# Patient Record
Sex: Female | Born: 1940 | Race: White | Hispanic: No | Marital: Married | Smoking: Former smoker
Health system: Southern US, Community
[De-identification: ages and names within clinical notes are randomized; demographics above are authoritative.]

## PROBLEM LIST (undated history)

## (undated) DIAGNOSIS — J449 Chronic obstructive pulmonary disease, unspecified: Secondary | ICD-10-CM

## (undated) DIAGNOSIS — I471 Supraventricular tachycardia, unspecified: Secondary | ICD-10-CM

## (undated) DIAGNOSIS — I341 Nonrheumatic mitral (valve) prolapse: Secondary | ICD-10-CM

## (undated) DIAGNOSIS — K559 Vascular disorder of intestine, unspecified: Secondary | ICD-10-CM

## (undated) DIAGNOSIS — K589 Irritable bowel syndrome without diarrhea: Secondary | ICD-10-CM

## (undated) DIAGNOSIS — K219 Gastro-esophageal reflux disease without esophagitis: Secondary | ICD-10-CM

## (undated) DIAGNOSIS — C569 Malignant neoplasm of unspecified ovary: Secondary | ICD-10-CM

## (undated) DIAGNOSIS — D696 Thrombocytopenia, unspecified: Secondary | ICD-10-CM

## (undated) DIAGNOSIS — D693 Immune thrombocytopenic purpura: Secondary | ICD-10-CM

## (undated) DIAGNOSIS — A0472 Enterocolitis due to Clostridium difficile, not specified as recurrent: Secondary | ICD-10-CM

## (undated) HISTORY — DX: Supraventricular tachycardia, unspecified: I47.10

## (undated) HISTORY — DX: Nonrheumatic mitral (valve) prolapse: I34.1

## (undated) HISTORY — PX: OTHER SURGICAL HISTORY: SHX169

## (undated) HISTORY — DX: Immune thrombocytopenic purpura: D69.3

## (undated) HISTORY — DX: Vascular disorder of intestine, unspecified: K55.9

## (undated) HISTORY — DX: Supraventricular tachycardia: I47.1

## (undated) HISTORY — DX: Malignant neoplasm of unspecified ovary: C56.9

## (undated) HISTORY — PX: ABDOMINAL HYSTERECTOMY: SHX81

## (undated) HISTORY — DX: Thrombocytopenia, unspecified: D69.6

## (undated) HISTORY — DX: Enterocolitis due to Clostridium difficile, not specified as recurrent: A04.72

## (undated) HISTORY — DX: Gastro-esophageal reflux disease without esophagitis: K21.9

## (undated) HISTORY — PX: CHOLECYSTECTOMY: SHX55

## (undated) HISTORY — DX: Irritable bowel syndrome, unspecified: K58.9

## (undated) HISTORY — DX: Chronic obstructive pulmonary disease, unspecified: J44.9

---

## 2004-04-05 ENCOUNTER — Ambulatory Visit: Payer: Self-pay | Admitting: General Practice

## 2004-08-16 ENCOUNTER — Ambulatory Visit: Payer: Self-pay | Admitting: Unknown Physician Specialty

## 2005-03-06 ENCOUNTER — Encounter: Payer: Self-pay | Admitting: Otolaryngology

## 2005-03-12 ENCOUNTER — Encounter: Payer: Self-pay | Admitting: Otolaryngology

## 2005-08-17 ENCOUNTER — Ambulatory Visit: Payer: Self-pay | Admitting: Unknown Physician Specialty

## 2006-10-29 ENCOUNTER — Ambulatory Visit: Payer: Self-pay | Admitting: Internal Medicine

## 2006-10-31 ENCOUNTER — Ambulatory Visit: Payer: Self-pay | Admitting: Internal Medicine

## 2006-12-04 ENCOUNTER — Ambulatory Visit: Payer: Self-pay | Admitting: *Deleted

## 2007-03-25 ENCOUNTER — Ambulatory Visit: Payer: Self-pay | Admitting: Unknown Physician Specialty

## 2007-05-07 ENCOUNTER — Ambulatory Visit: Payer: Self-pay | Admitting: Internal Medicine

## 2007-10-31 ENCOUNTER — Ambulatory Visit: Payer: Self-pay | Admitting: Internal Medicine

## 2008-06-09 ENCOUNTER — Ambulatory Visit: Payer: Self-pay | Admitting: Medical

## 2008-12-10 ENCOUNTER — Ambulatory Visit: Payer: Self-pay | Admitting: Oncology

## 2009-01-06 ENCOUNTER — Ambulatory Visit: Payer: Self-pay | Admitting: Internal Medicine

## 2009-01-10 ENCOUNTER — Ambulatory Visit: Payer: Self-pay | Admitting: Oncology

## 2009-01-14 ENCOUNTER — Ambulatory Visit: Payer: Self-pay | Admitting: Oncology

## 2009-02-10 ENCOUNTER — Ambulatory Visit: Payer: Self-pay | Admitting: Oncology

## 2009-03-03 ENCOUNTER — Ambulatory Visit: Payer: Self-pay | Admitting: Unknown Physician Specialty

## 2009-03-26 ENCOUNTER — Ambulatory Visit: Payer: Self-pay | Admitting: Surgery

## 2009-04-06 ENCOUNTER — Ambulatory Visit: Payer: Self-pay | Admitting: Unknown Physician Specialty

## 2009-06-12 DIAGNOSIS — A0472 Enterocolitis due to Clostridium difficile, not specified as recurrent: Secondary | ICD-10-CM

## 2009-06-12 HISTORY — DX: Enterocolitis due to Clostridium difficile, not specified as recurrent: A04.72

## 2009-07-13 ENCOUNTER — Ambulatory Visit: Payer: Self-pay | Admitting: Oncology

## 2009-07-15 ENCOUNTER — Ambulatory Visit: Payer: Self-pay | Admitting: Oncology

## 2009-08-10 ENCOUNTER — Ambulatory Visit: Payer: Self-pay | Admitting: Oncology

## 2009-09-06 ENCOUNTER — Ambulatory Visit: Payer: Self-pay | Admitting: Surgery

## 2009-09-13 ENCOUNTER — Ambulatory Visit: Payer: Self-pay | Admitting: Surgery

## 2009-11-17 LAB — HM PAP SMEAR: HM Pap smear: NORMAL

## 2010-02-24 ENCOUNTER — Ambulatory Visit: Payer: Self-pay | Admitting: Internal Medicine

## 2010-05-23 ENCOUNTER — Emergency Department: Payer: Self-pay | Admitting: Emergency Medicine

## 2010-05-27 ENCOUNTER — Inpatient Hospital Stay: Payer: Self-pay | Admitting: Specialist

## 2010-06-12 DIAGNOSIS — K559 Vascular disorder of intestine, unspecified: Secondary | ICD-10-CM

## 2010-06-12 HISTORY — DX: Vascular disorder of intestine, unspecified: K55.9

## 2010-06-15 ENCOUNTER — Inpatient Hospital Stay: Payer: Self-pay | Admitting: Internal Medicine

## 2010-06-17 LAB — PATHOLOGY REPORT

## 2010-06-28 ENCOUNTER — Other Ambulatory Visit: Payer: Self-pay | Admitting: Unknown Physician Specialty

## 2010-07-18 ENCOUNTER — Ambulatory Visit: Payer: Self-pay | Admitting: Unknown Physician Specialty

## 2010-11-22 LAB — HM COLONOSCOPY

## 2010-12-28 ENCOUNTER — Emergency Department: Payer: Self-pay | Admitting: Emergency Medicine

## 2011-01-24 ENCOUNTER — Encounter: Payer: Self-pay | Admitting: Internal Medicine

## 2011-02-22 ENCOUNTER — Encounter: Payer: Self-pay | Admitting: Internal Medicine

## 2011-05-17 ENCOUNTER — Ambulatory Visit: Payer: Self-pay | Admitting: Internal Medicine

## 2011-05-24 ENCOUNTER — Ambulatory Visit (INDEPENDENT_AMBULATORY_CARE_PROVIDER_SITE_OTHER): Payer: Medicare Other | Admitting: Internal Medicine

## 2011-05-24 ENCOUNTER — Encounter: Payer: Self-pay | Admitting: Internal Medicine

## 2011-05-24 VITALS — BP 130/66 | HR 72 | Temp 97.7°F | Wt 137.0 lb

## 2011-05-24 DIAGNOSIS — E785 Hyperlipidemia, unspecified: Secondary | ICD-10-CM

## 2011-05-24 DIAGNOSIS — K589 Irritable bowel syndrome without diarrhea: Secondary | ICD-10-CM

## 2011-05-24 DIAGNOSIS — M810 Age-related osteoporosis without current pathological fracture: Secondary | ICD-10-CM

## 2011-05-24 DIAGNOSIS — G629 Polyneuropathy, unspecified: Secondary | ICD-10-CM

## 2011-05-24 DIAGNOSIS — G43909 Migraine, unspecified, not intractable, without status migrainosus: Secondary | ICD-10-CM | POA: Insufficient documentation

## 2011-05-24 DIAGNOSIS — G589 Mononeuropathy, unspecified: Secondary | ICD-10-CM

## 2011-05-24 DIAGNOSIS — D696 Thrombocytopenia, unspecified: Secondary | ICD-10-CM | POA: Insufficient documentation

## 2011-05-24 DIAGNOSIS — G4700 Insomnia, unspecified: Secondary | ICD-10-CM

## 2011-05-24 LAB — CBC WITH DIFFERENTIAL/PLATELET
Basophils Absolute: 0 10*3/uL (ref 0.0–0.1)
Eosinophils Absolute: 0.2 10*3/uL (ref 0.0–0.7)
HCT: 41.1 % (ref 36.0–46.0)
Hemoglobin: 14 g/dL (ref 12.0–15.0)
Lymphocytes Relative: 38.1 % (ref 12.0–46.0)
Lymphs Abs: 1.7 10*3/uL (ref 0.7–4.0)
MCHC: 34.1 g/dL (ref 30.0–36.0)
MCV: 94.8 fl (ref 78.0–100.0)
Monocytes Absolute: 0.3 10*3/uL (ref 0.1–1.0)
Neutro Abs: 2.2 10*3/uL (ref 1.4–7.7)
RDW: 12.6 % (ref 11.5–14.6)

## 2011-05-24 LAB — COMPREHENSIVE METABOLIC PANEL
ALT: 20 U/L (ref 0–35)
AST: 30 U/L (ref 0–37)
Calcium: 9.9 mg/dL (ref 8.4–10.5)
Chloride: 109 mEq/L (ref 96–112)
Creatinine, Ser: 0.9 mg/dL (ref 0.4–1.2)

## 2011-05-24 MED ORDER — ZOLPIDEM TARTRATE 5 MG PO TABS: 2.5000 mg | ORAL_TABLET | Freq: Every evening | ORAL | Status: DC | PRN

## 2011-05-24 MED ORDER — TOPIRAMATE 25 MG PO TABS: 25.0000 mg | ORAL_TABLET | Freq: Every day | ORAL | Status: DC

## 2011-05-24 MED ORDER — TRAMADOL HCL 50 MG PO TABS: 50.0000 mg | ORAL_TABLET | Freq: Four times a day (QID) | ORAL | Status: DC | PRN

## 2011-05-24 NOTE — Progress Notes (Signed)
Subjective:    Patient ID: Amanda Robbins, female    DOB: January 07, 1941, 70 y.o.   MRN: [id number]  HPI 70 year old female with a history of irritable bowel syndrome, migraines, insomnia presents for followup. Her primary concern today is pain in her bilateral lower extremities. She notes that this has been present for several months. She describes the pain as at the end of pressure or burning around her feet which is made worse with increased activity. She denies any pain in her calves. She notes some numbness in both of her lower legs. She also notes occasional weakness in her left lower extremity and notes that she has strep on several occasions as a result of this. She has not been evaluated by neurology. She was evaluated by a podiatrist who diagnosed neuropathy. She reports that another practitioner had performed some lab work including light level which was normal. She has never had EMG studies. She has not had wounds on her legs. She denies swelling of her lower extremities. She tried a course of Neurontin with no improvement in her symptoms. She also receives samples of Lyrica with no improvement.  She also notes a history of IBS. She was hospitalized for several days last year with a severe episode of IBS. She notes that she has to modify her diet and limited intake of certain foods to help control episodes of diarrhea. She notes that GI has scheduled her for repeat endoscopy next year. She is not currently having any diarrhea or nausea.  In regards to her insomnia, she notes that her symptoms are very well-controlled with Ambien. She requests refill today.  Today, we reviewed her recent bone density testing. This shows an improvement over a two-year period with the use of Reclast. She questions whether there may be alternative medications that she could use instead of the Reclast.  Outpatient Encounter Prescriptions as of 05/24/2011  Medication Sig Dispense Refill  . Calcium Carb-Cholecalciferol  (CALCIUM 1000 + D PO) Take by mouth daily.        Marland Kitchen dexlansoprazole (DEXILANT) 60 MG capsule Take 60 mg by mouth daily.        . Multiple Vitamin (MULTIVITAMIN) tablet Take 1 tablet by mouth daily.        . rizatriptan (MAXALT) 10 MG tablet Take 10 mg by mouth as needed. May repeat in 2 hours if needed       . topiramate (TOPAMAX) 25 MG tablet Take 1-2 tablets (25-50 mg total) by mouth daily.  60 tablet  6  . zolpidem (AMBIEN) 5 MG tablet Take 0.5-1 tablets (2.5-5 mg total) by mouth at bedtime as needed.  30 tablet  3    Review of Systems  Constitutional: Negative for fever, chills, appetite change, fatigue and unexpected weight change.  HENT: Negative for ear pain, congestion, sore throat, trouble swallowing, neck pain, voice change and sinus pressure.   Eyes: Negative for visual disturbance.  Respiratory: Negative for cough, shortness of breath, wheezing and stridor.   Cardiovascular: Negative for chest pain, palpitations and leg swelling.  Gastrointestinal: Positive for abdominal pain (intermittent), diarrhea and constipation. Negative for nausea, vomiting, blood in stool, abdominal distention and anal bleeding.  Genitourinary: Negative for dysuria and flank pain.  Musculoskeletal: Positive for myalgias and arthralgias. Negative for gait problem.  Skin: Negative for color change and rash.  Neurological: Positive for weakness and numbness. Negative for dizziness and headaches.  Hematological: Negative for adenopathy. Does not bruise/bleed easily.  Psychiatric/Behavioral: Negative for suicidal ideas, sleep  disturbance and dysphoric mood. The patient is not nervous/anxious.        Objective:   Physical Exam  Constitutional: She is oriented to person, place, and time. She appears well-developed and well-nourished. No distress.  HENT:  Head: Normocephalic and atraumatic.  Right Ear: External ear normal.  Left Ear: External ear normal.  Nose: Nose normal.  Mouth/Throat: Oropharynx is  clear and moist. No oropharyngeal exudate.  Eyes: Conjunctivae are normal. Pupils are equal, round, and reactive to light. Right eye exhibits no discharge. Left eye exhibits no discharge. No scleral icterus.  Neck: Normal range of motion. Neck supple. No tracheal deviation present. No thyromegaly present.  Cardiovascular: Normal rate, regular rhythm, normal heart sounds and intact distal pulses.  Exam reveals no gallop and no friction rub.   No murmur heard. Pulmonary/Chest: Effort normal and breath sounds normal. No respiratory distress. She has no wheezes. She has no rales. She exhibits no tenderness.  Abdominal: Soft. Bowel sounds are normal. She exhibits no distension and no mass. There is no tenderness. There is no rebound and no guarding.  Musculoskeletal: Normal range of motion. She exhibits no edema and no tenderness.  Lymphadenopathy:    She has no cervical adenopathy.  Neurological: She is alert and oriented to person, place, and time. She displays no atrophy. A sensory deficit (light touch limited distal left lower leg) is present. No cranial nerve deficit. She exhibits normal muscle tone. Coordination and gait normal.  Skin: Skin is warm and dry. No rash noted. She is not diaphoretic. No erythema. No pallor.  Psychiatric: She has a normal mood and affect. Her behavior is normal. Judgment and thought content normal.          Assessment & Plan:  1. Neuropathy - symptoms and exam are concerning for progressive neuropathy. Will set up referral to neurology for EMG testing. She has tried both Neurontin and Lyrica in the past with no improvement. She has had some improvement with tramadol so will give refill on this medication today. She will followup here in 3 months.   2. IBS - Symptoms have improved compared with last year. Will continue to monitor. Will obtain records from GI.  3. Insomnia - Symptoms well controlled on ambien.  Refill on ambien today.  4. Osteoporosis - reviewed  recent bone density testing. Will look into her insurance coverage for Prolia. If she is approved, we'll start Prolia injection as soon as possible. She will need repeat bone density studies in 2014.

## 2011-05-29 ENCOUNTER — Ambulatory Visit (INDEPENDENT_AMBULATORY_CARE_PROVIDER_SITE_OTHER): Payer: Medicare Other | Admitting: Neurology

## 2011-05-29 ENCOUNTER — Other Ambulatory Visit (INDEPENDENT_AMBULATORY_CARE_PROVIDER_SITE_OTHER): Payer: Medicare Other

## 2011-05-29 ENCOUNTER — Encounter: Payer: Self-pay | Admitting: Neurology

## 2011-05-29 ENCOUNTER — Other Ambulatory Visit: Payer: Self-pay | Admitting: Neurology

## 2011-05-29 DIAGNOSIS — R7309 Other abnormal glucose: Secondary | ICD-10-CM

## 2011-05-29 DIAGNOSIS — G609 Hereditary and idiopathic neuropathy, unspecified: Secondary | ICD-10-CM

## 2011-05-29 LAB — COMPREHENSIVE METABOLIC PANEL
Alkaline Phosphatase: 52 U/L (ref 39–117)
BUN: 22 mg/dL (ref 6–23)
Glucose, Bld: 102 mg/dL — ABNORMAL HIGH (ref 70–99)
Total Bilirubin: 0.9 mg/dL (ref 0.3–1.2)

## 2011-05-29 LAB — CBC WITH DIFFERENTIAL/PLATELET
Basophils Relative: 0.4 % (ref 0.0–3.0)
Eosinophils Relative: 3.6 % (ref 0.0–5.0)
HCT: 40 % (ref 36.0–46.0)
Lymphs Abs: 1.5 10*3/uL (ref 0.7–4.0)
MCHC: 35.2 g/dL (ref 30.0–36.0)
MCV: 94.2 fl (ref 78.0–100.0)
Monocytes Absolute: 0.2 10*3/uL (ref 0.1–1.0)
Platelets: 119 10*3/uL — ABNORMAL LOW (ref 150.0–400.0)
RBC: 4.25 Mil/uL (ref 3.87–5.11)
WBC: 4 10*3/uL — ABNORMAL LOW (ref 4.5–10.5)

## 2011-05-29 LAB — VITAMIN B12: Vitamin B-12: 1016 pg/mL — ABNORMAL HIGH (ref 211–911)

## 2011-05-29 LAB — SEDIMENTATION RATE: Sed Rate: 10 mm/hr (ref 0–22)

## 2011-05-29 LAB — HEMOGLOBIN A1C: Hgb A1c MFr Bld: 5.3 % (ref 4.6–6.5)

## 2011-05-29 LAB — TSH: TSH: 2.28 u[IU]/mL (ref 0.35–5.50)

## 2011-05-29 MED ORDER — GABAPENTIN 300 MG PO CAPS: ORAL_CAPSULE | ORAL | Status: DC

## 2011-05-29 NOTE — Progress Notes (Signed)
Dear Dr. Dan Humphreys,  Thank you for having me see Amanda Robbins in consultation today at [facility] for her problem with possible peripheral neuropathy.  As you may recall, she is a 70 y.o. 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  She feels it started in the right foot and progressed to the left.  She says that she gets some numbness in her right hand, but has attributed this to a recent wrist fracture.  She tried Neurontin 100 tid but did not get any relief.  She did not try a prescription for Lyrica due to its listed side effects.  She denies dysesthesias or paraesthesias.  She feels disabled by the neuropathy as it makes it hard for her to walk for long periods of time.    Past Medical History  Diagnosis Date  . 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 Irritable bowel   . C. difficile colitis 2011  . Allergic rhinitis     Followed by Dr. Ray Callas  . Esophageal reflux     Followed by GI physician, on Dexilant and Ranitidine  . Migraine   . 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 Osteoporosis 2009    on Reclast once yearly, Followed by Dr. Gavin Potters  - no history of diabetes  Past Surgical History  Procedure Date  . Bil roken wrists   . Gallbladder removed   . Bilateral bunionectomies   . Cesarean section     History   Social History  . Marital Status: Married    Spouse Name: N/A    Number of Children: N/A  . Years of Education: N/A   Social History Main Topics  . Smoking status: Former Smoker    Quit date: 06/12/1989  . Smokeless tobacco: Never Used  . Alcohol Use: No  . Drug Use: No  . Sexually Active: None   Other Topics Concern  . None   Social History Narrative  . None   - no history of neuropathies in the family.  Current Outpatient Prescriptions on File Prior to Visit  Medication Sig  Dispense Refill  . Calcium Carb-Cholecalciferol (CALCIUM 1000 + D PO) Take by mouth daily.        Marland Kitchen dexlansoprazole (DEXILANT) 60 MG capsule Take 60 mg by mouth daily.        Marland Kitchen gabapentin (NEURONTIN) 100 MG capsule Take 100 mg by mouth 3 (three) times daily as needed.        . Multiple Vitamin (MULTIVITAMIN) tablet Take 1 tablet by mouth daily.        . rizatriptan (MAXALT) 10 MG tablet Take 10 mg by mouth as needed. May repeat in 2 hours if needed       . topiramate (TOPAMAX) 25 MG tablet Take 1-2 tablets (25-50 mg total) by mouth daily.  60 tablet  6  . traMADol (ULTRAM) 50 MG tablet Take 1 tablet (50 mg total) by mouth every 6 (six) hours as needed.  30 tablet  6  . zoledronic acid (RECLAST) 5 MG/100ML SOLN Inject 5 mg into the vein once. yearly       . zolpidem (AMBIEN) 5 MG tablet Take 0.5-1 tablets (2.5-5 mg total) by mouth at bedtime as needed.  30 tablet  3    Allergies  Allergen Reactions  .  Advil   . Aleve   . Aspirin   . Codeine Itching  . Nsaids     GI UPSET / GERD      ROS:  13 systems were reviewed and are notable for intermittent abdominal pain.  All other review of systems are unremarkable.   Examination:  Filed Vitals:   05/29/11 1217  BP: 126/70  Pulse: 66  Weight: 138 lb 4.8 oz (62.732 kg)     In general, well appearing older women.  Cardiovascular: The patient has a regular rate and rhythm and no carotid bruits.  Fundoscopy:  Disks are flat. Vessel caliber within normal limits.  Mental status:   The patient is oriented to person, place and time. Recent and remote memory are intact. Attention span and concentration are normal. Language including repetition, naming, following commands are intact. Fund of knowledge of current and historical events, as well as vocabulary are normal.  Cranial Nerves: Pupils are equally round and reactive to light. Visual fields full to confrontation. Extraocular movements are intact without nystagmus. Facial sensation  and muscles of mastication are intact. Muscles of facial expression are symmetric. Hearing intact to bilateral finger rub. Tongue protrusion, uvula, palate midline.  Shoulder shrug intact  Motor:  The patient has normal bulk and tone, no pronator drift.  There are no adventitious movements.  5/5 bilaterally.  Reflexes:   Biceps  Triceps Brachioradialis Knee Ankle  Right 2+  2+  2+   3+ 1+  Left  2+  2+  2+   3+ 1+  Toes down  Coordination:  Normal finger to nose.  No dysdiadokinesia.  Sensation is decreased in a length dependent pattern to mid calf/mid forearm to temperature.  Vibration reduced in the feet, seems normal in the hands.  Position sense decreased in the feet.  Gait and Station are normal.  Romberg is negative   Impression/Recs: ?distal symmetric peripheral neuropathy - I am going to get an EMG/NCS of her RUE and LLE.  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  Also will get basic screening labs for PN.  I have suggested increasing her gabapentin to 300 tid to see if this helps, although I am less convinced it will give then absence of dysethesias or paraesthesias.   We will see the patient back in 6 weeks.  Thank you for having [place] see Amanda Robbins in consultation.  Feel free to contact me with any questions.  Lupita Raider Modesto Charon, MD [facility], [place] [address] [place], [place] [address] Phone: [phone] Fax: [phone].

## 2011-05-29 NOTE — Patient Instructions (Addendum)
Please start Neurontin 300mg  at night. In 7 days increase to 1 tab twice a day, then in 7 days increase to 1 tab three times a day.  Go to the basement to have your labs drawn today.  Your NCV/EMG is scheduled at [facility] located at [address] in [place]. [phone] on Friday January 4th at 1:45 pm. [phone].  Follow up with Dr. Modesto Charon on Jan., 28th at 2:30 pm.

## 2011-05-31 LAB — SPEP & IFE WITH QIG
Beta 2: 4.5 % (ref 3.2–6.5)
Beta Globulin: 6.8 % (ref 4.7–7.2)
Gamma Globulin: 16 % (ref 11.1–18.8)
IgA: 196 mg/dL (ref 69–380)
IgG (Immunoglobin G), Serum: 1130 mg/dL (ref 690–1700)

## 2011-06-08 ENCOUNTER — Encounter: Payer: Self-pay | Admitting: Internal Medicine

## 2011-06-09 NOTE — Progress Notes (Signed)
Quick Note:  Called and spoke with the patient. Information given as per Dr. Modesto Charon re: lab results. No concerns/questions at this time. ______

## 2011-06-22 ENCOUNTER — Telehealth: Payer: Self-pay | Admitting: Neurology

## 2011-06-22 ENCOUNTER — Telehealth: Payer: Self-pay | Admitting: *Deleted

## 2011-06-22 DIAGNOSIS — G629 Polyneuropathy, unspecified: Secondary | ICD-10-CM

## 2011-06-22 NOTE — Telephone Encounter (Signed)
Message copied by Benay Spice on Thu Jun 22, 2011  1:45 PM ------      Message from: Milas Gain      Created: Thu Jun 22, 2011  1:03 PM       Could you let Ms. Jonsson know her NCS/EMG was normal?

## 2011-06-22 NOTE — Telephone Encounter (Signed)
Called and spoke with the patient. Information given as directed by Dr. Modesto Charon re: normal NCV/EMG. The patient had no additional concerns.

## 2011-06-22 NOTE — Telephone Encounter (Signed)
Pt left VM - EMG showed no neuropathy. She would like referral to "foot MD"

## 2011-06-22 NOTE — Telephone Encounter (Signed)
Would recommend [facility], Dr. Irving Shows

## 2011-06-22 NOTE — Telephone Encounter (Signed)
I spoke w/pt. She has been seeing Dr Al Corpus at [facility]. She would like referral to another practice. Who do you suggest?

## 2011-06-23 NOTE — Telephone Encounter (Signed)
Referral entered  

## 2011-06-23 NOTE — Telephone Encounter (Signed)
There is Dr. Ether Griffins at [facility]

## 2011-06-28 ENCOUNTER — Encounter: Payer: Self-pay | Admitting: Neurology

## 2011-07-10 ENCOUNTER — Encounter: Payer: Self-pay | Admitting: Neurology

## 2011-07-10 ENCOUNTER — Ambulatory Visit (INDEPENDENT_AMBULATORY_CARE_PROVIDER_SITE_OTHER): Payer: Medicare Other | Admitting: Neurology

## 2011-07-10 VITALS — BP 118/64 | HR 80 | Wt 136.0 lb

## 2011-07-10 DIAGNOSIS — G589 Mononeuropathy, unspecified: Secondary | ICD-10-CM

## 2011-07-10 DIAGNOSIS — G629 Polyneuropathy, unspecified: Secondary | ICD-10-CM

## 2011-07-10 MED ORDER — AMITRIPTYLINE HCL 25 MG PO TABS: ORAL_TABLET | ORAL | Status: DC

## 2011-07-10 NOTE — Progress Notes (Signed)
Dear Dr. Dan Humphreys,  I saw  Amanda Robbins back in [place] [facility] for her problem with painful feet.  As you may recall, she is a 71 y.o. year old female with a history of migraine who has had pain in her right 2nd and 3rd toes as well as a band like sensation around her left foot.  I felt based on exam she likely had a mild neuropathy.  PN labs were normal.  EMG/NCS showed a borderline sural amplitude, which still could be signs of a neuropathy, particularly if her amplitudes were much higher previously.  She stopped her gabapentin because she was told she didn't have a neuropathy based on NCS.  She could not tolerated the higher dose of 300 bid I prescribed.  Medical history, social history, and family history were reviewed and have not changed since the last clinic visit.  Current Outpatient Prescriptions on File Prior to Visit  Medication Sig Dispense Refill  . Calcium Carb-Cholecalciferol (CALCIUM 1000 + D PO) Take by mouth daily.        Marland Kitchen dexlansoprazole (DEXILANT) 60 MG capsule Take 60 mg by mouth daily.        .      .      . Multiple Vitamin (MULTIVITAMIN) tablet Take 1 tablet by mouth daily.        . rizatriptan (MAXALT) 10 MG tablet Take 10 mg by mouth as needed. May repeat in 2 hours if needed       . topiramate (TOPAMAX) 25 MG tablet Take 1-2 tablets (25-50 mg total) by mouth daily.  60 tablet  6  . traMADol (ULTRAM) 50 MG tablet Take 1 tablet (50 mg total) by mouth every 6 (six) hours as needed.  30 tablet  6  . zoledronic acid (RECLAST) 5 MG/100ML SOLN Inject 5 mg into the vein once. yearly       . zolpidem (AMBIEN) 5 MG tablet Take 0.5-1 tablets (2.5-5 mg total) by mouth at bedtime as needed.  30 tablet  3    Allergies  Allergen Reactions  . Advil   . Aleve   . Aspirin   . Codeine Itching  . Nsaids     GI UPSET / GERD    ROS:  13 systems were reviewed and are notable for complaints of aching in her toes.  All other review of systems are  unremarkable.  Exam: . Filed Vitals:   07/10/11 1431  BP: 118/64  Pulse: 80  Weight: 136 lb (61.689 kg)     Impression/Recommendations: Possible neuropathy, too mild to meet electrical criteria.  I will start her on Elavil 12.5->25mg .  This can be increased as tolerated.  I am not optimistic her pain will respond to this given the lack of positive phenomenon(tingling and burning).  We will see the patient back in 3 months.  Amanda Robbins Modesto Charon, MD [facility], [place]

## 2011-08-22 ENCOUNTER — Encounter: Payer: Self-pay | Admitting: Internal Medicine

## 2011-08-22 ENCOUNTER — Ambulatory Visit (INDEPENDENT_AMBULATORY_CARE_PROVIDER_SITE_OTHER): Payer: Medicare Other | Admitting: Internal Medicine

## 2011-08-22 VITALS — BP 100/60 | HR 98 | Temp 97.3°F | Wt 138.0 lb

## 2011-08-22 DIAGNOSIS — G4700 Insomnia, unspecified: Secondary | ICD-10-CM

## 2011-08-22 DIAGNOSIS — M810 Age-related osteoporosis without current pathological fracture: Secondary | ICD-10-CM

## 2011-08-22 DIAGNOSIS — K219 Gastro-esophageal reflux disease without esophagitis: Secondary | ICD-10-CM

## 2011-08-22 DIAGNOSIS — E785 Hyperlipidemia, unspecified: Secondary | ICD-10-CM

## 2011-08-22 DIAGNOSIS — G629 Polyneuropathy, unspecified: Secondary | ICD-10-CM

## 2011-08-22 DIAGNOSIS — G589 Mononeuropathy, unspecified: Secondary | ICD-10-CM

## 2011-08-22 DIAGNOSIS — D696 Thrombocytopenia, unspecified: Secondary | ICD-10-CM

## 2011-08-22 MED ORDER — AMITRIPTYLINE HCL 25 MG PO TABS: 25.0000 mg | ORAL_TABLET | Freq: Every day | ORAL | Status: DC

## 2011-08-22 MED ORDER — PANTOPRAZOLE SODIUM 40 MG PO TBEC: 40.0000 mg | DELAYED_RELEASE_TABLET | Freq: Every day | ORAL | Status: AC

## 2011-08-22 NOTE — Assessment & Plan Note (Addendum)
Improved with the use of Ambien. Will continue.

## 2011-08-22 NOTE — Assessment & Plan Note (Signed)
Symptoms have improved with use of amitriptyline. Patient has followup with neurology next month. We'll continue to monitor.

## 2011-08-22 NOTE — Progress Notes (Signed)
Subjective:    Patient ID: Amanda Robbins, female    DOB: Nov 09, 1940, 71 y.o.   MRN: [id number]  HPI 71 year old female with history of irritable bowel syndrome, thrombocytopenia, neuropathy, GERD presents for followup. She reports she is doing well. She notes that she recently had EMG testing which was normal. She was also seen by neurology. She was started on amitriptyline to help with pain in her feet. She reports significant improvement with this medication.  In regards to her irritable bowel syndrome and GERD, she notes that she has been using Protonix with improvement in her symptoms. She only uses this on occasion. She's been doing quite well and has not had episodes of diarrhea as she was having in the past.  Outpatient Encounter Prescriptions as of 08/22/2011  Medication Sig Dispense Refill  . alum & mag hydroxide-simeth (MAALOX/MYLANTA) 200-200-20 MG/5ML suspension Take by mouth every 6 (six) hours as needed.      Marland Kitchen amitriptyline (ELAVIL) 25 MG tablet Take 1 tablet (25 mg total) by mouth at bedtime.  30 tablet  6  . Calcium Carb-Cholecalciferol (CALCIUM 1000 + D PO) Take by mouth daily.        . Multiple Vitamin (MULTIVITAMIN) tablet Take 1 tablet by mouth daily.        . rizatriptan (MAXALT) 10 MG tablet Take 10 mg by mouth as needed. May repeat in 2 hours if needed       . topiramate (TOPAMAX) 25 MG tablet Take 1-2 tablets (25-50 mg total) by mouth daily.  60 tablet  6  . traMADol (ULTRAM) 50 MG tablet Take 1 tablet (50 mg total) by mouth every 6 (six) hours as needed.  30 tablet  6  . zoledronic acid (RECLAST) 5 MG/100ML SOLN Inject 5 mg into the vein once. yearly       . zolpidem (AMBIEN) 5 MG tablet Take 0.5-1 tablets (2.5-5 mg total) by mouth at bedtime as needed.  30 tablet  3  . pantoprazole (PROTONIX) 40 MG tablet Take 1 tablet (40 mg total) by mouth daily.  30 tablet  3  . DISCONTD: dexlansoprazole (DEXILANT) 60 MG capsule Take 60 mg by mouth daily.          Review of Systems    Constitutional: Negative for fever, chills, appetite change, fatigue and unexpected weight change.  HENT: Negative for ear pain, congestion, sore throat, trouble swallowing, neck pain, voice change and sinus pressure.   Eyes: Negative for visual disturbance.  Respiratory: Negative for cough, shortness of breath, wheezing and stridor.   Cardiovascular: Negative for chest pain, palpitations and leg swelling.  Gastrointestinal: Negative for nausea, vomiting, abdominal pain, diarrhea, constipation, blood in stool, abdominal distention and anal bleeding.  Genitourinary: Negative for dysuria and flank pain.  Musculoskeletal: Positive for myalgias. Negative for arthralgias and gait problem.  Skin: Negative for color change and rash.  Neurological: Negative for dizziness and headaches.  Hematological: Negative for adenopathy. Does not bruise/bleed easily.  Psychiatric/Behavioral: Negative for suicidal ideas, sleep disturbance and dysphoric mood. The patient is not nervous/anxious.    BP 100/60  Pulse 98  Temp(Src) 97.3 F (36.3 C) (Oral)  Wt 138 lb (62.596 kg)  SpO2 98%     Objective:   Physical Exam  Constitutional: She is oriented to person, place, and time. She appears well-developed and well-nourished. No distress.  HENT:  Head: Normocephalic and atraumatic.  Right Ear: External ear normal.  Left Ear: External ear normal.  Nose: Nose normal.  Mouth/Throat:  Oropharynx is clear and moist. No oropharyngeal exudate.  Eyes: Conjunctivae are normal. Pupils are equal, round, and reactive to light. Right eye exhibits no discharge. Left eye exhibits no discharge. No scleral icterus.  Neck: Normal range of motion. Neck supple. No tracheal deviation present. No thyromegaly present.  Cardiovascular: Normal rate, regular rhythm, normal heart sounds and intact distal pulses.  Exam reveals no gallop and no friction rub.   No murmur heard. Pulmonary/Chest: Effort normal and breath sounds normal. No  respiratory distress. She has no wheezes. She has no rales. She exhibits no tenderness.  Abdominal: Soft. Bowel sounds are normal. She exhibits no distension and no mass. There is no tenderness. There is no rebound and no guarding.  Musculoskeletal: Normal range of motion. She exhibits no edema and no tenderness.  Lymphadenopathy:    She has no cervical adenopathy.  Neurological: She is alert and oriented to person, place, and time. No cranial nerve deficit. She exhibits normal muscle tone. Coordination normal.  Skin: Skin is warm and dry. No rash noted. She is not diaphoretic. No erythema. No pallor.  Psychiatric: She has a normal mood and affect. Her behavior is normal. Judgment and thought content normal.          Assessment & Plan:

## 2011-08-22 NOTE — Assessment & Plan Note (Signed)
We'll continue to use Protonix as needed.

## 2011-08-22 NOTE — Assessment & Plan Note (Signed)
Will look into coverage for Prolia.

## 2011-08-22 NOTE — Assessment & Plan Note (Signed)
Platelets have been stable. Will recheck CBC today.

## 2011-08-28 ENCOUNTER — Encounter: Payer: Self-pay | Admitting: Internal Medicine

## 2011-09-22 ENCOUNTER — Telehealth: Payer: Self-pay | Admitting: *Deleted

## 2011-09-22 ENCOUNTER — Encounter: Payer: Self-pay | Admitting: Internal Medicine

## 2011-09-22 NOTE — Telephone Encounter (Signed)
Patient was advised to go to UC. See note from CALL A NURSE

## 2011-09-22 NOTE — Telephone Encounter (Signed)
Triage Record Num: [id number] Operator: Lyn Hollingshead Patient Name: Amanda Robbins Call Date & Time: 09/22/2011 3:17:04PM Patient Phone: [phone] PCP: Ronna Polio Patient Gender: Female PCP Fax : [phone] Patient DOB: [birth year] Practice Name: [facility] Reason for Call: Caller: Winter/Patient; PCP: Ronna Polio; CB#: [phone]; ; ; Call regarding Urinary Pain; Onset- 09/22/11 Afebrile. Pt c/o of dysuria and low back pain. Emergent s/s of Urinary Symptoms r/o. Pt to see provider within 4hours. Per Erie Noe, nurse wants pt to go to UC. Protocol(s) Used: Urinary Symptoms - Female Recommended Outcome per Protocol: See Provider within 4 hours Reason for Outcome: Urinary tract symptoms AND any flank or low back pain Care Advice: ~ Call provider if symptoms worsen, such as increasing pain in low back, pelvis, or side(s); blood in urine; or fever. Increase intake of fluids. Try to drink 8 oz. (.2 liter) every hour when awake, including unsweetened cranberry juice, unless on restricted fluids for other medical reasons. Take sips of fluid or eat ice chips if nauseated or vomiting. ~ ~ SYMPTOM / CONDITION MANAGEMENT Limit carbonated, alcoholic, and caffeinated beverages such as coffee, tea and soda. Avoid nonprescription cold and allergy medications that contain caffeine. Limit intake of tomatoes, fruit juices (except for unsweetened cranberry juice), dairy products, spicy foods, sugar, and artificial sweeteners (aspartame or saccharine). Stop or decrease smoking. Reducing exposure to bladder irritants may help lessen urgency. ~ Analgesic/Antipyretic Advice - Acetaminophen: Consider acetaminophen as directed on label or by pharmacist/provider for pain or fever PRECAUTIONS: - Use if there is no history of liver disease, alcoholism, or intake of three or more alcohol drinks per day - Only if approved by provider during pregnancy or when breastfeeding - During  pregnancy, acetaminophen should not be taken more than 3 consecutive days without telling provider - Do not exceed recommended dose or frequency ~ 04/

## 2011-09-27 ENCOUNTER — Telehealth: Payer: Self-pay | Admitting: Internal Medicine

## 2011-09-27 NOTE — Telephone Encounter (Signed)
I just got a fax back from [facility] stating she has faxed the information to the insurance company.

## 2011-09-27 NOTE — Telephone Encounter (Signed)
I have sent an email to Ruby to find out the status. I also had an email from Maralyn Sago that she sent this to [employer] in December.

## 2011-09-27 NOTE — Telephone Encounter (Signed)
Message copied by Lysbeth Galas on Wed Sep 27, 2011 11:33 AM ------      Message from: Ronna Polio A      Created: Tue Aug 22, 2011  1:00 PM      Regarding: Prolia       Need to look into Prolia coverage for this patient.

## 2011-10-09 ENCOUNTER — Ambulatory Visit (INDEPENDENT_AMBULATORY_CARE_PROVIDER_SITE_OTHER): Payer: Medicare Other | Admitting: Neurology

## 2011-10-09 ENCOUNTER — Encounter: Payer: Self-pay | Admitting: Neurology

## 2011-10-09 VITALS — BP 100/60 | HR 92 | Wt 140.0 lb

## 2011-10-09 DIAGNOSIS — G629 Polyneuropathy, unspecified: Secondary | ICD-10-CM

## 2011-10-09 DIAGNOSIS — G589 Mononeuropathy, unspecified: Secondary | ICD-10-CM

## 2011-10-09 NOTE — Progress Notes (Signed)
Dear Dr. Dan Humphreys,  Thank you for having me see Amanda Robbins in consultation today at [facility] for her problem a possible idiopathic peripheral neuropathy.  As you may recall, she is a 71 y.o. year old female with a history of numbness in her toes as well as uncomfortable squeezing sensation in her foot, who has borderline low sural amplitudes.  At her last visit I started her on Elavil 25mg  qhs.  This has helped her significantly.  She is able to walk without pain.  She still has the sensation of numbness in her feet but this is not bothersome.  Past Medical History  Diagnosis Date  . 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 Irritable bowel   . C. difficile colitis 2011  . Allergic rhinitis     Followed by Dr. [place] Callas  . Esophageal reflux     Followed by GI physician, on Dexilant and Ranitidine  . Migraine   . 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 Osteoporosis 2009    on Reclast once yearly, Followed by Dr. Gavin Potters    Past Surgical History  Procedure Date  . Bil roken wrists   . Gallbladder removed   . Bilateral bunionectomies   . Cesarean section     History   Social History  . Marital Status: Married    Spouse Name: N/A    Number of Children: N/A  . Years of Education: N/A   Social History Main Topics  . Smoking status: Former Smoker    Quit date: 06/12/1989  . Smokeless tobacco: Never Used  . Alcohol Use: No  . Drug Use: No  . Sexually Active: None   Other Topics Concern  . None   Social History Narrative  . None    No family history on file.  Current Outpatient Prescriptions on File Prior to Visit  Medication Sig Dispense Refill  . alum & mag hydroxide-simeth (MAALOX/MYLANTA) 200-200-20 MG/5ML suspension Take by mouth every 6 (six) hours as needed.      Marland Kitchen amitriptyline (ELAVIL) 25 MG tablet Take 1 tablet (25 mg total) by mouth at bedtime.  30 tablet  6  . Calcium Carb-Cholecalciferol (CALCIUM 1000 + D PO) Take by  mouth daily.        . Multiple Vitamin (MULTIVITAMIN) tablet Take 1 tablet by mouth daily.        . pantoprazole (PROTONIX) 40 MG tablet Take 1 tablet (40 mg total) by mouth daily.  30 tablet  3  . rizatriptan (MAXALT) 10 MG tablet Take 10 mg by mouth as needed. May repeat in 2 hours if needed       . topiramate (TOPAMAX) 25 MG tablet Take 1-2 tablets (25-50 mg total) by mouth daily.  60 tablet  6  . traMADol (ULTRAM) 50 MG tablet Take 1 tablet (50 mg total) by mouth every 6 (six) hours as needed.  30 tablet  6  . zolpidem (AMBIEN) 5 MG tablet Take 0.5-1 tablets (2.5-5 mg total) by mouth at bedtime as needed.  30 tablet  3  . zoledronic acid (RECLAST) 5 MG/100ML SOLN Inject 5 mg into the vein once. yearly       . DISCONTD: dexlansoprazole (DEXILANT) 60 MG capsule Take 60 mg by mouth daily.        Marland Kitchen DISCONTD: gabapentin (NEURONTIN) 300 MG capsule start with 1 tab at night for 1 week, then increase as directed.  90 capsule  11  Allergies  Allergen Reactions  . Aspirin   . Codeine Itching  . Ibuprofen   . Naproxen Sodium   . Nsaids     GI UPSET / GERD      ROS:  13 systems were reviewed and are unremarkable.   Examination:  Filed Vitals:   10/09/11 1427  BP: 100/60  Pulse: 92  Weight: 140 lb (63.504 kg)     In general, well appearing women.    Motor:  The patient has normal bulk and tone, no pronator drift.  There are no adventitious movements.  5/5 muscle strength bilaterally.  Reflexes:   Biceps  Triceps Brachioradialis Knee Ankle  Right 2+  2+  2+   2+ 0  Left  2+  2+  2+   2+ 0  Toes down  Coordination:  Normal finger to nose.  No dysdiadokinesia.  Sensation is decreased to temperature in U and L extremities, to mid calf and mid forearm.  Vibration decreased at foot.  Position sense intact.  Gait and Station are normal.  Tandem gait is intact.  Romberg is negative   Impression/Recs: 1.  Idiopathic mild peripheral neuropathy - Continue Elavil 25mg   qhs.   We will see the patient back in 6 months.  Thank you for having [place] see Amanda Robbins in consultation.  Feel free to contact me with any questions.  Lupita Raider Modesto Charon, MD [facility], [place] [address] [place], [place] [address] Phone: [phone] Fax: [phone].

## 2011-11-22 ENCOUNTER — Encounter: Payer: Self-pay | Admitting: Internal Medicine

## 2011-11-22 ENCOUNTER — Ambulatory Visit (INDEPENDENT_AMBULATORY_CARE_PROVIDER_SITE_OTHER): Payer: Medicare Other | Admitting: Internal Medicine

## 2011-11-22 VITALS — BP 120/80 | HR 64 | Temp 98.2°F | Ht 65.75 in | Wt 140.2 lb

## 2011-11-22 DIAGNOSIS — G4700 Insomnia, unspecified: Secondary | ICD-10-CM

## 2011-11-22 DIAGNOSIS — D696 Thrombocytopenia, unspecified: Secondary | ICD-10-CM

## 2011-11-22 DIAGNOSIS — G589 Mononeuropathy, unspecified: Secondary | ICD-10-CM

## 2011-11-22 DIAGNOSIS — M810 Age-related osteoporosis without current pathological fracture: Secondary | ICD-10-CM

## 2011-11-22 DIAGNOSIS — Z23 Encounter for immunization: Secondary | ICD-10-CM

## 2011-11-22 DIAGNOSIS — Z0000 Encounter for general adult medical examination without abnormal findings: Secondary | ICD-10-CM

## 2011-11-22 DIAGNOSIS — E785 Hyperlipidemia, unspecified: Secondary | ICD-10-CM

## 2011-11-22 DIAGNOSIS — R05 Cough: Secondary | ICD-10-CM | POA: Insufficient documentation

## 2011-11-22 DIAGNOSIS — G629 Polyneuropathy, unspecified: Secondary | ICD-10-CM

## 2011-11-22 DIAGNOSIS — R059 Cough, unspecified: Secondary | ICD-10-CM

## 2011-11-22 LAB — COMPREHENSIVE METABOLIC PANEL
AST: 32 U/L (ref 0–37)
Alkaline Phosphatase: 55 U/L (ref 39–117)
BUN: 16 mg/dL (ref 6–23)
Glucose, Bld: 82 mg/dL (ref 70–99)
Sodium: 140 mEq/L (ref 135–145)
Total Bilirubin: 0.6 mg/dL (ref 0.3–1.2)
Total Protein: 7.2 g/dL (ref 6.0–8.3)

## 2011-11-22 LAB — LIPID PANEL
Cholesterol: 176 mg/dL (ref 0–200)
LDL Cholesterol: 89 mg/dL (ref 0–99)
Triglycerides: 66 mg/dL (ref 0.0–149.0)
VLDL: 13.2 mg/dL (ref 0.0–40.0)

## 2011-11-22 LAB — CBC WITH DIFFERENTIAL/PLATELET
Basophils Absolute: 0 10*3/uL (ref 0.0–0.1)
Eosinophils Absolute: 0.1 10*3/uL (ref 0.0–0.7)
Lymphocytes Relative: 48 % — ABNORMAL HIGH (ref 12.0–46.0)
MCHC: 34.1 g/dL (ref 30.0–36.0)
Neutrophils Relative %: 39.4 % — ABNORMAL LOW (ref 43.0–77.0)
RDW: 13 % (ref 11.5–14.6)

## 2011-11-22 MED ORDER — HYDROCOD POLST-CHLORPHEN POLST 10-8 MG/5ML PO LQCR: 5.0000 mL | Freq: Every evening | ORAL | Status: DC | PRN

## 2011-11-22 MED ORDER — ZOLPIDEM TARTRATE 5 MG PO TABS: 2.5000 mg | ORAL_TABLET | Freq: Every evening | ORAL | Status: DC | PRN

## 2011-11-22 NOTE — Assessment & Plan Note (Signed)
Awaiting information on insurance coverage for [employer].

## 2011-11-22 NOTE — Progress Notes (Signed)
Subjective:    Patient ID: Amanda Robbins, female    DOB: 1940-07-23, 71 y.o.   MRN: [id number]  HPI The patient is here for annual Medicare wellness examination and management of other chronic and acute problems.   The risk factors are reflected in the social history.  The roster of all physicians providing medical care to patient - is listed in the Snapshot section of the chart.  Activities of daily living:  The patient is 100% independent in all ADLs: dressing, toileting, feeding as well as independent mobility  Home safety : The patient has smoke detectors in the home. They wear seatbelts.  There are firearms at home. There is no violence in the home.   There is no risks for hepatitis, STDs or HIV. There is no history of blood transfusion. They have no travel history to infectious disease endemic areas of the world.  The patient has seen their dentist in the last six month (Dr. Joesphine Bare). They have seen their eye doctor in the last year (Dr. Fransico Michael). No hearing issues.  They do not have excessive sun exposure. Discussed the need for sun protection: hats, long sleeves and use of sunscreen if there is significant sun exposure.   Diet: the importance of a healthy diet is discussed. They do have a healthy diet.  The benefits of regular aerobic exercise were discussed. She uses elliptical and bands 3-5 days per week.  Depression screen: there are no signs or vegative symptoms of depression- irritability, change in appetite, anhedonia, sadness/tearfullness.  Cognitive assessment: the patient manages all their financial and personal affairs and is actively engaged. They could relate day,date,year and events.  The following portions of the patient's history were reviewed and updated as appropriate: allergies, current medications, past family history, past medical history,  past surgical history, past social history  and problem list.  Visual acuity was not assessed per patient preference  since she has regular follow up with her ophthalmologist. Hearing and body mass index were assessed and reviewed.   During the course of the visit the patient was educated and counseled about appropriate screening and preventive services including : fall prevention , diabetes screening, nutrition counseling, colorectal cancer screening, and recommended immunizations.    Patient also reports that she was recently seen by her neurologist for followup of neuropathy. She was started on amitriptyline and has noticed some improvement in both pain and mobility in her lower legs with this medication.  Outpatient Encounter Prescriptions as of 11/22/2011  Medication Sig Dispense Refill  . alum & mag hydroxide-simeth (MAALOX/MYLANTA) 200-200-20 MG/5ML suspension Take by mouth every 6 (six) hours as needed.      Marland Kitchen amitriptyline (ELAVIL) 25 MG tablet Take 1 tablet (25 mg total) by mouth at bedtime.  30 tablet  6  . Calcium Carb-Cholecalciferol (CALCIUM 1000 + D PO) Take by mouth daily.        . Multiple Vitamin (MULTIVITAMIN) tablet Take 1 tablet by mouth daily.        . pantoprazole (PROTONIX) 40 MG tablet Take 1 tablet (40 mg total) by mouth daily.  30 tablet  3  . rizatriptan (MAXALT) 10 MG tablet Take 10 mg by mouth as needed. May repeat in 2 hours if needed       . topiramate (TOPAMAX) 25 MG tablet Take 1-2 tablets (25-50 mg total) by mouth daily.  60 tablet  6  . traMADol (ULTRAM) 50 MG tablet Take 1 tablet (50 mg total) by mouth every 6 (  six) hours as needed.  30 tablet  6  . zolpidem (AMBIEN) 5 MG tablet Take 0.5-1 tablets (2.5-5 mg total) by mouth at bedtime as needed.  30 tablet  3    BP 120/80  Pulse 64  Temp 98.2 F (36.8 C) (Oral)  Ht 5' 5.75" (1.67 m)  Wt 140 lb 4 oz (63.617 kg)  BMI 22.81 kg/m2  SpO2 97%  Review of Systems  Constitutional: Negative for fever, chills, appetite change, fatigue and unexpected weight change.  HENT: Negative for neck pain.   Eyes: Negative for visual  disturbance.  Respiratory: Negative for cough, shortness of breath, wheezing and stridor.   Cardiovascular: Negative for chest pain, palpitations and leg swelling.  Gastrointestinal: Negative for nausea, vomiting, abdominal pain, diarrhea and anal bleeding.  Genitourinary: Negative for dysuria and flank pain.  Musculoskeletal: Positive for myalgias. Negative for arthralgias and gait problem.  Skin: Negative for color change and rash.  Neurological: Negative for dizziness and headaches.  Hematological: Negative for adenopathy. Does not bruise/bleed easily.  Psychiatric/Behavioral: Negative for suicidal ideas, disturbed wake/sleep cycle and dysphoric mood. The patient is not nervous/anxious.        Objective:   Physical Exam  Constitutional: She is oriented to person, place, and time. She appears well-developed and well-nourished. No distress.  HENT:  Head: Normocephalic and atraumatic.  Right Ear: External ear normal.  Left Ear: External ear normal.  Nose: Nose normal.  Mouth/Throat: Oropharynx is clear and moist. No oropharyngeal exudate.  Eyes: Conjunctivae are normal. Pupils are equal, round, and reactive to light. Right eye exhibits no discharge. Left eye exhibits no discharge. No scleral icterus.  Neck: Normal range of motion. Neck supple. No tracheal deviation present. No thyromegaly present.  Cardiovascular: Normal rate, regular rhythm, normal heart sounds and intact distal pulses.  Exam reveals no gallop and no friction rub.   No murmur heard. Pulmonary/Chest: Effort normal and breath sounds normal. No accessory muscle usage. Not tachypneic. No respiratory distress. She has no decreased breath sounds. She has no wheezes. She has no rales. She exhibits no tenderness. Right breast exhibits no inverted nipple, no mass, no nipple discharge, no skin change and no tenderness. Left breast exhibits no inverted nipple, no mass, no nipple discharge, no skin change and no tenderness. Breasts  are symmetrical.  Abdominal: Soft. Bowel sounds are normal. She exhibits no distension and no mass. There is no tenderness. There is no rebound and no guarding.  Musculoskeletal: Normal range of motion. She exhibits no edema and no tenderness.  Lymphadenopathy:    She has no cervical adenopathy.  Neurological: She is alert and oriented to person, place, and time. No cranial nerve deficit. She exhibits normal muscle tone. Coordination normal.  Skin: Skin is warm and dry. No rash noted. She is not diaphoretic. No erythema. No pallor.  Psychiatric: She has a normal mood and affect. Her behavior is normal. Judgment and thought content normal.          Assessment & Plan:

## 2011-11-22 NOTE — Assessment & Plan Note (Signed)
Symptoms improved with amitriptyline. Will continue.

## 2011-11-22 NOTE — Assessment & Plan Note (Signed)
Patient has occasional cough secondary to allergies which is improved with use of Tussionex at night. Will refill this medicine today.

## 2011-11-22 NOTE — Assessment & Plan Note (Addendum)
General exam including breast exam is normal today. Pap is deferred as this is up-to-date. Vaccinations are up-to-date. Will obtain records on exact dates from previous physician. Will check CBC, CMP, and lipid profile today.

## 2012-01-23 NOTE — Telephone Encounter (Signed)
I called and spoke with Amanda Robbins again on this patient. She states that sometimes the insurance company will fax the information to the old office BMP, she will call [employer] as soon as they open at 9 this morning.

## 2012-01-24 ENCOUNTER — Telehealth: Payer: Self-pay | Admitting: *Deleted

## 2012-01-24 MED ORDER — AMITRIPTYLINE HCL 25 MG PO TABS: ORAL_TABLET | ORAL | Status: DC

## 2012-01-24 NOTE — Telephone Encounter (Signed)
Patient called stating that she would like for the directions on the Rx for Amitriptyline 25mg  to be changed.  She takes one and one half to two tablets by mouth daily depending on if she will be on her feet a lot.  She stated that with the way the directions read now she can't get a refill because the pharmacist says it is so soon to get the Rx refilled.  Please advise.

## 2012-01-24 NOTE — Telephone Encounter (Signed)
That is fine 

## 2012-01-30 ENCOUNTER — Ambulatory Visit (INDEPENDENT_AMBULATORY_CARE_PROVIDER_SITE_OTHER): Payer: Medicare Other | Admitting: *Deleted

## 2012-01-30 DIAGNOSIS — M810 Age-related osteoporosis without current pathological fracture: Secondary | ICD-10-CM

## 2012-01-30 MED ORDER — DENOSUMAB 60 MG/ML ~~LOC~~ SOLN
60.0000 mg | Freq: Once | SUBCUTANEOUS | Status: AC
  Administered 2012-01-30: 60 mg via SUBCUTANEOUS

## 2012-04-05 LAB — HM COLONOSCOPY: HM Colonoscopy: NORMAL

## 2012-05-23 ENCOUNTER — Ambulatory Visit (INDEPENDENT_AMBULATORY_CARE_PROVIDER_SITE_OTHER): Payer: Medicare Other | Admitting: Internal Medicine

## 2012-05-23 ENCOUNTER — Encounter: Payer: Self-pay | Admitting: Internal Medicine

## 2012-05-23 VITALS — BP 140/90 | HR 86 | Temp 98.2°F | Resp 17 | Wt 140.2 lb

## 2012-05-23 DIAGNOSIS — R42 Dizziness and giddiness: Secondary | ICD-10-CM

## 2012-05-23 DIAGNOSIS — E039 Hypothyroidism, unspecified: Secondary | ICD-10-CM

## 2012-05-23 DIAGNOSIS — G43909 Migraine, unspecified, not intractable, without status migrainosus: Secondary | ICD-10-CM

## 2012-05-23 DIAGNOSIS — D649 Anemia, unspecified: Secondary | ICD-10-CM

## 2012-05-23 DIAGNOSIS — Z1331 Encounter for screening for depression: Secondary | ICD-10-CM

## 2012-05-23 DIAGNOSIS — E785 Hyperlipidemia, unspecified: Secondary | ICD-10-CM

## 2012-05-23 LAB — LIPID PANEL
LDL Cholesterol: 84 mg/dL (ref 0–99)
Total CHOL/HDL Ratio: 2
Triglycerides: 69 mg/dL (ref 0.0–149.0)

## 2012-05-23 LAB — CBC WITH DIFFERENTIAL/PLATELET
Basophils Relative: 0.7 % (ref 0.0–3.0)
Eosinophils Absolute: 0.2 10*3/uL (ref 0.0–0.7)
Eosinophils Relative: 3.8 % (ref 0.0–5.0)
HCT: 43.1 % (ref 36.0–46.0)
Lymphs Abs: 1.5 10*3/uL (ref 0.7–4.0)
MCHC: 33.9 g/dL (ref 30.0–36.0)
MCV: 92.9 fl (ref 78.0–100.0)
Monocytes Absolute: 0.3 10*3/uL (ref 0.1–1.0)
Neutrophils Relative %: 55.2 % (ref 43.0–77.0)
RBC: 4.64 Mil/uL (ref 3.87–5.11)
WBC: 4.5 10*3/uL (ref 4.5–10.5)

## 2012-05-23 LAB — COMPREHENSIVE METABOLIC PANEL
ALT: 18 U/L (ref 0–35)
AST: 29 U/L (ref 0–37)
CO2: 28 mEq/L (ref 19–32)
Calcium: 10.4 mg/dL (ref 8.4–10.5)
Chloride: 108 mEq/L (ref 96–112)
GFR: 56.33 mL/min — ABNORMAL LOW (ref >60.00)
Sodium: 141 mEq/L (ref 135–145)
Total Bilirubin: 1.1 mg/dL (ref 0.3–1.2)
Total Protein: 7.4 g/dL (ref 6.0–8.3)

## 2012-05-23 NOTE — Assessment & Plan Note (Signed)
Episodes of lightheadedness seem most consistent with either orthostatic hypotension or possibly cardiac arrhythmia given concurrent palpitations. EKG per cardiology was normal. Will check labs including CBC, CMP, TSH. Will check carotid dopplers. Will request records from cardiologist. If labs and dopplers are normal and symptoms are persisting, would favor setting up patient with Holter monitor and repeat cardiac ECHO given h/o MVP.

## 2012-05-23 NOTE — Assessment & Plan Note (Signed)
Symptoms of migraine have slightly worsened since stopping amitriptyline. Will try increasing dose of Topamax to 50 mg daily. Followup one month.

## 2012-05-23 NOTE — Progress Notes (Signed)
Subjective:    Patient ID: Amanda Robbins, female    DOB: 1941-06-12, 71 y.o.   MRN: [id number]  HPI 71 year old female with history of migraine headaches, neuropathy, and irritable bowel syndrome presents for acute visit complaining of several week history of intermittent episodes of lightheadedness. Episodes occur generally when standing. Described as a sense of lightheadedness and impending syncope. No syncopal episodes noted. She denies any focal neurologic changes such as numbness, slurred speech. She denies chest pain. She does occasionally have palpitations. She was seen by her cardiologist yesterday who performed EKG which was normal. She has a history of mitral valve prolapse and last echo was stable in 2012. She reports that her cardiologist has scheduled a stress test for next month. She denies any recent illnesses, change in bowel habits, change in appetite, weight loss. Aside from intermittent episodes of lightheadedness she reports she is feeling well.  In regards to history of migraine headaches, patient reports she recently tapered herself off of amitriptyline. She is currently taking Topamax 25 mg daily. She is not currently having headache but is concerned about recurrent symptoms. She questions whether she may increase her dose of Topamax.  Outpatient Encounter Prescriptions as of 05/23/2012  Medication Sig Dispense Refill  . alum & mag hydroxide-simeth (MAALOX/MYLANTA) 200-200-20 MG/5ML suspension Take by mouth every 6 (six) hours as needed.      . Calcium Carb-Cholecalciferol (CALCIUM 1000 + D PO) Take by mouth daily.        . chlorpheniramine-HYDROcodone (TUSSIONEX PENNKINETIC ER) 10-8 MG/5ML LQCR Take 5 mLs by mouth at bedtime as needed.  140 mL  1  . Multiple Vitamin (MULTIVITAMIN) tablet Take 1 tablet by mouth daily.        . pantoprazole (PROTONIX) 40 MG tablet Take 1 tablet (40 mg total) by mouth daily.  30 tablet  3  . rizatriptan (MAXALT) 10 MG tablet Take 10 mg by mouth as  needed. May repeat in 2 hours if needed       . topiramate (TOPAMAX) 25 MG tablet Take 1-2 tablets (25-50 mg total) by mouth daily.  60 tablet  6  . traMADol (ULTRAM) 50 MG tablet Take 1 tablet (50 mg total) by mouth every 6 (six) hours as needed.  30 tablet  6  . zolpidem (AMBIEN) 5 MG tablet Take 0.5-1 tablets (2.5-5 mg total) by mouth at bedtime as needed.  30 tablet  3  . [DISCONTINUED] amitriptyline (ELAVIL) 25 MG tablet Take one and one half to two tablets by mouth daily  60 tablet  2    Review of Systems  Constitutional: Negative for fever, chills, appetite change, fatigue and unexpected weight change.  HENT: Negative for ear pain, congestion, sore throat, trouble swallowing, neck pain, voice change and sinus pressure.   Eyes: Negative for visual disturbance.  Respiratory: Negative for cough, shortness of breath, wheezing and stridor.   Cardiovascular: Positive for palpitations. Negative for chest pain and leg swelling.  Gastrointestinal: Negative for nausea, vomiting, abdominal pain, diarrhea, constipation, blood in stool, abdominal distention and anal bleeding.  Genitourinary: Negative for dysuria and flank pain.  Musculoskeletal: Negative for myalgias, arthralgias and gait problem.  Skin: Negative for color change and rash.  Neurological: Positive for weakness, light-headedness and numbness (chronic in foot). Negative for dizziness, tremors, seizures, syncope, speech difficulty and headaches.  Hematological: Negative for adenopathy. Does not bruise/bleed easily.  Psychiatric/Behavioral: Negative for suicidal ideas, sleep disturbance and dysphoric mood. The patient is not nervous/anxious.  Objective:   Physical Exam  Constitutional: She is oriented to person, place, and time. She appears well-developed and well-nourished. No distress.  HENT:  Head: Normocephalic and atraumatic.  Right Ear: External ear normal.  Left Ear: External ear normal.  Nose: Nose normal.   Mouth/Throat: Oropharynx is clear and moist. No oropharyngeal exudate.  Eyes: Conjunctivae normal are normal. Pupils are equal, round, and reactive to light. Right eye exhibits no discharge. Left eye exhibits no discharge. No scleral icterus.  Neck: Normal range of motion. Neck supple. Carotid bruit is not present. No tracheal deviation present. No thyromegaly present.  Cardiovascular: Normal rate, regular rhythm and intact distal pulses.  Exam reveals no gallop and no friction rub.   Murmur heard. Pulmonary/Chest: Effort normal and breath sounds normal. No respiratory distress. She has no wheezes. She has no rales. She exhibits no tenderness.  Musculoskeletal: Normal range of motion. She exhibits no edema and no tenderness.  Lymphadenopathy:    She has no cervical adenopathy.  Neurological: She is alert and oriented to person, place, and time. No cranial nerve deficit. She exhibits normal muscle tone. Coordination normal.  Skin: Skin is warm and dry. No rash noted. She is not diaphoretic. No erythema. No pallor.  Psychiatric: She has a normal mood and affect. Her behavior is normal. Judgment and thought content normal.          Assessment & Plan:

## 2012-05-24 ENCOUNTER — Telehealth: Payer: Self-pay | Admitting: Internal Medicine

## 2012-05-24 NOTE — Telephone Encounter (Signed)
Carotid dopplers normal

## 2012-05-27 ENCOUNTER — Telehealth: Payer: Self-pay | Admitting: General Practice

## 2012-05-27 NOTE — Telephone Encounter (Signed)
Pt.notified

## 2012-05-27 NOTE — Telephone Encounter (Signed)
Pt called wanting to know if we had gotten any results from her Vascular appt or the blood that was drawn last week. Please advise.

## 2012-05-27 NOTE — Telephone Encounter (Signed)
Yes, I had sent her all of her labs by email, which were normal. Carotid dopplers were also normal. If symptoms were persistent, I wanted to follow up with cardiologist for stress test and possible Holter monitor.

## 2012-06-07 ENCOUNTER — Encounter: Payer: Self-pay | Admitting: Internal Medicine

## 2012-06-27 ENCOUNTER — Encounter: Payer: Self-pay | Admitting: Internal Medicine

## 2012-06-27 ENCOUNTER — Ambulatory Visit (INDEPENDENT_AMBULATORY_CARE_PROVIDER_SITE_OTHER): Payer: Medicare Other | Admitting: Internal Medicine

## 2012-06-27 VITALS — BP 130/64 | HR 61 | Temp 98.0°F | Ht 65.75 in | Wt 136.5 lb

## 2012-06-27 DIAGNOSIS — G4700 Insomnia, unspecified: Secondary | ICD-10-CM

## 2012-06-27 DIAGNOSIS — R42 Dizziness and giddiness: Secondary | ICD-10-CM

## 2012-06-27 DIAGNOSIS — J069 Acute upper respiratory infection, unspecified: Secondary | ICD-10-CM

## 2012-06-27 MED ORDER — ZOLPIDEM TARTRATE 5 MG PO TABS: 2.5000 mg | ORAL_TABLET | Freq: Every evening | ORAL | Status: DC | PRN

## 2012-06-27 NOTE — Progress Notes (Signed)
Subjective:    Patient ID: Amanda Robbins, female    DOB: 07/07/1940, 72 y.o.   MRN: [id number]  HPI 72YO female presents for followup. At her last visit, she was concerned about episodes of lightheadedness with palpitations described as rapid heartbeat. Lab evaluation including electrolytes, thyroid function, blood counts were normal. Carotid Dopplers were normal. She underwent evaluation with her cardiologist including treadmill stress test and Holter monitor and results are pending. She is also concerned today about one week history of general malaise,nausea, no vomiting. Notes that grandchildren in town with strep and viral URI.Tues,  Developed sore throat. Wed fever 100.59F. Non-productive cough. Minimal nasal drainage.  Headache. Some improvement with Tylenol. No chest pain or dyspnea.   Outpatient Encounter Prescriptions as of 06/27/2012  Medication Sig Dispense Refill  . alum & mag hydroxide-simeth (MAALOX/MYLANTA) 200-200-20 MG/5ML suspension Take by mouth every 6 (six) hours as needed.      . Calcium Carb-Cholecalciferol (CALCIUM 1000 + D PO) Take by mouth daily.        . chlorpheniramine-HYDROcodone (TUSSIONEX PENNKINETIC ER) 10-8 MG/5ML LQCR Take 5 mLs by mouth at bedtime as needed.  140 mL  1  . Multiple Vitamin (MULTIVITAMIN) tablet Take 1 tablet by mouth daily.        . pantoprazole (PROTONIX) 40 MG tablet Take 1 tablet (40 mg total) by mouth daily.  30 tablet  3  . rizatriptan (MAXALT) 10 MG tablet Take 10 mg by mouth as needed. May repeat in 2 hours if needed       . topiramate (TOPAMAX) 25 MG tablet Take 1-2 tablets (25-50 mg total) by mouth daily.  60 tablet  6  . traMADol (ULTRAM) 50 MG tablet Take 1 tablet (50 mg total) by mouth every 6 (six) hours as needed.  30 tablet  6  . zolpidem (AMBIEN) 5 MG tablet Take 0.5-1 tablets (2.5-5 mg total) by mouth at bedtime as needed.  30 tablet  4  . [DISCONTINUED] zolpidem (AMBIEN) 5 MG tablet Take 0.5-1 tablets (2.5-5 mg total) by mouth at  bedtime as needed.  30 tablet  3   BP 130/64  Pulse 61  Temp 98 F (36.7 C) (Oral)  Ht 5' 5.75" (1.67 m)  Wt 136 lb 8 oz (61.916 kg)  BMI 22.20 kg/m2  SpO2 98%  Review of Systems  Constitutional: Positive for fever, chills and fatigue. Negative for unexpected weight change.  HENT: Positive for congestion, sore throat and rhinorrhea. Negative for hearing loss, ear pain, nosebleeds, facial swelling, sneezing, mouth sores, trouble swallowing, neck pain, neck stiffness, voice change, postnasal drip, sinus pressure, tinnitus and ear discharge.   Eyes: Negative for pain, discharge, redness and visual disturbance.  Respiratory: Positive for cough. Negative for chest tightness, shortness of breath, wheezing and stridor.   Cardiovascular: Negative for chest pain, palpitations and leg swelling.  Gastrointestinal: Positive for nausea.  Musculoskeletal: Negative for myalgias and arthralgias.  Skin: Negative for color change and rash.  Neurological: Positive for light-headedness. Negative for dizziness, weakness and headaches.  Hematological: Negative for adenopathy.       Objective:   Physical Exam  Constitutional: She is oriented to person, place, and time. She appears well-developed and well-nourished. No distress.  HENT:  Head: Normocephalic and atraumatic.  Right Ear: Tympanic membrane and external ear normal.  Left Ear: Tympanic membrane and external ear normal.  Nose: Nose normal.  Mouth/Throat: Posterior oropharyngeal erythema present. No oropharyngeal exudate.  Eyes: Conjunctivae normal are normal. Pupils are equal,  round, and reactive to light. Right eye exhibits no discharge. Left eye exhibits no discharge. No scleral icterus.  Neck: Normal range of motion. Neck supple. No tracheal deviation present. No thyromegaly present.  Cardiovascular: Normal rate, regular rhythm, normal heart sounds and intact distal pulses.  Exam reveals no gallop and no friction rub.   No murmur  heard. Pulmonary/Chest: Effort normal and breath sounds normal. No accessory muscle usage. Not tachypneic. No respiratory distress. She has no decreased breath sounds. She has no wheezes. She has no rhonchi. She has no rales. She exhibits no tenderness.  Musculoskeletal: Normal range of motion. She exhibits no edema and no tenderness.  Lymphadenopathy:    She has no cervical adenopathy.  Neurological: She is alert and oriented to person, place, and time. No cranial nerve deficit. She exhibits normal muscle tone. Coordination normal.  Skin: Skin is warm and dry. No rash noted. She is not diaphoretic. No erythema. No pallor.  Psychiatric: She has a normal mood and affect. Her behavior is normal. Judgment and thought content normal.          Assessment & Plan:

## 2012-06-27 NOTE — Assessment & Plan Note (Signed)
Symptoms and exam are most consistent with viral upper respiratory infection. Flu test is negative today. Encouraged patient to continue supportive care with increased fluid intake and Tylenol or ibuprofen as needed for pain and fever. She will use Claritin or Benadryl as needed for congestion. She will call or return to clinic if symptoms are persistent or she has worsening cough, shortness of breath, or fever

## 2012-06-27 NOTE — Assessment & Plan Note (Signed)
Episodes of lightheadedness with palpitations concerning for cardiac arrhythmia. Lab evaluation was normal. Carotid Dopplers were normal. Patient completed treadmill stress test and Holter monitor but is unsure of results. Has followup with cardiology next week. Will request records.

## 2012-07-01 ENCOUNTER — Telehealth: Payer: Self-pay | Admitting: Internal Medicine

## 2012-07-01 ENCOUNTER — Ambulatory Visit (INDEPENDENT_AMBULATORY_CARE_PROVIDER_SITE_OTHER): Payer: Medicare Other | Admitting: Internal Medicine

## 2012-07-01 ENCOUNTER — Ambulatory Visit: Payer: Medicare Other | Admitting: Internal Medicine

## 2012-07-01 ENCOUNTER — Encounter: Payer: Self-pay | Admitting: Internal Medicine

## 2012-07-01 VITALS — BP 120/78 | HR 92 | Temp 98.6°F | Ht 65.75 in | Wt 136.8 lb

## 2012-07-01 DIAGNOSIS — J0100 Acute maxillary sinusitis, unspecified: Secondary | ICD-10-CM | POA: Insufficient documentation

## 2012-07-01 MED ORDER — AMOXICILLIN-POT CLAVULANATE 875-125 MG PO TABS: 1.0000 | ORAL_TABLET | Freq: Two times a day (BID) | ORAL | Status: DC

## 2012-07-01 NOTE — Assessment & Plan Note (Signed)
Symptoms and exam most consistent with acute maxillary sinusitis. Will treat with Augmentin, prn antihistamines. Increased fluid intake, rest. Pt will call if symptoms not improving over next 48-72hr.

## 2012-07-01 NOTE — Telephone Encounter (Signed)
Patient Information:  Caller Name: Shanaia  Phone: [phone]  Patient: Amanda Robbins  Gender: Female  DOB: 1940/09/26  Age: 72 Years  PCP: Ronna Polio (Adults only)  Office Follow Up:  Does the office need to follow up with this patient?: No  Instructions For The Office: N/A  RN Note:  Started last Wed with respiratory sxs  she said 1-18 she began with low grade fevers 100 and now is blowing out a lot of green blood tinged nasal drainage and also coughing up green mucous  she also has a lot of sinus pain especially if she bends over  Symptoms  Reason For Call & Symptoms: green and blood nasal drainage, coughing green mucous  Reviewed Health History In EMR: Yes  Reviewed Medications In EMR: Yes  Reviewed Allergies In EMR: Yes  Reviewed Surgeries / Procedures: Yes  Date of Onset of Symptoms: 06/26/2012  Any Fever: Yes  Fever Taken: Oral  Fever Time Of Reading: 06:30:00  Fever Last Reading: 100  Guideline(s) Used:  Colds  Disposition Per Guideline:   See Today in Office  Reason For Disposition Reached:   Sinus pain (not just congestion) and fever  Advice Given:  Call Back If:  You become worse  Appointment Scheduled:  07/01/2012 16:15:00 Appointment Scheduled Provider:  Duncan Dull (Adults only)

## 2012-07-01 NOTE — Progress Notes (Signed)
Subjective:    Patient ID: Amanda Robbins, female    DOB: [birth year], 72 y.o.   MRN: [id number]  HPI 72 year old female presents for acute visit complaining of nearly 2 week history of nasal congestion, sinus pressure, fever, chills, cough productive of purulent sputum. Symptoms first began about 2 weeks ago. Associated with general malaise, fever, chills. She developed progressively worsening cough, initially with clear sputum and then developed greenish sputum. She denies chest pain or shortness of breath. Symptoms of sinus pressure gradually worsened. She complains of pain in her left ear. No improvement with use of over-the-counter medications including antihistamines, Advil, and Tylenol. Using Tussionex cough syrup with some improvement in cough.  Outpatient Encounter Prescriptions as of 07/01/2012  Medication Sig Dispense Refill  . alum & mag hydroxide-simeth (MAALOX/MYLANTA) 200-200-20 MG/5ML suspension Take by mouth every 6 (six) hours as needed.      . Calcium Carb-Cholecalciferol (CALCIUM 1000 + D PO) Take by mouth daily.        . chlorpheniramine-HYDROcodone (TUSSIONEX PENNKINETIC ER) 10-8 MG/5ML LQCR Take 5 mLs by mouth at bedtime as needed.  140 mL  1  . Multiple Vitamin (MULTIVITAMIN) tablet Take 1 tablet by mouth daily.        . pantoprazole (PROTONIX) 40 MG tablet Take 1 tablet (40 mg total) by mouth daily.  30 tablet  3  . rizatriptan (MAXALT) 10 MG tablet Take 10 mg by mouth as needed. May repeat in 2 hours if needed       . topiramate (TOPAMAX) 25 MG tablet Take 1-2 tablets (25-50 mg total) by mouth daily.  60 tablet  6  . traMADol (ULTRAM) 50 MG tablet Take 1 tablet (50 mg total) by mouth every 6 (six) hours as needed.  30 tablet  6  . zolpidem (AMBIEN) 5 MG tablet Take 0.5-1 tablets (2.5-5 mg total) by mouth at bedtime as needed.  30 tablet  4  . amoxicillin-clavulanate (AUGMENTIN) 875-125 MG per tablet Take 1 tablet by mouth 2 (two) times daily.  20 tablet  0   BP 120/78  Pulse  92  Temp 98.6 F (37 C) (Oral)  Ht 5' 5.75" (1.67 m)  Wt 136 lb 12 oz (62.029 kg)  BMI 22.24 kg/m2  SpO2 94%  Review of Systems  Constitutional: Positive for fever, chills and fatigue. Negative for appetite change and unexpected weight change.  HENT: Positive for ear pain, congestion, rhinorrhea, postnasal drip and sinus pressure. Negative for sore throat, trouble swallowing, neck pain and voice change.   Eyes: Negative for visual disturbance.  Respiratory: Positive for cough. Negative for shortness of breath, wheezing and stridor.   Cardiovascular: Negative for chest pain, palpitations and leg swelling.  Gastrointestinal: Negative for nausea, vomiting, abdominal pain, diarrhea, constipation, blood in stool, abdominal distention and anal bleeding.  Genitourinary: Negative for dysuria and flank pain.  Musculoskeletal: Negative for myalgias, arthralgias and gait problem.  Skin: Negative for color change and rash.  Neurological: Negative for dizziness and headaches.  Hematological: Negative for adenopathy. Does not bruise/bleed easily.  Psychiatric/Behavioral: Negative for suicidal ideas, sleep disturbance and dysphoric mood. The patient is not nervous/anxious.        Objective:   Physical Exam  Constitutional: She is oriented to person, place, and time. She appears well-developed and well-nourished. No distress.  HENT:  Head: Normocephalic and atraumatic.  Right Ear: Tympanic membrane and external ear normal.  Left Ear: Tympanic membrane and external ear normal.  Nose: Mucosal edema present. Right sinus exhibits  maxillary sinus tenderness. Left sinus exhibits maxillary sinus tenderness.  Mouth/Throat: Oropharynx is clear and moist. No oropharyngeal exudate.  Eyes: Conjunctivae normal are normal. Pupils are equal, round, and reactive to light. Right eye exhibits no discharge. Left eye exhibits no discharge. No scleral icterus.  Neck: Normal range of motion. Neck supple. No tracheal  deviation present. No thyromegaly present.  Cardiovascular: Normal rate, regular rhythm, normal heart sounds and intact distal pulses.  Exam reveals no gallop and no friction rub.   No murmur heard. Pulmonary/Chest: Effort normal and breath sounds normal. No accessory muscle usage. Not tachypneic. No respiratory distress. She has no decreased breath sounds. She has no wheezes. She has no rhonchi. She has no rales. She exhibits no tenderness.  Musculoskeletal: Normal range of motion. She exhibits no edema and no tenderness.  Lymphadenopathy:    She has no cervical adenopathy.  Neurological: She is alert and oriented to person, place, and time. No cranial nerve deficit. She exhibits normal muscle tone. Coordination normal.  Skin: Skin is warm and dry. No rash noted. She is not diaphoretic. No erythema. No pallor.  Psychiatric: She has a normal mood and affect. Her behavior is normal. Judgment and thought content normal.          Assessment & Plan:

## 2012-07-10 ENCOUNTER — Ambulatory Visit: Payer: Medicare Other | Admitting: Internal Medicine

## 2012-07-22 ENCOUNTER — Encounter: Payer: Self-pay | Admitting: Internal Medicine

## 2012-07-30 ENCOUNTER — Ambulatory Visit: Payer: Medicare Other | Admitting: Internal Medicine

## 2012-07-30 ENCOUNTER — Telehealth: Payer: Self-pay | Admitting: Internal Medicine

## 2012-07-30 ENCOUNTER — Ambulatory Visit (INDEPENDENT_AMBULATORY_CARE_PROVIDER_SITE_OTHER): Payer: Medicare Other | Admitting: *Deleted

## 2012-07-30 DIAGNOSIS — G43909 Migraine, unspecified, not intractable, without status migrainosus: Secondary | ICD-10-CM

## 2012-07-30 DIAGNOSIS — M810 Age-related osteoporosis without current pathological fracture: Secondary | ICD-10-CM

## 2012-07-30 MED ORDER — TOPIRAMATE 25 MG PO TABS: 25.0000 mg | ORAL_TABLET | Freq: Every day | ORAL | Status: DC

## 2012-07-30 MED ORDER — DENOSUMAB 60 MG/ML ~~LOC~~ SOLN
60.0000 mg | Freq: Once | SUBCUTANEOUS | Status: AC
  Administered 2012-07-30: 60 mg via SUBCUTANEOUS

## 2012-07-30 NOTE — Telephone Encounter (Signed)
Refill on Topiramate 25 mg tablet's she uses [address] on [address]

## 2012-07-30 NOTE — Telephone Encounter (Signed)
Rx sent to pharmacy on file.

## 2012-08-12 ENCOUNTER — Ambulatory Visit: Payer: Self-pay | Admitting: Unknown Physician Specialty

## 2012-08-13 LAB — PATHOLOGY REPORT

## 2012-09-09 ENCOUNTER — Encounter: Payer: Self-pay | Admitting: Internal Medicine

## 2012-09-12 ENCOUNTER — Telehealth: Payer: Self-pay | Admitting: *Deleted

## 2012-09-12 NOTE — Telephone Encounter (Signed)
Please confirm that she is taking the Gabapentin. If she is not taking it now, we will need to see her to restart medication and titrate up dose.

## 2012-09-12 NOTE — Telephone Encounter (Signed)
Ok to give her some refills on the Gabapentin, has not had this refilled in a while.

## 2012-09-12 NOTE — Telephone Encounter (Signed)
Patient is requesting a refill on gabapentin but it is currently not her MAR.

## 2012-09-12 NOTE — Telephone Encounter (Signed)
OK. We can restart Gabapentin 100mg  po daily. #30 with 1 refill. Needs a follow up visit to assess medication use after starting.

## 2012-09-12 NOTE — Telephone Encounter (Signed)
Gabapentin was taking it for awhile and went off of it. Neuropathy is getting worse and it was making her dizzy when she was taking it in the past but she has tried it again and it doesn't make her feel bad anymore. She would like to go back on it Gabapentin 100 mg once day to [facility] on [address] in [place].

## 2012-09-13 MED ORDER — GABAPENTIN 100 MG PO CAPS: 100.0000 mg | ORAL_CAPSULE | Freq: Every day | ORAL | Status: DC

## 2012-09-13 NOTE — Telephone Encounter (Signed)
Prescription sent to pharmacy and patient aware. She already has an appointment in June

## 2012-09-24 ENCOUNTER — Other Ambulatory Visit: Payer: Self-pay | Admitting: Internal Medicine

## 2012-10-14 ENCOUNTER — Ambulatory Visit (INDEPENDENT_AMBULATORY_CARE_PROVIDER_SITE_OTHER): Payer: Medicare Other | Admitting: Internal Medicine

## 2012-10-14 ENCOUNTER — Encounter: Payer: Self-pay | Admitting: Internal Medicine

## 2012-10-14 ENCOUNTER — Ambulatory Visit: Payer: Self-pay | Admitting: Internal Medicine

## 2012-10-14 ENCOUNTER — Telehealth: Payer: Self-pay | Admitting: Internal Medicine

## 2012-10-14 VITALS — BP 106/70 | HR 67 | Temp 97.9°F | Wt 137.0 lb

## 2012-10-14 DIAGNOSIS — R109 Unspecified abdominal pain: Secondary | ICD-10-CM

## 2012-10-14 DIAGNOSIS — N390 Urinary tract infection, site not specified: Secondary | ICD-10-CM | POA: Insufficient documentation

## 2012-10-14 LAB — POCT URINALYSIS DIPSTICK
Glucose, UA: NEGATIVE
Leukocytes, UA: NEGATIVE
Nitrite, UA: NEGATIVE
Spec Grav, UA: 1.015
Urobilinogen, UA: 0.2

## 2012-10-14 MED ORDER — CIPROFLOXACIN HCL 500 MG PO TABS: 500.0000 mg | ORAL_TABLET | Freq: Two times a day (BID) | ORAL | Status: DC

## 2012-10-14 NOTE — Assessment & Plan Note (Signed)
Symptoms and hematuria concerning for nephrolithiasis. Will get CT abdomen for evaluation.

## 2012-10-14 NOTE — Telephone Encounter (Signed)
She will need to be seen at 4pm in an acute slot, or earlier at urgent care if necessary.

## 2012-10-14 NOTE — Telephone Encounter (Signed)
Patient Information:  Caller Name: Arlanda  Phone: [phone]  Patient: Amanda Robbins, Amanda Robbins  Gender: Female  DOB: 1941/05/23  Age: 72 Years  PCP: Ronna Polio (Adults only)  Office Follow Up:  Does the office need to follow up with this patient?: Yes  Instructions For The Office: Please contact paitent about possible work in appointment; next available with Dr. Dan Humphreys is 1600 and patient needs to be seen sooner per protocol.   Symptoms  Reason For Call & Symptoms: "Bladder infection"  Right flank pain rated at 5 of 10.  Reviewed Health History In EMR: Yes  Reviewed Medications In EMR: Yes  Reviewed Allergies In EMR: Yes  Reviewed Surgeries / Procedures: Yes  Date of Onset of Symptoms: 10/07/2012  Treatments Tried: Water, cranberry juice, baking soda and water  Treatments Tried Worked: No  Guideline(s) Used:  Urination Pain - Female  Disposition Per Guideline:   Go to Office Now  Reason For Disposition Reached:   Side (flank) or lower back pain present  Advice Given:  Fluids:   Drink extra fluids. Drink 8-10 glasses of liquids a day (Reason: to produce a dilute, non-irritating urine).  Cranberry Juice:   Caution: Do not drink more than 16 oz (480 ml). Here is the reason: too much cranberry juice can also be irritating to the bladder.  Warm Saline SITZ Baths to Reduce Pain:  Sit in a warm saline bath for 20 minutes to cleanse the area and to reduce pain. Add 2 oz. of table salt or baking soda to a tub of water.  Call Back If:  You become worse.  Patient Will Follow Care Advice:  YES

## 2012-10-14 NOTE — Telephone Encounter (Signed)
Patient confirmed appointment for today at 4:00

## 2012-10-14 NOTE — Progress Notes (Signed)
Subjective:    Patient ID: Amanda Robbins, female    DOB: Oct 09, 1940, 72 y.o.   MRN: [id number]  HPI 72 year old female presents for acute visit complaining of one to two-week history of dysuria, suprapubic abdominal pain, urinary frequency. She denies hematuria. She denies any symptoms of fever or chills. Over the last couple of days her symptoms have worsened and she has developed right-sided flank pain. She has taken over-the-counter AZO with no improvement in her symptoms.  Outpatient Encounter Prescriptions as of 10/14/2012  Medication Sig Dispense Refill  . acetaminophen (TYLENOL) 500 MG tablet Take 500 mg by mouth every 6 (six) hours as needed for pain.      . Calcium Carb-Cholecalciferol (CALCIUM 1000 + D PO) Take by mouth daily.        Marland Kitchen gabapentin (NEURONTIN) 100 MG capsule Take 1 capsule (100 mg total) by mouth daily.  30 capsule  1  . metoprolol succinate (TOPROL-XL) 25 MG 24 hr tablet       . Multiple Vitamin (MULTIVITAMIN) tablet Take 1 tablet by mouth daily.        Marland Kitchen topiramate (TOPAMAX) 25 MG tablet TAKE 1 TO 2 TABLETS (25-50MG ) BY MOUTH DAILY AS DIRECTED  60 tablet  0  . traMADol (ULTRAM) 50 MG tablet Take 1 tablet (50 mg total) by mouth every 6 (six) hours as needed.  30 tablet  6  . zolpidem (AMBIEN) 5 MG tablet Take 0.5-1 tablets (2.5-5 mg total) by mouth at bedtime as needed.  30 tablet  4  . alum & mag hydroxide-simeth (MAALOX/MYLANTA) 200-200-20 MG/5ML suspension Take by mouth every 6 (six) hours as needed.      . chlorpheniramine-HYDROcodone (TUSSIONEX PENNKINETIC ER) 10-8 MG/5ML LQCR Take 5 mLs by mouth at bedtime as needed.  140 mL  1  . ciprofloxacin (CIPRO) 500 MG tablet Take 1 tablet (500 mg total) by mouth 2 (two) times daily.  14 tablet  0  . rizatriptan (MAXALT) 10 MG tablet Take 10 mg by mouth as needed. May repeat in 2 hours if needed       . [DISCONTINUED] amoxicillin-clavulanate (AUGMENTIN) 875-125 MG per tablet Take 1 tablet by mouth 2 (two) times daily.  20  tablet  0   No facility-administered encounter medications on file as of 10/14/2012.   BP 106/70  Pulse 67  Temp(Src) 97.9 F (36.6 C) (Oral)  Wt 137 lb (62.143 kg)  BMI 22.28 kg/m2  SpO2 98%  Review of Systems  Constitutional: Negative for fever, chills and fatigue.  Gastrointestinal: Negative for nausea, vomiting, abdominal pain, diarrhea, constipation and rectal pain.  Genitourinary: Positive for dysuria, urgency, frequency and flank pain. Negative for hematuria, decreased urine volume, vaginal bleeding, vaginal discharge, difficulty urinating, vaginal pain and pelvic pain.       Objective:   Physical Exam  Constitutional: She is oriented to person, place, and time. She appears well-developed and well-nourished. No distress.  HENT:  Head: Normocephalic and atraumatic.  Right Ear: External ear normal.  Left Ear: External ear normal.  Nose: Nose normal.  Mouth/Throat: Oropharynx is clear and moist.  Eyes: Conjunctivae are normal. Pupils are equal, round, and reactive to light. Right eye exhibits no discharge. Left eye exhibits no discharge. No scleral icterus.  Neck: Normal range of motion. Neck supple. No tracheal deviation present. No thyromegaly present.  Cardiovascular: Normal rate, regular rhythm, normal heart sounds and intact distal pulses.  Exam reveals no gallop and no friction rub.   No murmur heard. Pulmonary/Chest:  Effort normal and breath sounds normal. No respiratory distress. She has no wheezes. She has no rales. She exhibits no tenderness.  Abdominal: Soft. Bowel sounds are normal. She exhibits no distension. There is tenderness (suprapubic, no CVA tenderness). There is no rebound and no guarding.  Musculoskeletal: Normal range of motion. She exhibits no edema and no tenderness.  Lymphadenopathy:    She has no cervical adenopathy.  Neurological: She is alert and oriented to person, place, and time. No cranial nerve deficit. She exhibits normal muscle tone.  Coordination normal.  Skin: Skin is warm and dry. No rash noted. She is not diaphoretic. No erythema. No pallor.  Psychiatric: She has a normal mood and affect. Her behavior is normal. Judgment and thought content normal.          Assessment & Plan:

## 2012-10-14 NOTE — Assessment & Plan Note (Signed)
Symptoms are most consistent with UTI, however given significant recent flank pain, will get CT abdomen for further evaluation nephrolithiasis. Will send urine for culture. Will treat with Cipro. Pt will call if symptoms are not improving or if any worsening symptoms.

## 2012-10-15 ENCOUNTER — Telehealth: Payer: Self-pay | Admitting: Internal Medicine

## 2012-10-15 NOTE — Telephone Encounter (Signed)
Patient informed and verbally agreed understanding.  

## 2012-10-15 NOTE — Addendum Note (Signed)
Addended by: Montine Circle D on: 10/15/2012 08:02 AM   Modules accepted: Orders

## 2012-10-15 NOTE — Telephone Encounter (Signed)
CT of the abdomen showed no kidney stone. Please ask pt to continue antibiotics and let us know if symptoms are not improving.

## 2012-10-17 ENCOUNTER — Other Ambulatory Visit (INDEPENDENT_AMBULATORY_CARE_PROVIDER_SITE_OTHER): Payer: Medicare Other

## 2012-10-17 ENCOUNTER — Encounter: Payer: Self-pay | Admitting: Internal Medicine

## 2012-10-17 DIAGNOSIS — N390 Urinary tract infection, site not specified: Secondary | ICD-10-CM

## 2012-10-17 LAB — URINE CULTURE
Colony Count: NO GROWTH
Organism ID, Bacteria: NO GROWTH

## 2012-10-17 LAB — POCT URINALYSIS DIPSTICK
Blood, UA: NEGATIVE
Protein, UA: NEGATIVE
Spec Grav, UA: 1.015
Urobilinogen, UA: 0.2
pH, UA: 7

## 2012-11-06 ENCOUNTER — Encounter: Payer: Self-pay | Admitting: Internal Medicine

## 2012-11-07 ENCOUNTER — Other Ambulatory Visit: Payer: Self-pay | Admitting: Internal Medicine

## 2012-11-07 NOTE — Telephone Encounter (Signed)
Eprescribed.

## 2012-11-11 ENCOUNTER — Other Ambulatory Visit: Payer: Self-pay | Admitting: Internal Medicine

## 2012-11-29 ENCOUNTER — Encounter: Payer: Self-pay | Admitting: Internal Medicine

## 2012-11-29 ENCOUNTER — Ambulatory Visit (INDEPENDENT_AMBULATORY_CARE_PROVIDER_SITE_OTHER): Payer: Medicare Other | Admitting: Internal Medicine

## 2012-11-29 VITALS — BP 130/84 | HR 67 | Temp 98.0°F | Ht 66.0 in | Wt 136.0 lb

## 2012-11-29 DIAGNOSIS — G629 Polyneuropathy, unspecified: Secondary | ICD-10-CM

## 2012-11-29 DIAGNOSIS — Z0000 Encounter for general adult medical examination without abnormal findings: Secondary | ICD-10-CM

## 2012-11-29 DIAGNOSIS — G4700 Insomnia, unspecified: Secondary | ICD-10-CM

## 2012-11-29 DIAGNOSIS — G589 Mononeuropathy, unspecified: Secondary | ICD-10-CM

## 2012-11-29 LAB — CBC WITH DIFFERENTIAL/PLATELET
Basophils Relative: 0.8 % (ref 0.0–3.0)
Eosinophils Absolute: 0.2 10*3/uL (ref 0.0–0.7)
Eosinophils Relative: 4.3 % (ref 0.0–5.0)
Lymphocytes Relative: 37.9 % (ref 12.0–46.0)
MCHC: 33.9 g/dL (ref 30.0–36.0)
Neutrophils Relative %: 50.6 % (ref 43.0–77.0)
Platelets: 118 10*3/uL — ABNORMAL LOW (ref 150.0–400.0)
RBC: 4.36 Mil/uL (ref 3.87–5.11)
WBC: 4.2 10*3/uL — ABNORMAL LOW (ref 4.5–10.5)

## 2012-11-29 LAB — COMPREHENSIVE METABOLIC PANEL
Albumin: 4 g/dL (ref 3.5–5.2)
Alkaline Phosphatase: 43 U/L (ref 39–117)
BUN: 15 mg/dL (ref 6–23)
Creatinine, Ser: 0.9 mg/dL (ref 0.4–1.2)
Glucose, Bld: 94 mg/dL (ref 70–99)
Potassium: 4.7 mEq/L (ref 3.5–5.1)

## 2012-11-29 LAB — LIPID PANEL
HDL: 63 mg/dL (ref >39.00)
LDL Cholesterol: 91 mg/dL (ref 0–99)
VLDL: 19.4 mg/dL (ref 0.0–40.0)

## 2012-11-29 MED ORDER — ZOLPIDEM TARTRATE 5 MG PO TABS: 2.5000 mg | ORAL_TABLET | Freq: Every evening | ORAL | Status: DC | PRN

## 2012-11-29 MED ORDER — GABAPENTIN 100 MG PO CAPS: 100.0000 mg | ORAL_CAPSULE | Freq: Three times a day (TID) | ORAL | Status: DC

## 2012-11-29 NOTE — Assessment & Plan Note (Signed)
General medical exam including breast exam normal today. Pap and pelvic deferred per patient preference based on history of previous normal Pap, last Pap in 2011 showed atrophy. Colonoscopy is up to date. Immunizations are up-to-date. Will check labs today including CBC, CMP, lipid profile. Appropriate screening performed.

## 2012-11-29 NOTE — Progress Notes (Signed)
Subjective:    Patient ID: Amanda Robbins, female    DOB: November 26, 1940, 72 y.o.   MRN: [id number]  HPI The patient is here for annual Medicare wellness examination and management of other chronic and acute problems.   The risk factors are reflected in the social history.  The roster of all physicians providing medical care to patient - is listed in the Snapshot section of the chart.  Activities of daily living:  The patient is 100% independent in all ADLs: dressing, toileting, feeding as well as independent mobility  Home safety : The patient has smoke detectors in the home. They wear seatbelts.  There are no firearms at home. There is no violence in the home.   There is no risks for hepatitis, STDs or HIV. There is no history of blood transfusion. They have no travel history to infectious disease endemic areas of the world.  The patient has seen their dentist in the last six month. Dr. Joesphine Bare They have seen their eye doctor in the last year. Dr. Fransico Michael No issues with hearing. They have deferred audiologic testing in the last year.   They do not  have excessive sun exposure. Discussed the need for sun protection: hats, long sleeves and use of sunscreen if there is significant sun exposure. Dr. Gwen Pounds  Diet: the importance of a healthy diet is discussed. They do have a healthy diet.  The benefits of regular aerobic exercise were discussed. She walks. Limited by neuropathy.  Depression screen: there are no signs or vegative symptoms of depression- irritability, change in appetite, anhedonia, sadness/tearfullness.  Cognitive assessment: the patient manages all their financial and personal affairs and is actively engaged. They could relate day,date,year and events.  The following portions of the patient's history were reviewed and updated as appropriate: allergies, current medications, past family history, past medical history,  past surgical history, past social history  and problem  list.  Visual acuity was not assessed per patient preference since she has regular follow up with her ophthalmologist. Hearing and body mass index were assessed and reviewed.   During the course of the visit the patient was educated and counseled about appropriate screening and preventive services including : fall prevention , diabetes screening, nutrition counseling, colorectal cancer screening, and recommended immunizations.    She continues to have burning pain in both of her lower extremities consistent with chronic neuropathy. This has been evaluated in the past by neurology. She is currently taking Neurontin 100 mg typically once per day with minimal improvement. She has not tried other medications such as Lyrica or Cymbalta. She does not have followup scheduled with neurology.   Outpatient Encounter Prescriptions as of 11/29/2012  Medication Sig Dispense Refill  . Calcium Carb-Cholecalciferol (CALCIUM 1000 + D PO) Take by mouth daily.        Marland Kitchen gabapentin (NEURONTIN) 100 MG capsule Take 1 capsule (100 mg total) by mouth 3 (three) times daily.  90 capsule  3  . Multiple Vitamin (MULTIVITAMIN) tablet Take 1 tablet by mouth daily.        Marland Kitchen topiramate (TOPAMAX) 25 MG tablet take 1 to 2 tablets by mouth daily as directed  60 tablet  0  . traMADol (ULTRAM) 50 MG tablet Take 1 tablet (50 mg total) by mouth every 6 (six) hours as needed.  30 tablet  6  . zolpidem (AMBIEN) 5 MG tablet Take 0.5-1 tablets (2.5-5 mg total) by mouth at bedtime as needed.  30 tablet  4  .  rizatriptan (MAXALT) 10 MG tablet Take 10 mg by mouth as needed. May repeat in 2 hours if needed        No facility-administered encounter medications on file as of 11/29/2012.   BP 130/84  Pulse 67  Temp(Src) 98 F (36.7 C) (Oral)  Ht 5\' 6"  (1.676 m)  Wt 136 lb (61.689 kg)  BMI 21.96 kg/m2  SpO2 97%  Review of Systems  Constitutional: Negative for fever, chills, appetite change, fatigue and unexpected weight change.  HENT:  Negative for ear pain, congestion, sore throat, trouble swallowing, neck pain, voice change and sinus pressure.   Eyes: Negative for visual disturbance.  Respiratory: Negative for cough, shortness of breath, wheezing and stridor.   Cardiovascular: Negative for chest pain, palpitations and leg swelling.  Gastrointestinal: Negative for nausea, vomiting, abdominal pain, diarrhea, constipation, blood in stool, abdominal distention and anal bleeding.  Genitourinary: Negative for dysuria and flank pain.  Musculoskeletal: Negative for myalgias, arthralgias and gait problem.  Skin: Negative for color change and rash.  Neurological: Negative for dizziness and headaches.  Hematological: Negative for adenopathy. Does not bruise/bleed easily.  Psychiatric/Behavioral: Negative for suicidal ideas, sleep disturbance and dysphoric mood. The patient is not nervous/anxious.        Objective:   Physical Exam  Constitutional: She is oriented to person, place, and time. She appears well-developed and well-nourished. No distress.  HENT:  Head: Normocephalic and atraumatic.  Right Ear: External ear normal.  Left Ear: External ear normal.  Nose: Nose normal.  Mouth/Throat: Oropharynx is clear and moist. No oropharyngeal exudate.  Eyes: Conjunctivae are normal. Pupils are equal, round, and reactive to light. Right eye exhibits no discharge. Left eye exhibits no discharge. No scleral icterus.  Neck: Normal range of motion. Neck supple. No tracheal deviation present. No thyromegaly present.  Cardiovascular: Normal rate, regular rhythm, normal heart sounds and intact distal pulses.  Exam reveals no gallop and no friction rub.   No murmur heard. Pulmonary/Chest: Effort normal and breath sounds normal. No accessory muscle usage. Not tachypneic. No respiratory distress. She has no decreased breath sounds. She has no wheezes. She has no rhonchi. She has no rales. She exhibits no tenderness. Right breast exhibits no  inverted nipple, no mass, no nipple discharge, no skin change and no tenderness. Left breast exhibits no inverted nipple, no mass, no nipple discharge, no skin change and no tenderness. Breasts are symmetrical.  Abdominal: Soft. Bowel sounds are normal. She exhibits no distension and no mass. There is no tenderness. There is no rebound and no guarding.  Musculoskeletal: Normal range of motion. She exhibits no edema and no tenderness.  Lymphadenopathy:    She has no cervical adenopathy.  Neurological: She is alert and oriented to person, place, and time. No cranial nerve deficit. She exhibits normal muscle tone. Coordination normal.  Skin: Skin is warm and dry. No rash noted. She is not diaphoretic. No erythema. No pallor.  Psychiatric: She has a normal mood and affect. Her behavior is normal. Judgment and thought content normal.          Assessment & Plan:

## 2012-11-29 NOTE — Assessment & Plan Note (Signed)
Encouraged her to increase Neurontin to 100 mg 3 times daily. We discussed potentially increasing in medication dose further and potentially changing to other medications such as Lyrica or Cymbalta. She will call with an update in 2 weeks. Followup in 4 weeks.

## 2012-12-03 ENCOUNTER — Encounter: Payer: Self-pay | Admitting: Emergency Medicine

## 2012-12-17 ENCOUNTER — Ambulatory Visit: Payer: Self-pay | Admitting: Internal Medicine

## 2012-12-27 ENCOUNTER — Encounter: Payer: Self-pay | Admitting: Internal Medicine

## 2012-12-27 LAB — HM MAMMOGRAPHY: HM Mammogram: NORMAL

## 2013-01-15 ENCOUNTER — Other Ambulatory Visit: Payer: Self-pay | Admitting: Internal Medicine

## 2013-01-15 NOTE — Telephone Encounter (Signed)
Okay to refill? 

## 2013-01-27 ENCOUNTER — Telehealth: Payer: Self-pay | Admitting: *Deleted

## 2013-01-27 ENCOUNTER — Ambulatory Visit (INDEPENDENT_AMBULATORY_CARE_PROVIDER_SITE_OTHER): Payer: Medicare Other | Admitting: Internal Medicine

## 2013-01-27 ENCOUNTER — Encounter: Payer: Self-pay | Admitting: Internal Medicine

## 2013-01-27 VITALS — BP 100/62 | HR 78 | Temp 98.5°F | Wt 135.0 lb

## 2013-01-27 DIAGNOSIS — G589 Mononeuropathy, unspecified: Secondary | ICD-10-CM

## 2013-01-27 DIAGNOSIS — K589 Irritable bowel syndrome without diarrhea: Secondary | ICD-10-CM

## 2013-01-27 DIAGNOSIS — G43909 Migraine, unspecified, not intractable, without status migrainosus: Secondary | ICD-10-CM

## 2013-01-27 DIAGNOSIS — G629 Polyneuropathy, unspecified: Secondary | ICD-10-CM

## 2013-01-27 MED ORDER — GABAPENTIN 100 MG PO CAPS: 200.0000 mg | ORAL_CAPSULE | Freq: Three times a day (TID) | ORAL | Status: DC

## 2013-01-27 NOTE — Telephone Encounter (Signed)
She can have Prolia shot now.

## 2013-01-27 NOTE — Progress Notes (Signed)
Subjective:    Patient ID: Amanda Robbins, female    DOB: 1940-10-31, 72 y.o.   MRN: [id number]  HPI 72 year old female with history of migraine headaches, neuropathy, and irritable bowel syndrome presents for followup. She reports she is generally feeling well. She continues to have burning pain in her lower extremities and occasional cramping in her anterior lower legs. She has been taking Neurontin 100 mg 3 times daily with minimal improvement. She also occasionally takes tramadol with minimal improvement.  She reports symptoms of irritable bowel syndrome have been well-controlled. No recent episodes of diarrhea. She denies any abdominal pain or cramping. She denies blood in her stool.  She also reports migraines have been well-controlled with use of Topamax.  Outpatient Encounter Prescriptions as of 01/27/2013  Medication Sig Dispense Refill  . Calcium Carb-Cholecalciferol (CALCIUM 1000 + D PO) Take by mouth daily.        Marland Kitchen gabapentin (NEURONTIN) 100 MG capsule Take 2 capsules (200 mg total) by mouth 3 (three) times daily.  180 capsule  3  . Multiple Vitamin (MULTIVITAMIN) tablet Take 1 tablet by mouth daily.        Marland Kitchen topiramate (TOPAMAX) 25 MG tablet take 1 to 2 tablets by mouth daily as directed  60 tablet  0  . traMADol (ULTRAM) 50 MG tablet take 1 tablet by mouth every 6 hours AS NEEDED  30 tablet  4  . zolpidem (AMBIEN) 5 MG tablet Take 0.5-1 tablets (2.5-5 mg total) by mouth at bedtime as needed.  30 tablet  4   No facility-administered encounter medications on file as of 01/27/2013.   BP 100/62  Pulse 78  Temp(Src) 98.5 F (36.9 C) (Oral)  Wt 135 lb (61.236 kg)  BMI 21.8 kg/m2  SpO2 96%  Review of Systems  Constitutional: Negative for fever, chills, appetite change, fatigue and unexpected weight change.  HENT: Negative for ear pain, congestion, sore throat, trouble swallowing, neck pain, voice change and sinus pressure.   Eyes: Negative for visual disturbance.  Respiratory:  Negative for cough, shortness of breath, wheezing and stridor.   Cardiovascular: Negative for chest pain, palpitations and leg swelling.  Gastrointestinal: Negative for nausea, vomiting, abdominal pain, diarrhea, constipation, blood in stool, abdominal distention and anal bleeding.  Genitourinary: Negative for dysuria and flank pain.  Musculoskeletal: Positive for myalgias. Negative for arthralgias and gait problem.  Skin: Negative for color change and rash.  Neurological: Negative for dizziness and headaches.  Hematological: Negative for adenopathy. Does not bruise/bleed easily.  Psychiatric/Behavioral: Negative for suicidal ideas, sleep disturbance and dysphoric mood. The patient is not nervous/anxious.        Objective:   Physical Exam  Constitutional: She is oriented to person, place, and time. She appears well-developed and well-nourished. No distress.  HENT:  Head: Normocephalic and atraumatic.  Right Ear: External ear normal.  Left Ear: External ear normal.  Nose: Nose normal.  Mouth/Throat: Oropharynx is clear and moist. No oropharyngeal exudate.  Eyes: Conjunctivae are normal. Pupils are equal, round, and reactive to light. Right eye exhibits no discharge. Left eye exhibits no discharge. No scleral icterus.  Neck: Normal range of motion. Neck supple. No tracheal deviation present. No thyromegaly present.  Cardiovascular: Normal rate, regular rhythm, normal heart sounds and intact distal pulses.  Exam reveals no gallop and no friction rub.   No murmur heard. Pulmonary/Chest: Effort normal and breath sounds normal. No accessory muscle usage. Not tachypneic. No respiratory distress. She has no decreased breath sounds. She has  no wheezes. She has no rhonchi. She has no rales. She exhibits no tenderness.  Musculoskeletal: Normal range of motion. She exhibits no edema and no tenderness.  Lymphadenopathy:    She has no cervical adenopathy.  Neurological: She is alert and oriented to  person, place, and time. No cranial nerve deficit. She exhibits normal muscle tone. Coordination normal.  Skin: Skin is warm and dry. No rash noted. She is not diaphoretic. No erythema. No pallor.  Psychiatric: She has a normal mood and affect. Her behavior is normal. Judgment and thought content normal.          Assessment & Plan:

## 2013-01-27 NOTE — Telephone Encounter (Signed)
Patient left a message stating she has an appointment next month, would like to know when her next Prolia shot is due. Has it been approved for her to get, she can come in at anytime to get it.

## 2013-01-27 NOTE — Assessment & Plan Note (Signed)
Migraines well controlled with use of Topamax. Will continue.

## 2013-01-27 NOTE — Assessment & Plan Note (Signed)
Symptomatically doing well. We'll continue to monitor.

## 2013-01-27 NOTE — Patient Instructions (Signed)
Increase neurontin to 200mg  three times daily for neuropathy.

## 2013-01-27 NOTE — Assessment & Plan Note (Signed)
Symptoms have recently worsened with poor control with current dose of Neurontin. Will try increasing dose of Neurontin to 200 mg 3 times daily. Followup in 4 weeks.

## 2013-01-29 NOTE — Telephone Encounter (Signed)
Left detailed message explaining to patient she can get it now if she would like, just call the office to schedule a nurse visit.

## 2013-02-20 ENCOUNTER — Encounter: Payer: Self-pay | Admitting: Internal Medicine

## 2013-02-20 ENCOUNTER — Ambulatory Visit (INDEPENDENT_AMBULATORY_CARE_PROVIDER_SITE_OTHER): Payer: Medicare Other | Admitting: Internal Medicine

## 2013-02-20 VITALS — BP 136/90 | HR 87 | Temp 98.3°F | Wt 132.0 lb

## 2013-02-20 DIAGNOSIS — J029 Acute pharyngitis, unspecified: Secondary | ICD-10-CM

## 2013-02-20 NOTE — Progress Notes (Signed)
Subjective:    Patient ID: Amanda Robbins, female    DOB: 1941/05/15, 72 y.o.   MRN: [id number]  HPI 72YO female presents for acute visit with 1 day h/o sore throat. Also having mild nasal congestion and occasional dry cough. Notes that voice is hoarse. No dyspnea, chest pain, or trouble swallowing. No fever or chills. Taking Tylenol with minimal improvement. Notes recently interacting with several toddlers who had viral URI.  Outpatient Prescriptions Prior to Visit  Medication Sig Dispense Refill  . Calcium Carb-Cholecalciferol (CALCIUM 1000 + D PO) Take by mouth daily.        . Multiple Vitamin (MULTIVITAMIN) tablet Take 1 tablet by mouth daily.        Marland Kitchen topiramate (TOPAMAX) 25 MG tablet take 1 to 2 tablets by mouth daily as directed  60 tablet  0  . traMADol (ULTRAM) 50 MG tablet take 1 tablet by mouth every 6 hours AS NEEDED  30 tablet  4  . zolpidem (AMBIEN) 5 MG tablet Take 0.5-1 tablets (2.5-5 mg total) by mouth at bedtime as needed.  30 tablet  4  . gabapentin (NEURONTIN) 100 MG capsule Take 2 capsules (200 mg total) by mouth 3 (three) times daily.  180 capsule  3   No facility-administered medications prior to visit.   BP 136/90  Pulse 87  Temp(Src) 98.3 F (36.8 C) (Oral)  Wt 132 lb (59.875 kg)  BMI 21.32 kg/m2  SpO2 95%  Review of Systems  Constitutional: Positive for fatigue. Negative for fever, chills and unexpected weight change.  HENT: Positive for congestion, sore throat and voice change (hoarse). Negative for hearing loss, ear pain, nosebleeds, facial swelling, rhinorrhea, sneezing, mouth sores, trouble swallowing, neck pain, neck stiffness, postnasal drip, sinus pressure, tinnitus and ear discharge.   Eyes: Negative for pain, discharge, redness and visual disturbance.  Respiratory: Positive for cough. Negative for chest tightness, shortness of breath, wheezing and stridor.   Cardiovascular: Negative for chest pain, palpitations and leg swelling.  Musculoskeletal:  Negative for myalgias and arthralgias.  Skin: Negative for color change and rash.  Neurological: Negative for dizziness, weakness, light-headedness and headaches.  Hematological: Negative for adenopathy.       Objective:   Physical Exam  Constitutional: She is oriented to person, place, and time. She appears well-developed and well-nourished. No distress.  HENT:  Head: Normocephalic and atraumatic.  Right Ear: External ear normal.  Left Ear: External ear normal.  Nose: Nose normal.  Mouth/Throat: Posterior oropharyngeal erythema present. No oropharyngeal exudate.  Eyes: Conjunctivae are normal. Pupils are equal, round, and reactive to light. Right eye exhibits no discharge. Left eye exhibits no discharge. No scleral icterus.  Neck: Normal range of motion. Neck supple. No tracheal deviation present. No thyromegaly present.  Cardiovascular: Normal rate, regular rhythm, normal heart sounds and intact distal pulses.  Exam reveals no gallop and no friction rub.   No murmur heard. Pulmonary/Chest: Effort normal and breath sounds normal. No accessory muscle usage. Not tachypneic. No respiratory distress. She has no decreased breath sounds. She has no wheezes. She has no rhonchi. She has no rales. She exhibits no tenderness.  Musculoskeletal: Normal range of motion. She exhibits no edema and no tenderness.  Lymphadenopathy:    She has no cervical adenopathy.  Neurological: She is alert and oriented to person, place, and time. No cranial nerve deficit. She exhibits normal muscle tone. Coordination normal.  Skin: Skin is warm and dry. No rash noted. She is not diaphoretic. No erythema.  No pallor.  Psychiatric: She has a normal mood and affect. Her behavior is normal. Judgment and thought content normal.          Assessment & Plan:

## 2013-02-20 NOTE — Assessment & Plan Note (Signed)
Symptoms most consistent with acute viral pharyngitis. Rapid strep negative today. Recommended rest, increased fluid intake, tylenol as needed for throat pain. Pt will call if symptoms are worsening, trouble swallowing or other concerns. Follow up next week as scheduled.

## 2013-02-20 NOTE — Patient Instructions (Signed)
Viral Pharyngitis Viral pharyngitis is a viral infection that produces redness, pain, and swelling (inflammation) of the throat. It can spread from person to person (contagious). CAUSES Viral pharyngitis is caused by inhaling a large amount of certain germs called viruses. Many different viruses cause viral pharyngitis. SYMPTOMS Symptoms of viral pharyngitis include:  Sore throat.  Tiredness.  Stuffy nose.  Low-grade fever.  Congestion.  Cough. TREATMENT Treatment includes rest, drinking plenty of fluids, and the use of over-the-counter medication (approved by your caregiver). HOME CARE INSTRUCTIONS   Drink enough fluids to keep your urine clear or pale yellow.  Eat soft, cold foods such as ice cream, frozen ice pops, or gelatin dessert.  Gargle with warm salt water (1 tsp salt per 1 qt of water).  If over age 7, throat lozenges may be used safely.  Only take over-the-counter or prescription medicines for pain, discomfort, or fever as directed by your caregiver. Do not take aspirin. To help prevent spreading viral pharyngitis to others, avoid:  Mouth-to-mouth contact with others.  Sharing utensils for eating and drinking.  Coughing around others. SEEK MEDICAL CARE IF:   You are better in a few days, then become worse.  You have a fever or pain not helped by pain medicines.  There are any other changes that concern you. Document Released: 03/08/2005 Document Revised: 08/21/2011 Document Reviewed: 08/04/2010 [employer] Patient Information 2014 [employer].  

## 2013-02-25 ENCOUNTER — Encounter: Payer: Self-pay | Admitting: *Deleted

## 2013-02-26 ENCOUNTER — Ambulatory Visit (INDEPENDENT_AMBULATORY_CARE_PROVIDER_SITE_OTHER): Payer: Medicare Other | Admitting: Internal Medicine

## 2013-02-26 ENCOUNTER — Encounter: Payer: Self-pay | Admitting: Internal Medicine

## 2013-02-26 VITALS — BP 120/80 | HR 70 | Temp 98.3°F

## 2013-02-26 DIAGNOSIS — G629 Polyneuropathy, unspecified: Secondary | ICD-10-CM

## 2013-02-26 DIAGNOSIS — M810 Age-related osteoporosis without current pathological fracture: Secondary | ICD-10-CM

## 2013-02-26 DIAGNOSIS — G589 Mononeuropathy, unspecified: Secondary | ICD-10-CM

## 2013-02-26 MED ORDER — DENOSUMAB 60 MG/ML ~~LOC~~ SOLN
60.0000 mg | Freq: Once | SUBCUTANEOUS | Status: AC
  Administered 2013-02-26: 60 mg via SUBCUTANEOUS

## 2013-02-26 NOTE — Progress Notes (Signed)
Subjective:    Patient ID: Amanda Robbins, female    DOB: Oct 22, 1940, 72 y.o.   MRN: [id number]  HPI 72 year old female with history of peripheral neuropathy, IBS, insomnia, osteoporosis presents for followup. In regards to neuropathy, she reports that symptoms were poorly controlled with Neurontin 100 mg twice daily. She tried to increase dose as instructed however developed worsening symptoms of IBS with crampy abdominal pain. She has not been able to find medication but she can tolerate for neuropathy. She continues to have burning pain in both of her feet and aching pain in her left ankle secondary to plantar fasciitis. She recently underwent a steroid injection into her left foot with her podiatrist with no improvement. She has been debating starting acupuncture to better control her symptoms.  She also notes she is due for Prolia injection. This will be her third dose of Prolia. Last bone density test was in 01/2011. She does take Ca and Vit D supplement.   Outpatient Encounter Prescriptions as of 02/26/2013  Medication Sig Dispense Refill  . Calcium Carb-Cholecalciferol (CALCIUM 1000 + D PO) Take by mouth daily.        . Multiple Vitamin (MULTIVITAMIN) tablet Take 1 tablet by mouth daily.        Marland Kitchen oxymetazoline (AFRIN) 0.05 % nasal spray Place 2 sprays into the nose 2 (two) times daily.      Marland Kitchen topiramate (TOPAMAX) 25 MG tablet take 1 to 2 tablets by mouth daily as directed  60 tablet  0  . UNABLE TO FIND Gr8-Dophilus      . zolpidem (AMBIEN) 5 MG tablet Take 0.5-1 tablets (2.5-5 mg total) by mouth at bedtime as needed.  30 tablet  4   No facility-administered encounter medications on file as of 02/26/2013.   BP 120/80  Pulse 70  Temp(Src) 98.3 F (36.8 C) (Oral)  SpO2 97%  Review of Systems  Constitutional: Negative for fever, chills, appetite change, fatigue and unexpected weight change.  HENT: Negative for ear pain, congestion, sore throat, trouble swallowing, neck pain, voice change  and sinus pressure.   Eyes: Negative for visual disturbance.  Respiratory: Negative for cough, shortness of breath, wheezing and stridor.   Cardiovascular: Negative for chest pain, palpitations and leg swelling.  Gastrointestinal: Negative for nausea, vomiting, abdominal pain, diarrhea, constipation, blood in stool, abdominal distention and anal bleeding.  Genitourinary: Negative for dysuria and flank pain.  Musculoskeletal: Positive for myalgias. Negative for arthralgias and gait problem.  Skin: Negative for color change and rash.  Neurological: Negative for dizziness and headaches.  Hematological: Negative for adenopathy. Does not bruise/bleed easily.  Psychiatric/Behavioral: Negative for suicidal ideas, sleep disturbance and dysphoric mood. The patient is not nervous/anxious.        Objective:   Physical Exam  Constitutional: She is oriented to person, place, and time. She appears well-developed and well-nourished. No distress.  HENT:  Head: Normocephalic and atraumatic.  Right Ear: External ear normal.  Left Ear: External ear normal.  Nose: Nose normal.  Mouth/Throat: Oropharynx is clear and moist. No oropharyngeal exudate.  Eyes: Conjunctivae are normal. Pupils are equal, round, and reactive to light. Right eye exhibits no discharge. Left eye exhibits no discharge. No scleral icterus.  Neck: Normal range of motion. Neck supple. No tracheal deviation present. No thyromegaly present.  Cardiovascular: Normal rate, regular rhythm, normal heart sounds and intact distal pulses.  Exam reveals no gallop and no friction rub.   No murmur heard. Pulmonary/Chest: Effort normal and breath sounds  normal. No accessory muscle usage. Not tachypneic. No respiratory distress. She has no decreased breath sounds. She has no wheezes. She has no rhonchi. She has no rales. She exhibits no tenderness.  Musculoskeletal: Normal range of motion. She exhibits no edema and no tenderness.  Lymphadenopathy:     She has no cervical adenopathy.  Neurological: She is alert and oriented to person, place, and time. No cranial nerve deficit. She exhibits normal muscle tone. Coordination normal.  Skin: Skin is warm and dry. No rash noted. She is not diaphoretic. No erythema. No pallor.  Psychiatric: She has a normal mood and affect. Her behavior is normal. Judgment and thought content normal.          Assessment & Plan:

## 2013-02-26 NOTE — Assessment & Plan Note (Signed)
Persistent symptoms of burning pain consistent with neuropathy. Patient has been unable to tolerate Neurontin because of worsening of IBS symptoms. Patient prefers to try acupuncture rather than starting additional medications at this time. Will follow.

## 2013-02-26 NOTE — Assessment & Plan Note (Signed)
Prolia given today. Plan to repeat bone density testing early 2015.

## 2013-03-18 ENCOUNTER — Other Ambulatory Visit: Payer: Self-pay | Admitting: Internal Medicine

## 2013-05-26 ENCOUNTER — Ambulatory Visit (INDEPENDENT_AMBULATORY_CARE_PROVIDER_SITE_OTHER): Payer: Medicare Other | Admitting: Internal Medicine

## 2013-05-26 ENCOUNTER — Encounter: Payer: Self-pay | Admitting: Internal Medicine

## 2013-05-26 VITALS — BP 110/78 | HR 66 | Temp 97.9°F | Wt 132.0 lb

## 2013-05-26 DIAGNOSIS — R42 Dizziness and giddiness: Secondary | ICD-10-CM | POA: Insufficient documentation

## 2013-05-26 DIAGNOSIS — G629 Polyneuropathy, unspecified: Secondary | ICD-10-CM

## 2013-05-26 DIAGNOSIS — G589 Mononeuropathy, unspecified: Secondary | ICD-10-CM

## 2013-05-26 DIAGNOSIS — G43909 Migraine, unspecified, not intractable, without status migrainosus: Secondary | ICD-10-CM

## 2013-05-26 LAB — COMPREHENSIVE METABOLIC PANEL
AST: 29 U/L (ref 0–37)
Alkaline Phosphatase: 44 U/L (ref 39–117)
BUN: 18 mg/dL (ref 6–23)
Creatinine, Ser: 1 mg/dL (ref 0.4–1.2)
Glucose, Bld: 83 mg/dL (ref 70–99)
Potassium: 4.7 mEq/L (ref 3.5–5.1)
Total Bilirubin: 1 mg/dL (ref 0.3–1.2)

## 2013-05-26 LAB — CBC WITH DIFFERENTIAL/PLATELET
Basophils Relative: 0.4 % (ref 0.0–3.0)
Eosinophils Absolute: 0.2 10*3/uL (ref 0.0–0.7)
Eosinophils Relative: 3.8 % (ref 0.0–5.0)
HCT: 42.2 % (ref 36.0–46.0)
Hemoglobin: 14.4 g/dL (ref 12.0–15.0)
Lymphs Abs: 1.6 10*3/uL (ref 0.7–4.0)
MCHC: 34.2 g/dL (ref 30.0–36.0)
MCV: 93.6 fl (ref 78.0–100.0)
Monocytes Absolute: 0.3 10*3/uL (ref 0.1–1.0)
Neutro Abs: 2.5 10*3/uL (ref 1.4–7.7)
Neutrophils Relative %: 53.8 % (ref 43.0–77.0)
RBC: 4.51 Mil/uL (ref 3.87–5.11)
WBC: 4.6 10*3/uL (ref 4.5–10.5)

## 2013-05-26 LAB — TSH: TSH: 2.82 u[IU]/mL (ref 0.35–5.50)

## 2013-05-26 NOTE — Progress Notes (Signed)
Pre-visit discussion using our clinic review tool. No additional management support is needed unless otherwise documented below in the visit note.  

## 2013-05-26 NOTE — Assessment & Plan Note (Signed)
Symptoms well controlled with use of acupuncture. Will continue

## 2013-05-26 NOTE — Assessment & Plan Note (Signed)
Migraine symptoms well controlled Topamax. Will continue.

## 2013-05-26 NOTE — Progress Notes (Signed)
Subjective:    Patient ID: Amanda Robbins, female    DOB: Jul 09, 1940, 72 y.o.   MRN: [id number]  HPI 72YO female with h/o migraines and neuropathy presents for follow up. She is generally feeling well. Symptoms of pain in her legs have been improved with recent accupuncture. Migraines have been well controlled with topamax with no recent headaches.   Her primary concern today is intermittent symptoms of dizziness described as lightheadedness. She has had extensive evaluation for this including MRI brain, carotid Dopplers, cardiac stress test, and labs all of which have been normal. She reports that her mother had similar symptoms. She has been taking Dramamine as needed for symptoms with some improvement. She denies any focal neurologic deficits. She denies any recent falls. She has not had any recent syncope.  Outpatient Encounter Prescriptions as of 05/26/2013  Medication Sig  . Calcium Carb-Cholecalciferol (CALCIUM 1000 + D PO) Take by mouth daily.    . DimenhyDRINATE (DRAMAMINE PO) Take by mouth.  . Multiple Vitamin (MULTIVITAMIN) tablet Take 1 tablet by mouth daily.    Marland Kitchen oxymetazoline (AFRIN) 0.05 % nasal spray Place 2 sprays into the nose 2 (two) times daily.  Marland Kitchen topiramate (TOPAMAX) 25 MG tablet take 1 to 2 tablets by mouth once daily AS DIRECTED  . zolpidem (AMBIEN) 5 MG tablet Take 0.5-1 tablets (2.5-5 mg total) by mouth at bedtime as needed.  . [DISCONTINUED] UNABLE TO FIND Gr8-Dophilus   BP 110/78  Pulse 66  Temp(Src) 97.9 F (36.6 C) (Oral)  Wt 132 lb (59.875 kg)  SpO2 98%  Review of Systems  Constitutional: Negative for fever, chills, appetite change, fatigue and unexpected weight change.  HENT: Negative for congestion, ear pain, sinus pressure, sore throat, trouble swallowing and voice change.   Eyes: Negative for visual disturbance.  Respiratory: Negative for cough, shortness of breath, wheezing and stridor.   Cardiovascular: Negative for chest pain, palpitations and leg  swelling.  Gastrointestinal: Negative for nausea, vomiting, abdominal pain, diarrhea, constipation, blood in stool, abdominal distention and anal bleeding.  Genitourinary: Negative for dysuria and flank pain.  Musculoskeletal: Negative for arthralgias, gait problem, myalgias and neck pain.  Skin: Negative for color change and rash.  Neurological: Positive for dizziness and light-headedness. Negative for headaches.  Hematological: Negative for adenopathy. Does not bruise/bleed easily.  Psychiatric/Behavioral: Negative for suicidal ideas, sleep disturbance and dysphoric mood. The patient is not nervous/anxious.        Objective:   Physical Exam  Constitutional: She is oriented to person, place, and time. She appears well-developed and well-nourished. No distress.  HENT:  Head: Normocephalic and atraumatic.  Right Ear: External ear normal.  Left Ear: External ear normal.  Nose: Nose normal.  Mouth/Throat: Oropharynx is clear and moist. No oropharyngeal exudate.  Eyes: Conjunctivae are normal. Pupils are equal, round, and reactive to light. Right eye exhibits no discharge. Left eye exhibits no discharge. No scleral icterus.  Neck: Normal range of motion. Neck supple. No tracheal deviation present. No thyromegaly present.  Cardiovascular: Normal rate, regular rhythm, normal heart sounds and intact distal pulses.  Exam reveals no gallop and no friction rub.   No murmur heard. Pulmonary/Chest: Effort normal and breath sounds normal. No accessory muscle usage. Not tachypneic. No respiratory distress. She has no decreased breath sounds. She has no wheezes. She has no rhonchi. She has no rales. She exhibits no tenderness.  Musculoskeletal: Normal range of motion. She exhibits no edema and no tenderness.  Lymphadenopathy:    She  has no cervical adenopathy.  Neurological: She is alert and oriented to person, place, and time. No cranial nerve deficit. She exhibits normal muscle tone. Coordination  normal.  Skin: Skin is warm and dry. No rash noted. She is not diaphoretic. No erythema. No pallor.  Psychiatric: She has a normal mood and affect. Her behavior is normal. Judgment and thought content normal.          Assessment & Plan:

## 2013-05-26 NOTE — Assessment & Plan Note (Signed)
Intermittent episodes of lightheadedness which are chronic for her. Extensive evaluation including MRI brain, carotid Dopplers, cardiac stress tests all of normal. Exam is normal today. Suspect benign positional vertigo. Will continue trimming as needed as this works well for her. She will call if any change in symptoms.

## 2013-06-06 ENCOUNTER — Telehealth: Payer: Self-pay | Admitting: Internal Medicine

## 2013-06-06 NOTE — Telephone Encounter (Signed)
Spoke with patient, she did call her dentist office but no one has returned her call yet. Advised patient if she does not get a return call today then she really needs to go to an urgent care or ED as the triage nurse advised her to earlier. Per patient she will but the knot is going down in size.

## 2013-06-06 NOTE — Telephone Encounter (Signed)
Patient Information:  Caller Name: Amanda Robbins  Phone: [phone]  Patient: Amanda Robbins  Gender: Female  DOB: Feb 27, 1941  Age: 72 Years  PCP: Ronna Polio (Adults only)  Office Follow Up:  Does the office need to follow up with this patient?: Yes  Instructions For The Office: Please see note  RN Note:  Triaged pt and received a disp of "go to the office now"  All appointments are full at this time.  Advised pt to call her Dentist, although the office is not open, they should have a dentist that is oncall, if he will not call her in an antibiotic, she should go to her nearest [facility] in order to be see and evaluated.   Please f/u with pt if you feel you can work her in, or Dr. Dan Humphreys would like to call patient in antibiotics.  Patient also states that she has Mitral Valve Prolapse, however, she does not take antibiotics before she goes to the dentist "anymore"  She had some left over Cipro from a bladder infection that she began taking Christmas Eve and the Lymph node seems to be getting much smaller.  She reports that the antibiotic is "Cipro"  Thanks  Symptoms  Reason For Call & Symptoms: Had her teeth cleaned 06/03/2013 and noted a knot the size of an egg in her jaw the next day. Her dentisty is not in and she wants to know if she should get an antibiotic.  She reports that slowly it is going down in size and it is currently the size of an olive.  Reviewed Health History In EMR: Yes  Reviewed Medications In EMR: Yes  Reviewed Allergies In EMR: Yes  Reviewed Surgeries / Procedures: Yes  Date of Onset of Symptoms: 06/04/2013  Guideline(s) Used:  Lymph Nodes - Swollen  Disposition Per Guideline:   Go to Office Now  Reason For Disposition Reached:   Single large node and size > 1 inch (2.5 cm)  Advice Given:  N/A  Patient Will Follow Care Advice:  YES

## 2013-06-13 ENCOUNTER — Telehealth: Payer: Self-pay | Admitting: *Deleted

## 2013-06-13 NOTE — Telephone Encounter (Signed)
PA request form placed in Dr.walkers folder

## 2013-06-13 NOTE — Telephone Encounter (Signed)
Called [phone] for prior authorization on the zolpidem tab 5 mg, PA request form is being faxed over

## 2013-06-16 NOTE — Telephone Encounter (Signed)
PA response to authorization on the Zolpiden Tartrate was been APPROVED [id number]

## 2013-06-27 ENCOUNTER — Other Ambulatory Visit: Payer: Self-pay | Admitting: *Deleted

## 2013-06-27 ENCOUNTER — Other Ambulatory Visit: Payer: Self-pay | Admitting: Internal Medicine

## 2013-06-27 MED ORDER — ZOLPIDEM TARTRATE 5 MG PO TABS: ORAL_TABLET | ORAL | Status: DC

## 2013-10-01 NOTE — Telephone Encounter (Signed)
Patient had prolia injection on 01/30/12.

## 2013-10-20 ENCOUNTER — Telehealth: Payer: Self-pay | Admitting: Internal Medicine

## 2013-10-20 NOTE — Telephone Encounter (Signed)
Amanda Robbins pt called to schedule her prolia shot.  Please let me know when this has been approved with her insurance Thanks Shirlean Mylar

## 2013-10-21 NOTE — Telephone Encounter (Signed)
Sent info to [employer] for insurance verification. Will notify you as soon as I get a response. Thank you.

## 2013-10-23 NOTE — Telephone Encounter (Signed)
Received insurance verification from [employer]. Pt has a $0 copay plus any applicable deductible for her injection. Please let me know if you have further questions. Thank you.

## 2013-10-24 NOTE — Telephone Encounter (Signed)
Prolia ordered

## 2013-10-24 NOTE — Telephone Encounter (Signed)
Schedule patient appt for 5/29 at [facility] please order. thx

## 2013-10-28 ENCOUNTER — Other Ambulatory Visit: Payer: Self-pay | Admitting: Internal Medicine

## 2013-10-28 NOTE — Telephone Encounter (Signed)
LAst visit 12/14, refill?

## 2013-11-06 ENCOUNTER — Ambulatory Visit (INDEPENDENT_AMBULATORY_CARE_PROVIDER_SITE_OTHER): Payer: Medicare Other | Admitting: Podiatrist

## 2013-11-06 ENCOUNTER — Other Ambulatory Visit: Payer: Self-pay | Admitting: Podiatrist

## 2013-11-06 ENCOUNTER — Telehealth: Payer: Self-pay | Admitting: *Deleted

## 2013-11-06 ENCOUNTER — Encounter: Payer: Self-pay | Admitting: Podiatrist

## 2013-11-06 VITALS — BP 109/59 | HR 94 | Resp 16

## 2013-11-06 DIAGNOSIS — M25579 Pain in unspecified ankle and joints of unspecified foot: Secondary | ICD-10-CM

## 2013-11-06 DIAGNOSIS — M722 Plantar fascial fibromatosis: Secondary | ICD-10-CM

## 2013-11-06 DIAGNOSIS — M7750 Other enthesopathy of unspecified foot: Secondary | ICD-10-CM

## 2013-11-06 NOTE — Progress Notes (Signed)
Chief Complaint  Patient presents with  . Foot Pain    its my left heel again     HPI: Patient is 73 y.o. female who presents today for multiple problems on her left foot arch and heel. She also states she has pain along the anterior lateral tibia. She states that she's going to a chiropractor for neuropathy treatments. She also states that she's had long-term pain in her left ankle that feels stiff and she wants to know what it is from. She relates that the heel pain has subsided however now she has mid arch pain. She wonders if it could be from plantar fasciitis. She also states she's had several injections into her feet and that now she's getting injections along with a TENS unit for the neuropathic pain. Overall she would like to know why her feet are hurting and if there is any treatment plan available.     Physical Exam GENERAL APPEARANCE: Alert, conversant. Appropriately groomed. No acute distress.  VASCULAR: Pedal pulses palpable at 1/4 DP and PT bilateral.  Capillary refill time is immediate to all digits,   NEUROLOGIC: sensation is intact epicritically and protectively to 5.07 monofilament at 3/5 sites bilateral. Mild neuropathic symptoms are noted. MUSCULOSKELETAL: Fat pad atrophy is noted to the left heel. No pain at the plantar fascial insertion however she does have midfoot arch pain. No decrease in stability of the ankle however she does complain of pain with dorsiflexion at the ankle joint..  DERMATOLOGIC: Fat pad atrophy is noted to the left foot ankle and heel. Otherwise skin color, texture, and turger are within normal limits.  No preulcerative lesions are seen, no interdigital maceration noted.  No open lesions present.    Assessment: Plantar fasciitis left arch, capsulitis, tendinitis left ankle  PLAN: The patient requested an MRI and I agree that this would help to figure out if there is any problem in the ankle and how to fix it. We will also get her foot MRI to decide of  plantar fasciitis is a contributing factor to hurt pain. In the meantime I will order her some compounding cream through [facility] and have it sent to her. We did not do an injection today as she has had multiple in the past and she's getting an MRI soon would like to avoid any kind of fluid in the foot. She'll be notified when the MRI report is ready. Of note she is an original patient of Dr. Milinda Pointer and would like to followup with him.

## 2013-11-06 NOTE — Telephone Encounter (Signed)
SPOKE WITH PT LETTING HER KNOW APPT AT [facility] [id number], [phone] IS SCHEDULED 6.9.15 ARRIVAL 9:00 FOR A 9:30 MRI. PT UNDERSTOOD.

## 2013-11-07 ENCOUNTER — Ambulatory Visit (INDEPENDENT_AMBULATORY_CARE_PROVIDER_SITE_OTHER): Payer: Medicare Other | Admitting: *Deleted

## 2013-11-07 DIAGNOSIS — M810 Age-related osteoporosis without current pathological fracture: Secondary | ICD-10-CM

## 2013-11-07 MED ORDER — DENOSUMAB 60 MG/ML ~~LOC~~ SOLN
60.0000 mg | Freq: Once | SUBCUTANEOUS | Status: AC
  Administered 2013-11-07: 60 mg via SUBCUTANEOUS

## 2013-11-17 ENCOUNTER — Other Ambulatory Visit: Payer: Medicare Other

## 2013-11-18 ENCOUNTER — Ambulatory Visit
Admission: RE | Admit: 2013-11-18 | Discharge: 2013-11-18 | Disposition: A | Discharge status: Home / Self Care | Payer: Medicare Other | Source: Ambulatory Visit | Attending: Podiatrist | Admitting: Podiatrist

## 2013-11-18 ENCOUNTER — Telehealth: Payer: Self-pay | Admitting: *Deleted

## 2013-11-18 DIAGNOSIS — M722 Plantar fascial fibromatosis: Secondary | ICD-10-CM

## 2013-11-18 DIAGNOSIS — M25579 Pain in unspecified ankle and joints of unspecified foot: Secondary | ICD-10-CM

## 2013-11-18 MED ORDER — GADOBENATE DIMEGLUMINE 529 MG/ML IV SOLN
12.0000 mL | Freq: Once | INTRAVENOUS | Status: AC | PRN
  Administered 2013-11-18: 12 mL via INTRAVENOUS

## 2013-11-18 NOTE — Telephone Encounter (Signed)
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  Both are non surgical treatments.  Possible options are physical therapy,  injection therapy or NSAIDS.            She requsted the MRI when she saw me last and is an original patient of Dr. Milinda Pointer-- you can relay the info over the phone.  With it being essentially a negative study I don't need to follow up for this.            Thanks!            E      ----- Message -----         From: Rad Results In Interface         Sent: 11/18/2013  11:52 AM           To: Trudie Buckler, DPM                   ------

## 2013-11-18 NOTE — Telephone Encounter (Signed)
I attempted to call the patient to give her Dr. Shaune Pollack response in regards to her MRI.  There was no answer nor was there a voicemail set up.

## 2013-11-24 NOTE — Telephone Encounter (Signed)
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  Both are non surgical treatments.  Possible options are physical therapy,  injection therapy or NSAIDS.            She requsted the MRI when she saw me last and is an original patient of Dr. Milinda Pointer-- you can relay the info over the phone.  With it being essentially a negative study I don't need to follow up for this.            Thanks!            E      ----- Message -----         From: Rad Results In Interface         Sent: 11/18/2013  11:52 AM           To: Trudie Buckler, DPM                   ------

## 2013-11-24 NOTE — Telephone Encounter (Signed)
I called and informed her that Dr. Valentina Lucks said the MRI just showed some degenerative Arthritic changes in the ankle and a minor irritation of a small bone near the base of the great toe.  Both are non-surgical treatments.  Can do physical therapy, injection therapy or prescribe an anti-inflammatory.  She said that is great to know that surgery is not needed.  She said she is already seeing a Restaurant manager, fast food and he is already making some adjustments, so I'll just stick to that.  She said thanks for calling.

## 2013-11-27 ENCOUNTER — Ambulatory Visit (INDEPENDENT_AMBULATORY_CARE_PROVIDER_SITE_OTHER): Payer: Medicare Other | Admitting: Podiatrist

## 2013-11-27 VITALS — BP 81/44 | HR 89 | Resp 16

## 2013-11-27 DIAGNOSIS — M25572 Pain in left ankle and joints of left foot: Secondary | ICD-10-CM

## 2013-11-27 DIAGNOSIS — M7750 Other enthesopathy of unspecified foot: Secondary | ICD-10-CM

## 2013-11-27 NOTE — Patient Instructions (Signed)
Hold the stretch for 30 seconds and Repeat Each stretch 5 times and complete the stretches 3 times a day    While completing these exercises, remember:   Restoring tissue flexibility helps normal motion to return to the joints. This allows healthier, less painful movement and activity.  An effective stretch should be held for at least 30 seconds.  A stretch should never be painful. You should only feel a gentle lengthening or release in the stretched tissue. STRETCH - Gastroc, Standing   Place hands on wall.  Extend right / left leg, keeping the front knee somewhat bent.  Slightly point your toes inward on your back foot.  Keeping your right / left heel on the floor and your knee straight, shift your weight toward the wall, not allowing your back to arch.  You should feel a gentle stretch in the right / left calf. Hold this position for __________ seconds. Repeat __________ times. Complete this stretch __________ times per day. STRETCH - Soleus, Standing   Place hands on wall.  Extend right / left leg, keeping the other knee somewhat bent.  Slightly point your toes inward on your back foot.  Keep your right / left heel on the floor, bend your back knee, and slightly shift your weight over the back leg so that you feel a gentle stretch deep in your back calf.  Hold this position for __________ seconds. Repeat __________ times. Complete this stretch __________ times per day. STRETCH - Gastrocsoleus, Standing  Note: This exercise can place a lot of stress on your foot and ankle. Please complete this exercise only if specifically instructed by your caregiver.   Place the ball of your right / left foot on a step, keeping your other foot firmly on the same step.  Hold on to the wall or a rail for balance.  Slowly lift your other foot, allowing your body weight to press your heel down over the edge of the step.  You should feel a stretch in your right / left calf.  Hold this  position for __________ seconds.  Repeat this exercise with a slight bend in your knee. Repeat __________ times. Complete this stretch __________ times per day.  STRENGTHENING EXERCISES - Achilles Tendinitis These exercises may help you when beginning to rehabilitate your injury. They may resolve your symptoms with or without further involvement from your physician, physical therapist or athletic trainer. While completing these exercises, remember:   Muscles can gain both the endurance and the strength needed for everyday activities through controlled exercises.  Complete these exercises as instructed by your physician, physical therapist or athletic trainer. Progress the resistance and repetitions only as guided.  You may experience muscle soreness or fatigue, but the pain or discomfort you are trying to eliminate should never worsen during these exercises. If this pain does worsen, stop and make certain you are following the directions exactly. If the pain is still present after adjustments, discontinue the exercise until you can discuss the trouble with your clinician. STRENGTH - Plantar-flexors   Sit with your right / left leg extended. Holding onto both ends of a rubber exercise band/tubing, loop it around the ball of your foot. Keep a slight tension in the band.  Slowly push your toes away from you, pointing them downward.  Hold this position for __________ seconds. Return slowly, controlling the tension in the band/tubing. Repeat __________ times. Complete this exercise __________ times per day.  STRENGTH - Plantar-flexors   Stand with your feet  shoulder width apart. Steady yourself with a wall or table using as little support as needed.  Keeping your weight evenly spread over the width of your feet, rise up on your toes.*  Hold this position for __________ seconds. Repeat __________ times. Complete this exercise __________ times per day.  *If this is too easy, shift your weight  toward your right / left leg until you feel challenged. Ultimately, you may be asked to do this exercise with your right / left foot only. STRENGTH - Plantar-flexors, Eccentric  Note: This exercise can place a lot of stress on your foot and ankle. Please complete this exercise only if specifically instructed by your caregiver.   Place the balls of your feet on a step. With your hands, use only enough support from a wall or rail to keep your balance.  Keep your knees straight and rise up on your toes.  Slowly shift your weight entirely to your right / left toes and pick up your opposite foot. Gently and with controlled movement, lower your weight through your right / left foot so that your heel drops below the level of the step. You will feel a slight stretch in the back of your calf at the end position.  Use the healthy leg to help rise up onto the balls of both feet, then lower weight only on the right / left leg again. Build up to 15 repetitions. Then progress to 3 consecutive sets of 15 repetitions.*  After completing the above exercise, complete the same exercise with a slight knee bend (about 30 degrees). Again, build up to 15 repetitions. Then progress to 3 consecutive sets of 15 repetitions.* Perform this exercise __________ times per day.  *When you easily complete 3 sets of 15, your physician, physical therapist or athletic trainer may advise you to add resistance by wearing a backpack filled with additional weight. STRENGTH - Plantar Flexors, Seated   Sit on a chair that allows your feet to rest flat on the ground. If necessary, sit at the edge of the chair.  Keeping your toes firmly on the ground, lift your right / left heel as far as you can without increasing any discomfort in your ankle. Repeat __________ times. Complete this exercise __________ times a day. *If instructed by your physician, physical therapist or athletic trainer, you may add ____________________ of resistance by  placing a weighted object on your right / left knee. Document Released: 12/28/2004 Document Revised: 08/21/2011 Document Reviewed: 09/10/2008 [facility] Patient Information 2015 [place], [place]. This information is not intended to replace advice given to you by your health care provider. Make sure you discuss any questions you have with your health care provider.

## 2013-11-27 NOTE — Progress Notes (Signed)
Subjective: Patient presents today for followup of MRI study performed on the left foot and ankle. She states that she has been wearing the ankle brace I recommended as well as utilizing the cream I ordered her through the compounding pharmacy. She states that overall she feels improvement on the left foot and ankle.  Objective: Neurovascular status intact and unchanged. Mild pain on palpation at the sinus tarsi region of the left foot. No pain along the sesamoid apparatus left. MRI showed some arthritic changes at the ankle joint along the sinus tarsi region. Contusion or irritation of the sesamoids also reported. Otherwise MRI was reported to be normal.  Assessment: Sinus tarsi syndrome left foot  Plan: Discussed continued treatments with the cream, ankle brace, good supportive shoes, and stretching and strengthening exercises. We discussed formal physical therapy in the future if the foot becomes painful in the future. We also discussed the possibility of an injection into the sinus tarsi if necessary. At this time the patient would like to hold off on injections as she is already getting injections for neuropathy treatments along with tens unit.  Overall she just wanted to make sure her foot pain was due to the neuropathy and not something else. This was confirmed for her via MRI she will be seen back on as-needed basis.

## 2013-12-02 ENCOUNTER — Encounter: Payer: Medicare Other | Admitting: Internal Medicine

## 2013-12-31 ENCOUNTER — Encounter: Payer: Medicare Other | Admitting: Internal Medicine

## 2014-02-03 ENCOUNTER — Ambulatory Visit (INDEPENDENT_AMBULATORY_CARE_PROVIDER_SITE_OTHER): Payer: Medicare Other | Admitting: Internal Medicine

## 2014-02-03 ENCOUNTER — Encounter: Payer: Self-pay | Admitting: Internal Medicine

## 2014-02-03 VITALS — BP 104/68 | HR 70 | Temp 97.7°F | Resp 14 | Ht 65.75 in | Wt 130.5 lb

## 2014-02-03 DIAGNOSIS — Z0000 Encounter for general adult medical examination without abnormal findings: Secondary | ICD-10-CM

## 2014-02-03 DIAGNOSIS — Z79899 Other long term (current) drug therapy: Secondary | ICD-10-CM

## 2014-02-03 DIAGNOSIS — R109 Unspecified abdominal pain: Secondary | ICD-10-CM

## 2014-02-03 LAB — MICROALBUMIN / CREATININE URINE RATIO
Creatinine,U: 176.5 mg/dL
MICROALB/CREAT RATIO: 0.8 mg/g (ref 0.0–30.0)
Microalb, Ur: 1.5 mg/dL (ref 0.0–1.9)

## 2014-02-03 LAB — LIPID PANEL
CHOL/HDL RATIO: 3
Cholesterol: 179 mg/dL (ref 0–200)
HDL: 66.1 mg/dL (ref >39.00)
LDL Cholesterol: 92 mg/dL (ref 0–99)
NonHDL: 112.9
TRIGLYCERIDES: 105 mg/dL (ref 0.0–149.0)
VLDL: 21 mg/dL (ref 0.0–40.0)

## 2014-02-03 LAB — CBC WITH DIFFERENTIAL/PLATELET
Basophils Absolute: 0 10*3/uL (ref 0.0–0.1)
Basophils Relative: 0.7 % (ref 0.0–3.0)
Eosinophils Absolute: 0.2 10*3/uL (ref 0.0–0.7)
Eosinophils Relative: 4.5 % (ref 0.0–5.0)
HEMATOCRIT: 42.5 % (ref 36.0–46.0)
HEMOGLOBIN: 14.6 g/dL (ref 12.0–15.0)
LYMPHS ABS: 1.6 10*3/uL (ref 0.7–4.0)
Lymphocytes Relative: 30.6 % (ref 12.0–46.0)
MCHC: 34.3 g/dL (ref 30.0–36.0)
MCV: 94.4 fl (ref 78.0–100.0)
MONOS PCT: 5.3 % (ref 3.0–12.0)
Monocytes Absolute: 0.3 10*3/uL (ref 0.1–1.0)
NEUTROS ABS: 3.1 10*3/uL (ref 1.4–7.7)
Neutrophils Relative %: 58.9 % (ref 43.0–77.0)
Platelets: 159 10*3/uL (ref 150.0–400.0)
RBC: 4.5 Mil/uL (ref 3.87–5.11)
RDW: 12.9 % (ref 11.5–15.5)
WBC: 5.3 10*3/uL (ref 4.0–10.5)

## 2014-02-03 LAB — COMPREHENSIVE METABOLIC PANEL
ALT: 21 U/L (ref 0–35)
AST: 33 U/L (ref 0–37)
Albumin: 3.9 g/dL (ref 3.5–5.2)
Alkaline Phosphatase: 53 U/L (ref 39–117)
BILIRUBIN TOTAL: 1 mg/dL (ref 0.2–1.2)
BUN: 17 mg/dL (ref 6–23)
CO2: 26 meq/L (ref 19–32)
Calcium: 10.1 mg/dL (ref 8.4–10.5)
Chloride: 105 mEq/L (ref 96–112)
Creatinine, Ser: 0.9 mg/dL (ref 0.4–1.2)
GFR: 66.35 mL/min (ref >60.00)
GLUCOSE: 81 mg/dL (ref 70–99)
Potassium: 5 mEq/L (ref 3.5–5.1)
Sodium: 138 mEq/L (ref 135–145)
Total Protein: 7.4 g/dL (ref 6.0–8.3)

## 2014-02-03 MED ORDER — HYOSCYAMINE SULFATE 0.125 MG SL SUBL: 0.1250 mg | SUBLINGUAL_TABLET | SUBLINGUAL | Status: DC | PRN

## 2014-02-03 MED ORDER — ZOLPIDEM TARTRATE 5 MG PO TABS: ORAL_TABLET | ORAL | Status: DC

## 2014-02-03 MED ORDER — TOPIRAMATE 25 MG PO TABS: 25.0000 mg | ORAL_TABLET | Freq: Every day | ORAL | Status: DC

## 2014-02-03 NOTE — Progress Notes (Signed)
Subjective:    Patient ID: Amanda Robbins, female    DOB: Oct 28, 1940, 73 y.o.   MRN: [id number]  HPI The patient is here for annual Medicare wellness examination and management of other chronic and acute problems.   The risk factors are reflected in the social history.  The roster of all physicians providing medical care to patient - is listed in the Snapshot section of the chart.  Activities of daily living:  The patient is 100% independent in all ADLs: dressing, toileting, feeding as well as independent mobility  Home safety : The patient has smoke detectors in the home. They wear seatbelts.  There are no firearms at home. There is no violence in the home.   There is no risks for hepatitis, STDs or HIV. There is no history of blood transfusion. They have no travel history to infectious disease endemic areas of the world.  The patient has seen their dentist in the last six month. Dr. Carman Ching They have seen their eye doctor in the last year. Dr. Tobe Sos No issues with hearing. They have deferred audiologic testing in the last year.   They do not  have excessive sun exposure. Discussed the need for sun protection: hats, long sleeves and use of sunscreen if there is significant sun exposure. Dr. Nehemiah Massed  Diet: the importance of a healthy diet is discussed. They do have a healthy diet.  The benefits of regular aerobic exercise were discussed. She walks. Limited by neuropathy.  Depression screen: there are no signs or vegative symptoms of depression- irritability, change in appetite, anhedonia, sadness/tearfullness.  Cognitive assessment: the patient manages all their financial and personal affairs and is actively engaged. They could relate day,date,year and events.  The following portions of the patient's history were reviewed and updated as appropriate: allergies, current medications, past family history, past medical history,  past surgical history, past social history  and problem  list.  Visual acuity was not assessed per patient preference since she has regular follow up with her ophthalmologist. Hearing and body mass index were assessed and reviewed.   During the course of the visit the patient was educated and counseled about appropriate screening and preventive services including : fall prevention , diabetes screening, nutrition counseling, colorectal cancer screening, and recommended immunizations.    Recently had EMG testing and novocaine shots injected into feet to help with neuropathy. Also used TENS unit. No improvement with this. Continues to see Chiropractor for this, and has Accupuncture.  Having some loose stools and anxiety, when traveling or participating in new activities. Stomach cramping with this and increased gas. No blood in stool. Symptoms do not occur when at home.  Review of Systems  Constitutional: Negative for fever, chills, appetite change, fatigue and unexpected weight change.  HENT: Negative for congestion.   Eyes: Negative for visual disturbance.  Respiratory: Negative for shortness of breath.   Cardiovascular: Negative for chest pain and leg swelling.  Gastrointestinal: Positive for abdominal pain, diarrhea and abdominal distention (intermittent). Negative for nausea, vomiting, constipation and blood in stool.  Musculoskeletal: Positive for myalgias. Negative for arthralgias.  Skin: Negative for color change and rash.  Neurological: Positive for dizziness (with car travel). Negative for tremors and weakness.  Hematological: Negative for adenopathy. Does not bruise/bleed easily.  Psychiatric/Behavioral: Negative for dysphoric mood. The patient is not nervous/anxious.        Objective:    BP 104/68  Pulse 70  Temp(Src) 97.7 F (36.5 C) (Oral)  Resp 14  Ht 5' 5.75" (1.67 m)  Wt 130 lb 8 oz (59.194 kg)  BMI 21.22 kg/m2  SpO2 97% Physical Exam  Constitutional: She is oriented to person, place, and time. She appears well-developed  and well-nourished. No distress.  HENT:  Head: Normocephalic and atraumatic.  Right Ear: External ear normal.  Left Ear: External ear normal.  Nose: Nose normal.  Mouth/Throat: Oropharynx is clear and moist. No oropharyngeal exudate.  Eyes: Conjunctivae are normal. Pupils are equal, round, and reactive to light. Right eye exhibits no discharge. Left eye exhibits no discharge. No scleral icterus.  Neck: Normal range of motion. Neck supple. No tracheal deviation present. No thyromegaly present.  Cardiovascular: Normal rate, regular rhythm, normal heart sounds and intact distal pulses.  Exam reveals no gallop and no friction rub.   No murmur heard. Pulmonary/Chest: Effort normal and breath sounds normal. No accessory muscle usage. Not tachypneic. No respiratory distress. She has no decreased breath sounds. She has no wheezes. She has no rhonchi. She has no rales. She exhibits no tenderness. Right breast exhibits no inverted nipple, no mass, no nipple discharge, no skin change and no tenderness. Left breast exhibits no inverted nipple, no mass, no nipple discharge, no skin change and no tenderness. Breasts are symmetrical.  Abdominal: Soft. Bowel sounds are normal. She exhibits no distension and no mass. There is no tenderness. There is no rebound and no guarding.  Musculoskeletal: Normal range of motion. She exhibits no edema and no tenderness.  Lymphadenopathy:    She has no cervical adenopathy.  Neurological: She is alert and oriented to person, place, and time. No cranial nerve deficit. She exhibits normal muscle tone. Coordination normal.  Skin: Skin is warm and dry. No rash noted. She is not diaphoretic. No erythema. No pallor.  Psychiatric: She has a normal mood and affect. Her behavior is normal. Judgment and thought content normal.          Assessment & Plan:   Problem List Items Addressed This Visit     Unprioritized   Abdominal cramping     Chronic abdominal cramping and  diarrhea in certain situations. Will try adding prn Levsin to see if any improvement. We also discussed adding low dose of daily Paxil if symptoms are persistent. Follow up prn.    Relevant Medications      hyoscyamine (LEVSIN SL) 0.125 MG SL tablet   Medicare annual wellness visit, subsequent - Primary     General medical exam including breast exam normal today. Pap and pelvic deferred per patient preference based on history of previous normal Pap, last Pap in 2011 showed atrophy. Mammogram ordered. Colonoscopy is up to date. Immunizations are up-to-date except for Prevnar which was given today. Will check labs today including CBC, CMP, lipid profile. Appropriate screening performed.      Relevant Orders      CBC with Differential      Comprehensive metabolic panel      Lipid panel      Microalbumin / creatinine urine ratio      MM Digital Screening       Return in about 6 months (around 08/06/2014) for Recheck.

## 2014-02-03 NOTE — Progress Notes (Signed)
Pre visit review using our clinic review tool, if applicable. No additional management support is needed unless otherwise documented below in the visit note. 

## 2014-02-03 NOTE — Assessment & Plan Note (Signed)
General medical exam including breast exam normal today. Pap and pelvic deferred per patient preference based on history of previous normal Pap, last Pap in 2011 showed atrophy. Mammogram ordered. Colonoscopy is up to date. Immunizations are up-to-date except for Prevnar which was given today. Will check labs today including CBC, CMP, lipid profile. Appropriate screening performed.

## 2014-02-03 NOTE — Patient Instructions (Signed)

## 2014-02-03 NOTE — Assessment & Plan Note (Signed)
Chronic abdominal cramping and diarrhea in certain situations. Will try adding prn Levsin to see if any improvement. We also discussed adding low dose of daily Paxil if symptoms are persistent. Follow up prn.

## 2014-03-23 ENCOUNTER — Encounter: Payer: Self-pay | Admitting: Internal Medicine

## 2014-05-05 ENCOUNTER — Telehealth: Payer: Self-pay | Admitting: Internal Medicine

## 2014-05-05 NOTE — Telephone Encounter (Signed)
Please advise 

## 2014-05-05 NOTE — Telephone Encounter (Signed)
Spoke with pt, advised of MDs message.  States she will go to [facility] now.

## 2014-05-05 NOTE — Telephone Encounter (Signed)
I would recommend for her to be seen at an orthopedic urgent clinic today, or in the ED, as she may have blood clot in her leg or bony injury, given the description of the swelling.

## 2014-05-05 NOTE — Telephone Encounter (Signed)
Patient Information:  Caller Name: Prima  Phone: [phone]  Patient: Amanda Robbins  Gender: Female  DOB: [birth year]  Age: 73 Years  PCP: Ronette Deter (Adults only)  Office Follow Up:  Does the office need to follow up with this patient?: Yes  Instructions For The Office: Patient declines appt today or tomorrow at another location. She wants to be seen at this office. Please contact for appt time.  Care advice provided. Understanding expressed.  RN Note:  Patient declines appt today or tomorrow at another location. She wants to be seen at this office. Please contact for appt time.  Care advice provided. Understanding expressed.  Symptoms  Reason For Call & Symptoms: Patient complains of Right ankle up to mid leg swelling with tenderness to touch. Onset 1 1/2 weeks ago.  Denies any recent injury, no heat or warmth, no red streaks or sores. . No history of swelling.  No shortness of breath or chest pain. It does reduce at night but never goes away. Leave "thumb inprint and tight".  Tenderness at Right groin but no swelling.  Reviewed Health History In EMR: Yes  Reviewed Medications In EMR: Yes  Reviewed Allergies In EMR: Yes  Reviewed Surgeries / Procedures: Yes  Date of Onset of Symptoms: 04/25/2014  Treatments Tried: Elevation  Treatments Tried Worked: No  Guideline(s) Used:  Leg Swelling and Edema  Disposition Per Guideline:   See Today in Office  Reason For Disposition Reached:   Patient wants to be seen  Advice Given:  Call Back If:  Swelling becomes worse  Swelling becomes red or painful to the touch  Calf pain occurs and becomes constant  You become worse.  RN Overrode Recommendation:  Make Appointment  Patient declines appt today or tomorrow at another location. She wants to be seen at this office. Please contact for appt time.  Care advice provided. Understanding expressed.

## 2014-05-06 ENCOUNTER — Ambulatory Visit: Payer: Self-pay | Admitting: Family Medicine

## 2014-05-12 ENCOUNTER — Ambulatory Visit: Payer: Self-pay | Admitting: Oncology

## 2014-05-25 ENCOUNTER — Inpatient Hospital Stay: Payer: Self-pay | Admitting: Internal Medicine

## 2014-05-25 LAB — BASIC METABOLIC PANEL
Anion Gap: 5 — ABNORMAL LOW (ref 7–16)
BUN: 13 mg/dL (ref 7–18)
CO2: 23 mmol/L (ref 21–32)
Calcium, Total: 9 mg/dL (ref 8.5–10.1)
Chloride: 110 mmol/L — ABNORMAL HIGH (ref 98–107)
Creatinine: 0.8 mg/dL (ref 0.60–1.30)
Glucose: 125 mg/dL — ABNORMAL HIGH (ref 65–99)
Osmolality: 277 (ref 275–301)
Potassium: 4.1 mmol/L (ref 3.5–5.1)
Sodium: 138 mmol/L (ref 136–145)

## 2014-05-25 LAB — CBC WITH DIFFERENTIAL/PLATELET
BASOS ABS: 0 10*3/uL (ref 0.0–0.1)
BASOS PCT: 0.3 %
Eosinophil #: 0.1 10*3/uL (ref 0.0–0.7)
Eosinophil %: 2.1 %
HCT: 43.1 % (ref 35.0–47.0)
HGB: 14.3 g/dL (ref 12.0–16.0)
Lymphocyte #: 0.9 10*3/uL — ABNORMAL LOW (ref 1.0–3.6)
Lymphocyte %: 13.5 %
MCH: 31.8 pg (ref 26.0–34.0)
MCHC: 33.3 g/dL (ref 32.0–36.0)
MCV: 96 fL (ref 80–100)
MONO ABS: 0.2 x10 3/mm (ref 0.2–0.9)
Monocyte %: 3.5 %
Neutrophil #: 5.2 10*3/uL (ref 1.4–6.5)
Neutrophil %: 80.6 %
Platelet: 122 10*3/uL — ABNORMAL LOW (ref 150–440)
RBC: 4.51 10*6/uL (ref 3.80–5.20)
RDW: 12.6 % (ref 11.5–14.5)
WBC: 6.4 10*3/uL (ref 3.6–11.0)

## 2014-05-26 ENCOUNTER — Telehealth: Payer: Self-pay | Admitting: Internal Medicine

## 2014-05-26 ENCOUNTER — Ambulatory Visit: Payer: Self-pay | Admitting: Oncology

## 2014-05-26 LAB — CBC WITH DIFFERENTIAL/PLATELET
BASOS PCT: 0 %
Basophil #: 0 10*3/uL (ref 0.0–0.1)
EOS ABS: 0 10*3/uL (ref 0.0–0.7)
Eosinophil %: 0 %
HCT: 41.8 % (ref 35.0–47.0)
HGB: 14 g/dL (ref 12.0–16.0)
Lymphocyte #: 0.7 10*3/uL — ABNORMAL LOW (ref 1.0–3.6)
Lymphocyte %: 12.7 %
MCH: 31.9 pg (ref 26.0–34.0)
MCHC: 33.5 g/dL (ref 32.0–36.0)
MCV: 95 fL (ref 80–100)
MONO ABS: 0 x10 3/mm — AB (ref 0.2–0.9)
Monocyte %: 0.7 %
NEUTROS PCT: 86.6 %
Neutrophil #: 4.6 10*3/uL (ref 1.4–6.5)
Platelet: 146 10*3/uL — ABNORMAL LOW (ref 150–440)
RBC: 4.4 10*6/uL (ref 3.80–5.20)
RDW: 12.2 % (ref 11.5–14.5)
WBC: 5.3 10*3/uL (ref 3.6–11.0)

## 2014-05-26 LAB — BASIC METABOLIC PANEL
Anion Gap: 6 — ABNORMAL LOW (ref 7–16)
BUN: 12 mg/dL (ref 7–18)
CHLORIDE: 110 mmol/L — AB (ref 98–107)
Calcium, Total: 8.7 mg/dL (ref 8.5–10.1)
Co2: 24 mmol/L (ref 21–32)
Creatinine: 0.83 mg/dL (ref 0.60–1.30)
EGFR (African American): >60
Glucose: 135 mg/dL — ABNORMAL HIGH (ref 65–99)
OSMOLALITY: 281 (ref 275–301)
Potassium: 4.4 mmol/L (ref 3.5–5.1)
Sodium: 140 mmol/L (ref 136–145)

## 2014-05-26 LAB — CLOSTRIDIUM DIFFICILE(ARMC)

## 2014-05-26 NOTE — Telephone Encounter (Signed)
Amy, the pt's daughter called saying Ms. Whittinghill has been admitted into the hospital. Lesions have been found on her brain per Amy, and they're waiting for results from the CT scan she had. Amy wanted to make Dr. Gilford Rile aware of this. Thank you.

## 2014-05-26 NOTE — Telephone Encounter (Signed)
Yes, this morning I received a note from the admitting doctor, saying she had been admitted for headache and nausea and found to have lesions in her brain. I will continue to follow with Dr. Earleen Newport, the hospitalist.

## 2014-05-27 ENCOUNTER — Ambulatory Visit: Payer: Medicare Other | Admitting: Nurse Practitioner

## 2014-05-27 ENCOUNTER — Telehealth: Payer: Self-pay | Admitting: Internal Medicine

## 2014-05-27 LAB — CANCER ANTIGEN 27.29: CA 27.29: 29.4 U/mL (ref 0.0–38.6)

## 2014-05-27 LAB — CA 125: CA 125: 38.2 U/mL — ABNORMAL HIGH (ref 0.0–34.0)

## 2014-05-27 LAB — CANCER ANTIGEN 19-9: CA 19 9: 26 U/mL (ref 0–35)

## 2014-05-27 LAB — CEA: CEA: 1.6 ng/mL (ref 0.0–4.7)

## 2014-05-27 NOTE — Telephone Encounter (Signed)
The patient will be discharged today she has a hospital follow up appointment scheduled on 12.21.15 @ 2:30.

## 2014-05-28 NOTE — Telephone Encounter (Signed)
Discharge summary & records received & placed on Julie's desk

## 2014-05-29 ENCOUNTER — Telehealth: Payer: Self-pay | Admitting: *Deleted

## 2014-05-29 NOTE — Telephone Encounter (Signed)
Discharge Date: 05/27/14   Transition Care Management Follow-up Telephone Call  How have you been since you were released from the hospital? Pt states "today has been my best day, i got out of the house.  Pain is being managed well with medication they have me on now"    Do you understand why you were in the hospital? YES   Do you understand the discharge instrcutions? YES  Items Reviewed:  Medications reviewed: YES  Allergies reviewed:NO  Dietary changes reviewed: NO  Referrals reviewed: n/a   Functional Questionnaire:   Activities of Daily Living (ADLs):   She states they are independent in the following:  States they require assistance with the following:    Any transportation issues/concerns?: /NO   Any patient concerns?NO   Confirmed importance and date/time of follow-up visits scheduled: YES/ dec 21st @ 2:30   Confirmed with patient if condition begins to worsen call PCP or go to the ER.  Patient was given the Call-a-Nurse line [phone]: YES

## 2014-06-01 ENCOUNTER — Ambulatory Visit (INDEPENDENT_AMBULATORY_CARE_PROVIDER_SITE_OTHER): Payer: Medicare Other | Admitting: Internal Medicine

## 2014-06-01 ENCOUNTER — Encounter: Payer: Self-pay | Admitting: Internal Medicine

## 2014-06-01 VITALS — BP 125/76 | HR 83 | Temp 97.7°F | Ht 65.75 in | Wt 137.0 lb

## 2014-06-01 DIAGNOSIS — C7931 Secondary malignant neoplasm of brain: Secondary | ICD-10-CM | POA: Insufficient documentation

## 2014-06-01 NOTE — Progress Notes (Signed)
Pre visit review using our clinic review tool, if applicable. No additional management support is needed unless otherwise documented below in the visit note. 

## 2014-06-01 NOTE — Patient Instructions (Signed)
We will get notes from the [facility].  Please call if you need anything.

## 2014-06-01 NOTE — Progress Notes (Signed)
Subjective:    Patient ID: VALLEY KE, female    DOB: [birth year], 73 y.o.   MRN: [id number]  HPI 73YO female presents for hospital follow up.  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 Had a 4 day headache, that was severe with nausea. Went to [facility] ED and CT showed brain lesions. Also notes swelling in legs with pain in right groin. US showed no blood clot. PET scan showed concerning area on right ovary. Scheduled to see GYN-ONC from [facility] Jan 13th for biopsy of right ovary. Starts whole brain radiation tomorrow.  Taking Morphine at night for pain. Also on Decadron twice daily, with plan to taper after radiation therapy. Taking Keppra for seizure prophylaxis. Taking Topamax mid-day. No current headache pain. No seizure activity. No recent nausea.brain met Family very supportive at home.  Past medical, surgical, family and social history per today's encounter.  Review of Systems  Constitutional: Positive for fatigue. Negative for fever, chills, appetite change and unexpected weight change.  Eyes: Negative for visual disturbance.  Respiratory: Negative for shortness of breath.   Cardiovascular: Positive for leg swelling (right > left lower leg). Negative for chest pain.  Gastrointestinal: Positive for abdominal pain (occasional mild RLQ, none presently). Negative for nausea, vomiting, diarrhea and constipation.  Musculoskeletal: Negative for myalgias and arthralgias.  Skin: Negative for color change and rash.  Neurological: Positive for weakness and headaches. Negative for dizziness, seizures, syncope and speech difficulty.  Hematological: Negative for adenopathy. Does not bruise/bleed easily.  Psychiatric/Behavioral: Negative for dysphoric mood. The patient is not nervous/anxious.        Objective:    BP 125/76 mmHg  Pulse 83  Temp(Src) 97.7 F (36.5 C) (Oral)  Ht 5'  5.75" (1.67 m)  Wt 137 lb (62.143 kg)  BMI 22.28 kg/m2  SpO2 99% Physical Exam  Constitutional: She is oriented to person, place, and time. She appears well-developed and well-nourished. No distress.  HENT:  Head: Normocephalic and atraumatic.  Right Ear: External ear normal.  Left Ear: External ear normal.  Nose: Nose normal.  Mouth/Throat: Oropharynx is clear and moist. No oropharyngeal exudate.  Eyes: Conjunctivae are normal. Pupils are equal, round, and reactive to light. Right eye exhibits no discharge. Left eye exhibits no discharge. No scleral icterus.  Neck: Normal range of motion. Neck supple. No tracheal deviation present. No thyromegaly present.  Cardiovascular: Normal rate, regular rhythm, normal heart sounds and intact distal pulses.  Exam reveals no gallop and no friction rub.   No murmur heard. Pulmonary/Chest: Effort normal and breath sounds normal. No accessory muscle usage. No tachypnea. No respiratory distress. She has no decreased breath sounds. She has no wheezes. She has no rhonchi. She has no rales. She exhibits no tenderness.  Abdominal: Soft. Bowel sounds are normal. She exhibits no distension and no mass. There is tenderness (mild RLQ). There is no rebound and no guarding.  Musculoskeletal: Normal range of motion. She exhibits edema (trace pitting RLE to mid shin). She exhibits no tenderness.  Lymphadenopathy:    She has no cervical adenopathy.  Neurological: She is alert and oriented to person, place, and time. No cranial nerve deficit. She exhibits normal muscle tone. Coordination normal.  Skin: Skin is warm and dry. No rash noted. She is not diaphoretic. No erythema. No pallor.  Psychiatric: She has a normal mood and affect. Her behavior is normal. Judgment and thought content normal.  Assessment & Plan:   Problem List Items Addressed This Visit      Unprioritized   Brain metastasis - Primary    Multiple brain mets noted on CT and MRI from  12/14 with surrounding edema. On Decadron and Keppra. Morphine for pain control. Total brain XRT starting tomorrow. Further workup of primary tumor with biopsy of right ovarian mass pending for January. Will continue to follow.    Relevant Medications      Morphine Sulfate (MORPHINE CONCENTRATE) 10 mg / 0.5 ml concentrated solution      levETIRAcetam (KEPPRA) 500 MG tablet      dexamethasone (DECADRON) 4 MG tablet      Acetaminophen 500 MG coapsule       Return in about 3 months (around 08/31/2014) for Recheck.

## 2014-06-01 NOTE — Assessment & Plan Note (Signed)
Multiple brain mets noted on CT and MRI from 12/14 with surrounding edema. On Decadron and Keppra. Morphine for pain control. Total brain XRT starting tomorrow. Further workup of primary tumor with biopsy of right ovarian mass pending for January. Will continue to follow.

## 2014-06-12 ENCOUNTER — Ambulatory Visit: Payer: Self-pay | Admitting: Oncology

## 2014-06-25 ENCOUNTER — Encounter: Payer: Self-pay | Admitting: Internal Medicine

## 2014-06-25 ENCOUNTER — Other Ambulatory Visit: Payer: Self-pay | Admitting: *Deleted

## 2014-06-25 DIAGNOSIS — C7931 Secondary malignant neoplasm of brain: Secondary | ICD-10-CM

## 2014-06-25 MED ORDER — FUROSEMIDE 20 MG PO TABS: 20.0000 mg | ORAL_TABLET | Freq: Every day | ORAL | Status: DC

## 2014-06-29 ENCOUNTER — Ambulatory Visit: Payer: Self-pay

## 2014-06-29 LAB — CBC WITH DIFFERENTIAL/PLATELET
BASOS ABS: 0 10*3/uL (ref 0.0–0.1)
Basophil %: 0.2 %
EOS ABS: 0 10*3/uL (ref 0.0–0.7)
Eosinophil %: 0.4 %
HCT: 45.9 % (ref 35.0–47.0)
HGB: 15.5 g/dL (ref 12.0–16.0)
LYMPHS PCT: 7 %
Lymphocyte #: 0.6 10*3/uL — ABNORMAL LOW (ref 1.0–3.6)
MCH: 32.6 pg (ref 26.0–34.0)
MCHC: 33.8 g/dL (ref 32.0–36.0)
MCV: 96 fL (ref 80–100)
Monocyte #: 0.3 x10 3/mm (ref 0.2–0.9)
Monocyte %: 4.3 %
NEUTROS ABS: 6.9 10*3/uL — AB (ref 1.4–6.5)
NEUTROS PCT: 88.1 %
Platelet: 104 10*3/uL — ABNORMAL LOW (ref 150–440)
RBC: 4.76 10*6/uL (ref 3.80–5.20)
RDW: 14.9 % — ABNORMAL HIGH (ref 11.5–14.5)
WBC: 7.9 10*3/uL (ref 3.6–11.0)

## 2014-06-29 LAB — URINALYSIS, COMPLETE
BACTERIA: NONE SEEN
Bilirubin,UR: NEGATIVE
Blood: NEGATIVE
Glucose,UR: NEGATIVE mg/dL (ref 0–75)
KETONE: NEGATIVE
Leukocyte Esterase: NEGATIVE
Nitrite: NEGATIVE
Ph: 5 (ref 4.5–8.0)
Protein: NEGATIVE
RBC, UR: NONE SEEN /HPF (ref 0–5)
SQUAMOUS EPITHELIAL: NONE SEEN
Specific Gravity: 1.017 (ref 1.003–1.030)

## 2014-06-29 LAB — BASIC METABOLIC PANEL
ANION GAP: 10 (ref 7–16)
BUN: 37 mg/dL — AB (ref 7–18)
CHLORIDE: 108 mmol/L — AB (ref 98–107)
CREATININE: 0.88 mg/dL (ref 0.60–1.30)
Calcium, Total: 8.5 mg/dL (ref 8.5–10.1)
Co2: 22 mmol/L (ref 21–32)
EGFR (African American): >60
EGFR (Non-African Amer.): >60
Glucose: 93 mg/dL (ref 65–99)
Osmolality: 288 (ref 275–301)
Potassium: 4.2 mmol/L (ref 3.5–5.1)
Sodium: 140 mmol/L (ref 136–145)

## 2014-06-29 LAB — APTT: Activated PTT: <23 secs — ABNORMAL LOW (ref 23.6–35.9)

## 2014-06-29 LAB — PROTIME-INR
INR: 1
PROTHROMBIN TIME: 12.7 s (ref 11.5–14.7)

## 2014-06-30 ENCOUNTER — Encounter: Payer: Self-pay | Admitting: Internal Medicine

## 2014-07-01 ENCOUNTER — Encounter: Payer: Self-pay | Admitting: Internal Medicine

## 2014-07-08 ENCOUNTER — Ambulatory Visit: Payer: Self-pay | Admitting: Obstetrics and Gynecology

## 2014-07-08 LAB — PLATELET COUNT: PLATELETS: 107 10*3/uL — AB (ref 150–440)

## 2014-07-13 ENCOUNTER — Ambulatory Visit: Payer: Self-pay | Admitting: Oncology

## 2014-07-21 ENCOUNTER — Emergency Department: Payer: Self-pay | Admitting: Emergency Medicine

## 2014-07-30 ENCOUNTER — Ambulatory Visit: Payer: Self-pay | Admitting: Vascular Surgery

## 2014-08-06 ENCOUNTER — Ambulatory Visit: Payer: Medicare Other | Admitting: Internal Medicine

## 2014-08-11 ENCOUNTER — Ambulatory Visit
Admit: 2014-08-11 | Disposition: A | Discharge status: Home / Self Care | Payer: Self-pay | Attending: Oncology | Admitting: Oncology

## 2014-08-19 ENCOUNTER — Other Ambulatory Visit: Payer: Self-pay | Admitting: Internal Medicine

## 2014-08-31 ENCOUNTER — Ambulatory Visit (INDEPENDENT_AMBULATORY_CARE_PROVIDER_SITE_OTHER): Payer: Medicare Other | Admitting: Internal Medicine

## 2014-08-31 ENCOUNTER — Encounter: Payer: Self-pay | Admitting: Internal Medicine

## 2014-08-31 VITALS — BP 95/65 | HR 88 | Temp 97.7°F | Ht 65.75 in | Wt 138.2 lb

## 2014-08-31 DIAGNOSIS — D696 Thrombocytopenia, unspecified: Secondary | ICD-10-CM

## 2014-08-31 DIAGNOSIS — I2699 Other pulmonary embolism without acute cor pulmonale: Secondary | ICD-10-CM

## 2014-08-31 DIAGNOSIS — C569 Malignant neoplasm of unspecified ovary: Secondary | ICD-10-CM

## 2014-08-31 NOTE — Patient Instructions (Signed)
Follow up in 3 months

## 2014-08-31 NOTE — Progress Notes (Signed)
Subjective:    Patient ID: Amanda Robbins, female    DOB: 05/24/1941, 74 y.o.   MRN: [id number]  HPI  74YO female presents for follow up.  Last seen 05/2014 after diagnosis of metastatic cancer to brain.  Had surgery to biopsy abdominal mass, diagnosed with ovarian cancer.  Had chemotherapy 4 weeks ago. Had allergic reaction to a Taxol. Went back last Tuesday for chemo, but plt 63, so held. Plans to recheck tomorrow. Plan for 6 cycles of chemotherapy.  Increased Furosemide to full 20mg  tablet daily. Some improvement with right leg swelling with this. Started on potassium to supplement.  Wt Readings from Last 3 Encounters:  08/31/14 138 lb 4 oz (62.71 kg)  06/01/14 137 lb (62.143 kg)  02/03/14 130 lb 8 oz (59.194 kg)   Started on Eliquis after PE. Tolerating well. Per oncology, plan for 6 month course.  No recent nausea or diarrhea. No recent constipation. Headache is gone. No longer using Morphine.   Past medical, surgical, family and social history per today's encounter.  Review of Systems  Constitutional: Positive for fatigue. Negative for fever, chills, appetite change and unexpected weight change.  Eyes: Negative for visual disturbance.  Respiratory: Negative for shortness of breath.   Cardiovascular: Positive for leg swelling. Negative for chest pain and palpitations.  Gastrointestinal: Negative for nausea, vomiting, abdominal pain, diarrhea and constipation.  Musculoskeletal: Negative for myalgias and arthralgias.  Skin: Negative for color change and rash.  Neurological: Negative for headaches.  Hematological: Negative for adenopathy. Does not bruise/bleed easily.  Psychiatric/Behavioral: Negative for dysphoric mood. The patient is not nervous/anxious.        Objective:    BP 95/65 mmHg  Pulse 88  Temp(Src) 97.7 F (36.5 C) (Oral)  Ht 5' 5.75" (1.67 m)  Wt 138 lb 4 oz (62.71 kg)  BMI 22.49 kg/m2  SpO2 99% Physical Exam  Constitutional: She is oriented to  person, place, and time. She appears well-developed and well-nourished. No distress.  HENT:  Head: Normocephalic and atraumatic.  Right Ear: External ear normal.  Left Ear: External ear normal.  Nose: Nose normal.  Mouth/Throat: Oropharynx is clear and moist. No oropharyngeal exudate.  Eyes: Conjunctivae are normal. Pupils are equal, round, and reactive to light. Right eye exhibits no discharge. Left eye exhibits no discharge. No scleral icterus.  Neck: Normal range of motion. Neck supple. No tracheal deviation present. No thyromegaly present.  Cardiovascular: Normal rate, regular rhythm, normal heart sounds and intact distal pulses.  Exam reveals no gallop and no friction rub.   No murmur heard. Pulmonary/Chest: Effort normal and breath sounds normal. No respiratory distress. She has no wheezes. She has no rales. She exhibits no tenderness.  Musculoskeletal: Normal range of motion. She exhibits edema (pitting to upper thigh right leg). She exhibits no tenderness.  Lymphadenopathy:    She has no cervical adenopathy.  Neurological: She is alert and oriented to person, place, and time. No cranial nerve deficit. She exhibits normal muscle tone. Coordination normal.  Skin: Skin is warm and dry. No rash noted. She is not diaphoretic. No erythema. No pallor.  Psychiatric: She has a normal mood and affect. Her behavior is normal. Judgment and thought content normal.          Assessment & Plan:   Problem List Items Addressed This Visit      Unprioritized   Embolism, pulmonary with infarction    On Eliquis. Tolerating well.      Relevant Medications  ELIQUIS 5 MG TABS tablet   Ovarian cancer - Primary    Presented with metastatic disease to brain. Started chemotherapy, and waiting to have second cycle, which has been delayed by thrombocytopenia. Will request recent notes from oncology.      Thrombocytopenia    Recent PLT 63 after chemo. Discussed risks of bleeding with low platelets.  Offered reassurance that bleeding risk relatively low with this count, however confounded by use of Xarelto. Will follow.          Return in about 3 months (around 12/01/2014) for Recheck.

## 2014-08-31 NOTE — Assessment & Plan Note (Signed)
On Eliquis. Tolerating well.

## 2014-08-31 NOTE — Assessment & Plan Note (Signed)
Recent PLT 63 after chemo. Discussed risks of bleeding with low platelets. Offered reassurance that bleeding risk relatively low with this count, however confounded by use of Xarelto. Will follow.

## 2014-08-31 NOTE — Progress Notes (Signed)
Pre visit review using our clinic review tool, if applicable. No additional management support is needed unless otherwise documented below in the visit note. 

## 2014-08-31 NOTE — Assessment & Plan Note (Signed)
Presented with metastatic disease to brain. Started chemotherapy, and waiting to have second cycle, which has been delayed by thrombocytopenia. Will request recent notes from oncology.

## 2014-09-07 LAB — CA 125: CA 125: 51.9 U/mL — AB (ref 0.0–34.0)

## 2014-09-08 LAB — CBC CANCER CENTER
Basophil #: 0 x10 3/mm (ref 0.0–0.1)
Basophil %: 0.5 %
EOS PCT: 0.6 %
Eosinophil #: 0 x10 3/mm (ref 0.0–0.7)
HCT: 35.1 % (ref 35.0–47.0)
HGB: 12.1 g/dL (ref 12.0–16.0)
LYMPHS PCT: 45.1 %
Lymphocyte #: 1 x10 3/mm (ref 1.0–3.6)
MCH: 33.8 pg (ref 26.0–34.0)
MCHC: 34.5 g/dL (ref 32.0–36.0)
MCV: 98 fL (ref 80–100)
Monocyte #: 0.2 x10 3/mm (ref 0.2–0.9)
Monocyte %: 10.6 %
NEUTROS ABS: 1 x10 3/mm — AB (ref 1.4–6.5)
Neutrophil %: 43.2 %
Platelet: 242 x10 3/mm (ref 150–440)
RBC: 3.58 10*6/uL — ABNORMAL LOW (ref 3.80–5.20)
RDW: 16.6 % — AB (ref 11.5–14.5)
WBC: 2.3 x10 3/mm — AB (ref 3.6–11.0)

## 2014-09-11 ENCOUNTER — Ambulatory Visit
Admit: 2014-09-11 | Disposition: A | Discharge status: Home / Self Care | Payer: Self-pay | Attending: Oncology | Admitting: Oncology

## 2014-09-15 DIAGNOSIS — C569 Malignant neoplasm of unspecified ovary: Secondary | ICD-10-CM

## 2014-09-15 LAB — CREATININE, SERUM: CREATINE, SERUM: 0.89

## 2014-09-22 LAB — CBC CANCER CENTER
Basophil #: 0 x10 3/mm (ref 0.0–0.1)
Basophil %: 0.6 %
Eosinophil #: 0 x10 3/mm (ref 0.0–0.7)
Eosinophil %: 0.1 %
HCT: 33.6 % — ABNORMAL LOW (ref 35.0–47.0)
HGB: 11.7 g/dL — ABNORMAL LOW (ref 12.0–16.0)
Lymphocyte #: 1 x10 3/mm (ref 1.0–3.6)
Lymphocyte %: 31.7 %
MCH: 34.6 pg — ABNORMAL HIGH (ref 26.0–34.0)
MCHC: 34.9 g/dL (ref 32.0–36.0)
MCV: 99 fL (ref 80–100)
Monocyte #: 0.2 x10 3/mm (ref 0.2–0.9)
Monocyte %: 7.4 %
Neutrophil #: 1.9 x10 3/mm (ref 1.4–6.5)
Neutrophil %: 60.2 %
Platelet: 52 x10 3/mm — ABNORMAL LOW (ref 150–440)
RBC: 3.38 10*6/uL — ABNORMAL LOW (ref 3.80–5.20)
RDW: 17.3 % — ABNORMAL HIGH (ref 11.5–14.5)
WBC: 3.1 x10 3/mm — ABNORMAL LOW (ref 3.6–11.0)

## 2014-09-22 LAB — COMPREHENSIVE METABOLIC PANEL
Albumin: 3 g/dL — ABNORMAL LOW
Alkaline Phosphatase: 45 U/L
Anion Gap: 3 — ABNORMAL LOW (ref 7–16)
BUN: 16 mg/dL
Bilirubin,Total: 0.6 mg/dL
Calcium, Total: 9.1 mg/dL
Chloride: 110 mmol/L
Co2: 23 mmol/L
Creatinine: 0.84 mg/dL
EGFR (African American): >60
EGFR (Non-African Amer.): >60
Glucose: 124 mg/dL — ABNORMAL HIGH
Potassium: 3.6 mmol/L
SGOT(AST): 23 U/L
SGPT (ALT): 11 U/L — ABNORMAL LOW
Sodium: 136 mmol/L
Total Protein: 5.5 g/dL — ABNORMAL LOW

## 2014-09-29 LAB — BASIC METABOLIC PANEL
Anion Gap: 3 — ABNORMAL LOW (ref 7–16)
BUN: 17 mg/dL
CO2: 24 mmol/L
CREATININE: 0.84 mg/dL
Calcium, Total: 8.8 mg/dL — ABNORMAL LOW
Chloride: 111 mmol/L
EGFR (African American): >60
EGFR (Non-African Amer.): >60
Glucose: 102 mg/dL — ABNORMAL HIGH
POTASSIUM: 3.3 mmol/L — AB
Sodium: 138 mmol/L

## 2014-09-29 LAB — CBC CANCER CENTER
BASOS PCT: 0.6 %
Basophil #: 0 x10 3/mm (ref 0.0–0.1)
Eosinophil #: 0 x10 3/mm (ref 0.0–0.7)
Eosinophil %: 1.6 %
HCT: 33.6 % — AB (ref 35.0–47.0)
HGB: 11.7 g/dL — AB (ref 12.0–16.0)
Lymphocyte #: 1.1 x10 3/mm (ref 1.0–3.6)
Lymphocyte %: 37.5 %
MCH: 35 pg — ABNORMAL HIGH (ref 26.0–34.0)
MCHC: 34.8 g/dL (ref 32.0–36.0)
MCV: 100 fL (ref 80–100)
Monocyte #: 0.2 x10 3/mm (ref 0.2–0.9)
Monocyte %: 6.3 %
NEUTROS ABS: 1.6 x10 3/mm (ref 1.4–6.5)
Neutrophil %: 54 %
Platelet: 105 x10 3/mm — ABNORMAL LOW (ref 150–440)
RBC: 3.35 10*6/uL — ABNORMAL LOW (ref 3.80–5.20)
RDW: 18 % — ABNORMAL HIGH (ref 11.5–14.5)
WBC: 3 x10 3/mm — AB (ref 3.6–11.0)

## 2014-10-03 NOTE — Consult Note (Signed)
Imaging has been reviewed. Patient with at least 4 metastatic focus in brain. CT scan of chest, abdomen, and pelvis is unrevealing in regards to primary lesion. Agree that ovarian abnormality is not the source of metastatic disease, but it is possible.  Have ordered tumor markers and repeat mammogram for further workup. Have also consulted palliative care as well as radiation oncology. Full consult to follow. consult, will follow.  Electronic Signatures: Delight Hoh (MD)  (Signed on 15-Dec-15 09:11)  Authored  Last Updated: 15-Dec-15 09:11 by Delight Hoh (MD)

## 2014-10-03 NOTE — H&P (Signed)
PATIENT NAME:  Amanda Robbins, Amanda Robbins MR#:  [id number] DATE OF BIRTH:  1940/08/23  PRIMARY CARE PHYSICIAN:  Anderson Malta A. Gilford Rile, MD  CHIEF COMPLAINT: Headache and nausea for 1 week.   HISTORY OF PRESENT ILLNESS: This is a 74 year old female who has been having headache and nausea going on for a week. The headache is different than her migraine. She is not having any aura.  Headache has been constant, more in the back of the head coming over to the front of the head. Normally with her migraine she does get an aura over her right eye. She has been having ankle and leg swelling going on 4 weeks and groin pain going on for over a week. She had an ultrasound as an outpatient of the right lower extremity that showed no DVT. Looking back at prior cancer screening, she had a mammogram 12/17/2012 that was negative. Actually, had a CT scan of the abdomen and pelvis, 2014, without contrast, which was negative. The patient had an endoscopy and colonoscopy in March 2013 negative for cancer at that time. In the ER the patient had an MRI of the brain and CAT scan of the brain that showed at least 4, possibly 5, intracranial lesions with central necrosis, marked vasogenic edema, metastatic disease is likely. No restricted diffusion centrally of the lesions. Mass effect, most prominent in view of the left cerebellar lesion with slight displacement of the 4th ventricle. Right temporal lobe lesion is smaller without frank uncal herniation. The patient states her strength is okay and her balance is okay and coordination is okay at this point.   PAST MEDICAL HISTORY: COPD, anemia, osteoporosis, migraines, neuropathy, plantar fasciitis.   PAST SURGICAL HISTORY: Cholecystectomy, C-section, breast biopsy, which was benign.   ALLERGIES: ADVIL, ALEVE, ASPIRIN, CODEINE, FOSAMAX AND PERCOCET.   MEDICATIONS AT HOME:  Include Ambien 5 mg 1/2 to 1 tablet at bedtime as needed for inability to sleep, Topamax 25 mg daily, tramadol 50 mg every 4-6  hours as needed for pain, Tussionex Pennkinetic 8 mg, 10 mg per 5 mL once a day at bedtime as needed for cough. Tylenol 500 mg 2 tablets every 6 hours as needed for pain.   SOCIAL HISTORY: Quit smoking in 1991. No alcohol. No drug use. 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   FAMILY HISTORY: Both parents died, likely of alcoholic cirrhosis.   REVIEW OF SYSTEMS:  CONSTITUTIONAL: Positive for fatigue. No fever, chills, or sweats. No weight loss.  EYES: No blurry vision.  EARS, NOSE, MOUTH, AND THROAT: No hearing loss. No sore throat. No difficulty swallowing.  CARDIOVASCULAR: No chest pain. No palpitations.  RESPIRATORY: Occasional shortness of breath. Occasional cough. No sputum. No hemoptysis.  GASTROINTESTINAL: Some lower abdominal pain. Sometimes has some bowel issues with constipation.  Positive for nausea, but no vomiting. No bright red blood per rectum. No melena.  GENITOURINARY: No burning on urination. No hematuria.  MUSCULOSKELETAL: Positive for knee pain and right groin pain.  INTEGUMENT: No rashes or eruptions.  NEUROLOGIC: No fainting or blackouts.  PSYCHIATRIC: No anxiety or depression.  ENDOCRINE: No thyroid problems.  HEMATOLOGIC AND LYMPHATIC: History of anemia.   PHYSICAL EXAMINATION: VITAL SIGNS: Temperature 98.1, pulse 72, respirations 20, blood pressure 118/72, pulse oximetry 98% on room air.  GENERAL: No respiratory distress.  EYES: Conjunctivae and lids normal. Pupils equal, round, and reactive to light. Extraocular muscles intact. No nystagmus.  EARS, NOSE, MOUTH, AND THROAT: Tympanic membranes, no erythema. Nasal mucosa,  no erythema. Throat,  no erythema, no exudate seen.  Lips and gums, no lesions. NECK: No JVD. No bruits. No lymphadenopathy. No thyromegaly. No thyroid nodules palpated.  RESPIRATORY: Lungs clear to auscultation. No use of accessory muscles to breathe. No rhonchi, rales, or wheeze heard.  CARDIOVASCULAR: S1, S2 normal. No gallops, rubs, or  murmurs heard. Carotid upstroke 2+ bilaterally. No bruits. Dorsalis pedis pulses 2+ bilaterally. Trace edema of the lower extremity.  ABDOMEN: Soft, nontender. No organomegaly, splenomegaly. Normoactive bowel sounds. No masses felt.  LYMPHATIC: No lymph nodes in the neck.  MUSCULOSKELETAL: No clubbing, trace edema, cyanosis.  SKIN: No ulcers or lesions.  NEUROLOGIC: Cranial nerves II through XII grossly intact. Deep tendon reflexes 2+ bilateral lower extremities. Power 5/5 upper and lower extremities. Babinski negative bilaterally. Finger-nose slightly impaired, left.  PSYCHIATRIC: The patient is oriented to person, place, and time.   LABORATORY AND RADIOLOGICAL DATA: Glucose 125, BUN 13, creatinine 0.8, sodium 138, potassium 4.1, chloride 110, CO2 of 23, calcium 9.0. White blood cell count 6.4, H and H 14.3 and 43.1, platelet count of 122,000. CT scan of the head without contrast showed vasogenic edema of the right frontal lobe, right temporal lobe, and left superior cerebellum most consistent with metastatic disease. MRI of the brain with and without contrast shows at least 4, possibly 5,  intracranial lesions with central necrosis and marked vasogenic edema, metastatic disease is likely. Mass effect more prominent on the left cerebellar lesion with slight displacement of the 4th ventricle.   ASSESSMENT AND PLAN: 1.  Brain metastases, unknown primary at this point. Breast exam by me was negative.   Lymphatic exam negative  in the axilla, neck, and groin. I will get a CT scan of the chest, abdomen, and pelvis for further evaluation. Empiric Decadron for edema of the brain, empiric  Keppra to prevent seizure. Likely will need a biopsy, but will wait for CT scan results to figure out where to go for a biopsy.  2.  Chronic obstructive pulmonary disease, respiratory status stable.  3.  Migraines, on Topamax.  4.  Neuropathy.  5.  Osteoporosis.  6.  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  TIME SPENT ON ADMISSION: 55 minutes.   CODE STATUS: The patient is a full code.    ____________________________ Tana Conch. Leslye Peer, MD rjw:LT D: 05/25/2014 17:27:06 ET T: 05/25/2014 18:09:12 ET JOB#: [id number]  cc: Tana Conch. Leslye Peer, MD, <Dictator> Eduard Clos. Gilford Rile, MD Marisue Brooklyn MD ELECTRONICALLY SIGNED 06/03/2014 15:44

## 2014-10-03 NOTE — Discharge Summary (Signed)
PATIENT NAME:  Amanda Robbins, Amanda Robbins MR#:  [id number] DATE OF BIRTH:  21-Jun-1940  DATE OF ADMISSION:  05/25/2014 DATE OF DISCHARGE:  05/27/2014  PRIMARY CARE PHYSICIAN: Anderson Malta A. Gilford Rile, MD   FINAL DIAGNOSES:  1.  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  2.  Chronic obstructive pulmonary disease.  3.  Thrombocytopenia.  4.  History of migraine.  5.  Osteoporosis.   MEDICATIONS ON DISCHARGE: Include Ambien 5 mg half to 1 tablet once a day at bedtime as needed for inability to sleep, Topamax 25 mg daily, Tylenol 500 mg 2 tablets every 6 hours as needed for pain, dexamethasone 4 mg twice a day, Keppra 500 mg every 12 hours, morphine 20 mg/mL 0.25 mL every 4 hours as needed for moderate pain.   DIET: Regular diet, regular consistency.   ACTIVITY: As tolerated.   FOLLOWUP: With Dr. Baruch Gouty 2:00 p.m. today upon discharge; Dr. Grayland Ormond 9:45 a.m. on Friday.   HOSPITAL COURSE: The patient came in with headache and nausea going on for a week and was found to have brain metastases.   LABORATORY AND RADIOLOGICAL DATA DURING THE HOSPITAL COURSE: Includes white blood cell count of 6.4 hemoglobin and hematocrit 14.3 and 43.1, platelet count of 122,000, glucose 125, BUN 13, creatinine 0.80, sodium 138, potassium 4.1, chloride 110, CO2 of 23.   EKG: Normal sinus rhythm, left atrial enlargement, incomplete right bundle branch block.   CT scan of the head showed vasogenic edema in the right frontal lobe, right temporal lobe and left superior cerebellum most consistent with metastatic disease.   MRI of the brain with and without contrast showed at least 4 and possibly 5 intracranial lesions with central necrosis and marked vasogenic edema, metastatic disease is likely. Mass effect most prominent due to left cerebellar lesion with slight displacement of the fourth ventricle.   CT scan of the chest, abdomen, and pelvis with contrast showed a 10 cm cystic mass on the right ovary  likely epithelial neoplasm, a 6 mm nodule in the right retroperitoneum.   Mammogram: No mammographic evidence of breast malignancy.   CEA 1.6, CA 19-9 of 26, CA 27..29 which result is 29.4, CA-125 of 38.2.   Stool for Clostridium difficile was negative.   PET/CT scan consistent with cystic right adnexal mass with hypermetabolic mural soft tissue nodule highly suspicious for ovarian carcinoma. A small, less than 1 cm, hypermetabolic lymph node in the right inguinal region, right external iliac chain, left para-aortic region suspicious for metastatic disease.   HOSPITAL COURSE PER PROBLEM LIST:  1.  For the patient's brain metastases, possibly ovarian primary: The patient does have brain edema. The patient was started on IV Decadron, switched over to oral Decadron upon going home. The patient was empirically put on Keppra to prevent seizure. The patient still had a little bit of a headache upon going home, but was walking around steady. She is going to be set up with Dr. Baruch Gouty at 2:00 p.m. on December 16 to be set up for radiation to the brain. Follow up with Dr. Grayland Ormond on Friday to determine a way of getting tissue diagnosis, possibly will end up needing gynecology oncology evaluation for tissue diagnosis if the right inguinal lymph node is too small to get tissue from. Close clinical followup as outpatient needed. Overall prognosis is poor and any treatments will be palliative in nature.  2.  COPD: Respiratory status stable.  3.  Thrombocytopenia, likely from the cancerous process.  4.  Migraines  on Topamax.  5.  Osteoporosis: Would like to get off the dexamethasone of the lowest possible dose as soon as possible. Can consider Fosamax as outpatient.   TIME SPENT ON DISCHARGE: 35 minutes.    ____________________________ Tana Conch. Leslye Peer, MD rjw:bm D: 05/27/2014 15:19:35 ET T: 05/28/2014 00:01:39 ET JOB#: [id number]  cc: Tana Conch. Leslye Peer, MD, <Dictator> Eduard Clos. Gilford Rile, MD Armstead Peaks, MD Kathlene November. Grayland Ormond, MD   Marisue Brooklyn MD ELECTRONICALLY SIGNED 06/03/2014 15:45

## 2014-10-05 LAB — SURGICAL PATHOLOGY

## 2014-10-06 ENCOUNTER — Ambulatory Visit: Payer: Medicare Other | Attending: Oncology | Admitting: Physical Therapy

## 2014-10-06 DIAGNOSIS — I890 Lymphedema, not elsewhere classified: Secondary | ICD-10-CM

## 2014-10-06 NOTE — Therapy (Signed)
[place], [place], [address] Phone: [phone]   Fax:  [phone]  Physical Therapy Evaluation  Patient Details  Name: Amanda Robbins MRN: [id number] Date of Birth: 08-May-1941 Referring Provider:  Lloyd Huger, MD  Encounter Date: 10/06/2014      PT End of Session - 10/06/14 1226    Visit Number 1   Number of Visits 9   Date for PT Re-Evaluation 11/10/14   PT Start Time 1884   PT Stop Time 1102   PT Time Calculation (min) 47 min   Activity Tolerance Patient tolerated treatment well   Behavior During Therapy [facility] for tasks assessed/performed      Past Medical History  Diagnosis Date  . 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 Irritable bowel   . C. difficile colitis 2011  . Allergic rhinitis     Followed by Dr. Donneta Romberg  . Esophageal reflux     Followed by GI physician, on Dexilant and Ranitidine  . Migraine   . 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 Osteoporosis 2009    on Reclast once yearly, Followed by Dr. Jefm Bryant    Past Surgical History  Procedure Laterality Date  . Bil roken wrists    . Gallbladder removed    . Bilateral bunionectomies    . Cesarean section      There were no vitals filed for this visit.  Visit Diagnosis:  Lymphedema of both lower extremities      Subjective Assessment - 10/06/14 1223    Subjective pt comes in to our clinic to get help with swelling in her legs, especailly her thighs.  she says her lower legs are better today than they have been.  Overall, her symptoms of fatigue vary according to when she has had her chemo.  She really can't do much the week she has chemo.    Pertinent History ovarian cancer with metastasis to the brain December 2015, 10 radiation treatments to brain to treat mets.  Total abdominal  hysterectomy, currently undergoing chemotherapy.  Developed swelling in thighs, lower legs and ankles.  she  has had home health therapy at home for mobility, she does ace wrap her ankles every. day.  she  says she has numbness or tingling in her toes that she has had for a long time    She reports she had a PE in [place]    Patient Stated Goals to get rid of the swelling,    Currently in Pain? Yes  when she walks   Pain Score 5    Pain Location Leg   Pain Orientation Right;Left   Pain Descriptors / Indicators Burning   Pain Onset More than a month ago   Aggravating Factors  walking   Pain Relieving Factors massage, foot bath            [facility] PT Assessment - 10/06/14 0001    Assessment   Medical Diagnosis ovarian cancer    Onset Date 05/12/14   Precautions   Precautions --  cancer   Restrictions   Weight Bearing Restrictions No   Balance Screen   Has the patient fallen in the past 6 months Yes   How many times? 1  pt has a fear of falling   Has the patient had a decrease in activity level because of a fear of falling?  Yes  because of chemo.. She uses her walker for security  Is the patient reluctant to leave their home because of a fear of falling?  Yes  she doesn't go anywhere without her husband    Chief Financial Officer Private residence   Living Arrangements Spouse/significant other   Available Help at Discharge Available 24 hours/day;Family   Type of [place] to enter   Entrance Stairs-Number of Steps 4   Entrance Stairs-Rails None  husband will install a handrail    Home Layout One level   [place] - 2 wheels;Bedside commode;Shower seat   Prior Function   Level of Independence Requires assistive device for independence;Independent with basic ADLs;Independent with homemaking with ambulation  depends on timing around chemotherapy   Leisure go out to eat, shopping with husband, familty, grandchildren   Cognition   Overall Cognitive Status Within Functional Limits for tasks assessed   Observation/Other Assessments    Observations walks in with walker with forward flexed posture and appears to be frail.  she has obvious edema in both thighs with pinkness to skin of anterior and porximal thighs that is not tender to touch,   Skin Integrity intact    Sensation   Light Touch Appears Intact  pt reports subjective numbness and tingling   Coordination   Gross Motor Movements are Fluid and Coordinated No  pt moves slowly when up on her feet   Fine Motor Movements are Fluid and Coordinated Yes  able to don/doff slacks, socks, shoes   Posture/Postural Control   Posture/Postural Control Postural limitations   Postural Limitations Rounded Shoulders;Forward head   AROM   Overall AROM  Within functional limits for tasks performed   Strength   Overall Strength Deficits  appears to have generalized weakness, about 3+/5   Ambulation/Gait   Ambulation/Gait --  pt uses rolling walker or holds onto husband's arm           LYMPHEDEMA/ONCOLOGY QUESTIONNAIRE - 10/06/14 1047    Type   Cancer Type ovarian   Treatment   Active Chemotherapy Treatment Yes  every 4 weeks   Past Radiation Treatment Yes   Date 07/07/14   Body Site brain   What other symptoms do you have   Are you Having Heaviness or Tightness Yes   Are you having Pain Yes   Are you having pitting edema Yes   Body Site bilateral posterior medical thighs, mons pubis   Is it Hard or Difficult finding clothes that fit Yes  somtimes can't get shoes on   Do you have infections No   Is there Decreased scar mobility Yes  abdominal scar   Stemmer Sign No   Lymphedema Stage   Stage STAGE 2 SPONTANEOUSLY IRREVERSIBLE  pt states it is better, doesn't completely reverse   Right Lower Extremity Lymphedema   30 cm Proximal to Suprapatella 56.5 cm   20 cm Proximal to Suprapatella 57 cm   10 cm Proximal to Suprapatella 48.5 cm   At Midpatella/Popliteal Crease 37.5 cm   30 cm Proximal to Floor at Lateral Plantar Foot 35.5 cm   20 cm Proximal to Floor at  Lateral Plantar Foot 29.5 1   10  cm Proximal to Floor at Lateral Malleoli 21.4 cm   5 cm Proximal to 1st MTP Joint 22.9 cm   Around Proximal Great Toe 6.9 cm   Left Lower Extremity Lymphedema   30 cm Proximal to Suprapatella 55 cm   20 cm Proximal to Suprapatella 54 cm   10  cm Proximal to Suprapatella 47 cm   At Midpatella/Popliteal Crease 35.3 cm   30 cm Proximal to Floor at Lateral Plantar Foot 34.9 cm   20 cm Proximal to Floor at Lateral Plantar Foot 29.9 cm   10 cm Proximal to Floor at Lateral Malleoli 21.8 cm   5 cm Proximal to 1st MTP Joint 22 cm   Around Proximal Great Toe 6.9 cm                           Short Term Clinic Goals - 11-05-14 1757    CC Short Term Goal  #1   Title short term goals= long term goals              Long Term Clinic Goals - 2014-11-05 1757    CC Long Term Goal  #1   Title Patient will verbalize understanding of lymphedema risk reduction practices   Time 4   Period Weeks   Status New   CC Long Term Goal  #2   Title Patient will have a circumferential reduction of     2    cm at     20     cm above the   right suprapatella    Baseline 57   Time 4   Period Weeks   Status New   CC Long Term Goal  #3   Title Patient will be know how to obtain and use compression garments for maintenance phase of treatment   Time 4   Period Weeks   Status New   CC Long Term Goal  #4   Title Patient will be independent in self manual lymph drainage   Time 4   Period Weeks   Status New            Plan - Nov 05, 2014 1753    Clinical Impression Statement 74 you currently undergoin chemotherapy for ovarian cancer after surgical treatment presents with lymphedema of both lower legs and generalized weakness.    Pt will benefit from skilled therapeutic intervention in order to improve on the following deficits Decreased strength;Decreased knowledge of precautions;Decreased knowledge of use of DME;Difficulty walking;Increased edema;Decreased  scar mobility;Decreased endurance;Increased fascial restricitons   Rehab Potential Good   Clinical Impairments Affecting Rehab Potential ongoing chemotherapy every 4 weeks   PT Frequency 2x / week   PT Duration 4 weeks   PT Treatment/Interventions ADLs/Self Care Home Management;Therapeutic exercise;Compression bandaging;Scar mobilization;DME Instruction;Patient/family education;Manual techniques;Manual lymph drainage;Therapeutic activities   PT Next Visit Plan Manual lymph draiange to both lower extremities, exlpore use of athletic compression leggings. begin remdial exercise istruction if time    Consulted and Agree with Plan of Care Patient          G-Codes - 11-05-14 1759    Functional Assessment Tool Used clincial judgement   Functional Limitation Self care   Self Care Current Status [phone]) At least 40 percent but less than 60 percent impaired, limited or restricted   Self Care Goal Status ([id number]) At least 1 percent but less than 20 percent impaired, limited or restricted       Problem List Patient Active Problem List   Diagnosis Date Noted  . Ovarian cancer 08/31/2014  . 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 Brain metastasis 06/01/2014  . Abdominal cramping 02/03/2014  . Dizzinesses 05/26/2013  . Medicare annual wellness visit, subsequent 11/22/2011  . GERD (gastroesophageal reflux disease) 08/22/2011  . Neuropathy 05/24/2011  . Insomnia  05/24/2011  . Osteoporosis 05/24/2011  . IBS (irritable bowel syndrome) 05/24/2011  . Migraine 05/24/2011  . Thrombocytopenia 05/24/2011  . Hyperlipidemia 05/24/2011   Donato Heinz. Owens Shark PT   Norwood Levo 10/06/2014, 6:00 PM  [place], [place], [address] Phone: [phone]   Fax:  [phone]

## 2014-10-08 ENCOUNTER — Ambulatory Visit: Payer: Medicare Other

## 2014-10-08 DIAGNOSIS — I890 Lymphedema, not elsewhere classified: Secondary | ICD-10-CM

## 2014-10-08 NOTE — Therapy (Signed)
[place], [place], [address] Phone: [phone]   Fax:  [phone]  Physical Therapy Treatment  Patient Details  Name: Amanda Robbins MRN: [id number] Date of Birth: [birth year] Referring Provider:  Jackolyn Confer, MD  Encounter Date: 10/08/2014      PT End of Session - 10/08/14 1031    Visit Number 2   Number of Visits 9   Date for PT Re-Evaluation 11/10/14   PT Start Time 1019   PT Stop Time 1100   PT Time Calculation (min) 41 min      Past Medical History  Diagnosis Date  . 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 Irritable bowel   . C. difficile colitis 2011  . Allergic rhinitis     Followed by Dr. Donneta Romberg  . Esophageal reflux     Followed by GI physician, on Dexilant and Ranitidine  . Migraine   . 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 Osteoporosis 2009    on Reclast once yearly, Followed by Dr. Jefm Bryant    Past Surgical History  Procedure Laterality Date  . Bil roken wrists    . Gallbladder removed    . Bilateral bunionectomies    . Cesarean section      There were no vitals filed for this visit.  Visit Diagnosis:  Lymphedema of both lower extremities      Subjective Assessment - 10/08/14 1022    Subjective Ready to get the swelling out of my legs, its mostly in my thighs and groin area.    Currently in Pain? Yes   Pain Score 4    Pain Location Ankle   Pain Orientation Right;Left   Pain Descriptors / Indicators Sore;Tightness   Aggravating Factors  walking, ankle pumps but I do them anyway    Pain Relieving Factors massage, foot bath                         [facility] Adult PT Treatment/Exercise - 10/08/14 0001    Manual Therapy   Manual Lymphatic Drainage (MLD) In Supine: Short neck, Lt axillary nodes, superficial and deep abdominals, Lt lower quadrant, Lt inguino-axillary anastomosis and Lt LE from dorsal foot to lateral  thigh, then same on Rt, instructin gpt throuhgout in basic principle of manual lymph drainage.                PT Education - 10/08/14 1104    Education provided Yes   Education Details Standing hip 3 way exercises (flexion, abduction, and extension) and heel raises.   Person(s) Educated Patient   Methods Explanation;Demonstration  She already has handouts for these   Comprehension Verbalized understanding;Returned demonstration           Short Term Clinic Goals - 10/06/14 1757    CC Short Term Goal  #1   Title short term goals= long term goals              Long Term Clinic Goals - 10/06/14 1757    CC Long Term Goal  #1   Title Patient will verbalize understanding of lymphedema risk reduction practices   Time 4   Period Weeks   Status New   CC Long Term Goal  #2   Title Patient will have a circumferential reduction of     2    cm at     20     cm  above the   right suprapatella    Baseline 57   Time 4   Period Weeks   Status New   CC Long Term Goal  #3   Title Patient will be know how to obtain and use compression garments for maintenance phase of treatment   Time 4   Period Weeks   Status New   CC Long Term Goal  #4   Title Patient will be independent in self manual lymph drainage   Time 4   Period Weeks   Status New            Plan - 10/08/14 1100    Clinical Impression Statement Began initial instruction today of manual lymph drainage while performing on pt today. She verbalized good understanding of sequence. Pt also to begin incorporating standing LE exercises (see education section) during times of prolonged sitting ot promote lymph flow.    Pt will benefit from skilled therapeutic intervention in order to improve on the following deficits Decreased strength;Decreased knowledge of precautions;Decreased knowledge of use of DME;Difficulty walking;Increased edema;Decreased scar mobility;Decreased endurance;Increased fascial restricitons   Rehab  Potential Good   Clinical Impairments Affecting Rehab Potential ongoing chemotherapy every 4 weeks   PT Frequency 2x / week   PT Duration 4 weeks   PT Treatment/Interventions ADLs/Self Care Home Management;Therapeutic exercise;Compression bandaging;Scar mobilization;DME Instruction;Patient/family education;Manual techniques;Manual lymph drainage;Therapeutic activities   PT Next Visit Plan Continue Manual lymph draianage to both lower extremities, issue handout for this and instruct husband if pt wants. Pt to get compression leggings in nxt few days.    Consulted and Agree with Plan of Care Patient        Problem List Patient Active Problem List   Diagnosis Date Noted  . Ovarian cancer 08/31/2014  . 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 Brain metastasis 06/01/2014  . Abdominal cramping 02/03/2014  . Dizzinesses 05/26/2013  . Medicare annual wellness visit, subsequent 11/22/2011  . GERD (gastroesophageal reflux disease) 08/22/2011  . Neuropathy 05/24/2011  . Insomnia 05/24/2011  . Osteoporosis 05/24/2011  . IBS (irritable bowel syndrome) 05/24/2011  . Migraine 05/24/2011  . Thrombocytopenia 05/24/2011  . Hyperlipidemia 05/24/2011    Otelia Limes, PTA 10/08/2014, 12:01 PM  [place], [place], [address] Phone: [phone]   Fax:  [phone]

## 2014-10-08 NOTE — Patient Instructions (Signed)
Flexion and Extension - Standing   Stand and support self while swinging uninvolved leg and hip forward and backward ___ times. Repeat with involved leg and hip. Do ___ times per day.  Copyright  VHI. All rights reserved.  Hip (Side)

## 2014-10-11 NOTE — Op Note (Signed)
PATIENT NAME:  Amanda Robbins, Amanda Robbins MR#:  [id number] DATE OF BIRTH:  August 05, 1940  DATE OF PROCEDURE:  07/30/2014  PREOPERATIVE DIAGNOSIS:  Ovarian cancer with brain metastasis, poor venous access.    POSTOPERATIVE DIAGNOSES: Ovarian cancer with brain metastasis, poor venous access.   PROCEDURES:  1.  Ultrasound guidance for vascular access, right internal vein.  2.  Fluoroscopic guidance for placement of catheter.  3.  Placement of CT compatible Port-A-Cath, right internal vein.   SURGEON:  Leotis Pain, MD  ANESTHESIA:  Local with moderate conscious sedation.   FLUOROSCOPY TIME:  Less than 1 minute.   CONTRAST:  Zero.   ESTIMATED BLOOD LOSS:  Minimal.  INDICATION FOR PROCEDURE:  The patient is a 74 year old white female with ovarian cancer with brain metastasis and needs a Port-A-Cath for chemotherapy and durable venous access.  Risks and benefits were discussed. Informed consent was obtained.   DESCRIPTION OF THE PROCEDURE:  The patient was brought to the vascular and interventional radiology suite. The right neck and chest were sterilely prepped and draped, and a sterile surgical field was created. Ultrasound was used to help visualize a patent right internal jugular vein. This was then accessed under direct ultrasound guidance without difficulty with a Seldinger needle and a permanent image was recorded. A J-wire was placed. After skin nick and dilatation, the peel-away sheath was then placed over the wire. I then anesthetized an area under the clavicle approximately 2 fingerbreadths. A transverse incision was created and an inferior pocket was created with electrocautery and blunt dissection. The port was then brought onto the field, placed into the pocket and secured to the chest wall with 2 Prolene sutures. The catheter was connected to the port and tunneled from the subclavicular incision to the access site. Fluoroscopic guidance was used to cut the catheter to an appropriate length. The catheter  was then placed through the peel-away sheath and the peel-away sheath was removed. The catheter tip was parked in excellent location in the distal superior vena cava. The pocket was then irrigated with antibiotic-impregnated saline and the wound was closed with a running 3-0 Vicryl and a 4-0 Monocryl. The access incision was closed with a single 4-0 Monocryl. The Huber needle was used to withdraw blood and flush the port with heparinized saline. Dermabond was then placed as a dressing. The patient tolerated the procedure well and was taken to the recovery room in stable condition.    ____________________________ Algernon Huxley, MD jsd:mc D: 07/30/2014 12:18:10 ET T: 07/30/2014 12:45:21 ET JOB#: [id number]  cc: Algernon Huxley, MD, <Dictator> Algernon Huxley MD ELECTRONICALLY SIGNED 08/05/2014 16:39

## 2014-10-11 NOTE — H&P (Signed)
PATIENT NAME:  Amanda Robbins, Amanda Robbins MR#:  [id number] DATE OF BIRTH:  11/05/1940  DATE OF ADMISSION:  07/21/2014  PRIMARY CARE PHYSICIAN: Dr. Ronette Deter.    ONCOLOGIST:  Dr. Grayland Ormond.    HISTORY OF PRESENT ILLNESS:  The patient is a 74 year old Caucasian female with past medical history significant for history of ovarian carcinoma, metastasis to brain, status post total laparoscopic hysterectomy and bilateral salpingo-oophorectomy on 07/08/2014, who presents to the hospital with complaints of right-sided chest pains as well as shortness of breath.  According to the patient she was doing well up until today approximately 9:30-10:00 a.m. when she was suddenly having right-sided chest pain. Pain was described as pleuritic chest pain, increasing whenever she takes deep breath, and localized in the lower chest anteriorly. She states that she is not really short of breath, but she cannot take a deep breath. She denies any cough or wheezing or phlegm production.  Denies any blood or fevers or chills. On arrival to the Emergency Room she had an x-ray of her chest done which showed possible pneumonia, however the patient's CT scan of the chest reveals a pulmonary embolism. She admits to having lower extremity swelling which according to her seemed to be improving since the operation. Hospitalist services were contacted for admission.   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   PAST SURGICAL HISTORY: Cholecystectomy, C-section, breast biopsy which  was benign.   ALLERGIES: ADVIL, ALEVE, ASPIRIN, CODEINE, FOSAMAX, AS WELL AS PERCOCET.   MEDICATIONS: According to medical records, medications Carafate 1 tablet 1 gram twice daily, furosemide 10 mg once daily, morphine 10 mg in 1 mL oral concentrate 0.25 mL every 4  hours as needed, Topamax 25 mg p.o. daily, Tylenol 1000 mg every 6 hours as needed   SOCIAL HISTORY: Quit smoking in 1991. No alcohol. No drug abuse. 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   FAMILY HISTORY: The patient's parents died of alcoholic cirrhosis.   REVIEW OF SYSTEMS:  CONSTITUTIONAL:  Positive for pain in the chest, pain with respiration, dyspnea, lower extremity edema which seems to be improving since the surgery, arrhythmia which seem to be chronic, worsening with exertion. Diarrhea 3 days ago. Otherwise denies any fevers, chills, fatigue, weakness, weight loss or gain.   EYES:  Denies any blurry vision, double vision, glaucoma, or cataracts.  EARS, NOSE, AND THROAT:  Denies any tinnitus, allergies, epistaxis, sinus pain, dentures, difficulty swallowing. RESPIRATORY: Denies any cough, wheezes, asthma, COPD.   CARDIOVASCULAR: Denies any orthopnea, palpitations, or syncopal episodes.  GASTROINTESTINAL: Denies any nausea, vomiting, hematemesis, rectal bleeding, change in bowel habits.  GENITOURINARY: Denies dysuria, hematuria, frequency, incontinence.  ENDOCRINE:  Denies polydipsia, nocturia, thyroid problems, heat or cold intolerance, or thirst. HEMATOLOGIC:  Denies anemia, easy bruising, bleeding, or swollen glands.   SKIN: Denies acne, rashes, lesions, or change in moles.  MUSCULOSKELETAL: Denies arthritis, cramps, swelling.   NEUROLOGIC:  Denies numbness, epilepsy, or tremor.  PSYCHIATRIC:  Denies anxiety, insomnia, or depression.   PHYSICAL EXAMINATION:  VITAL SIGNS: On arrival to the hospital the patient's temperature was 97.7, pulse was   respiration rate was 20, blood pressure 120/85, O2 saturation  was  97% on room air.  GENERAL: This is a well-nourished Caucasian female in no significant distress, lying on the stretcher.  HEENT: His pupils are equal, reactive to light.  Extraocular movements intact.  No icterus or conjunctivitis.  Has normal hearing.  No pharyngeal erythema. Mucosa is moist.  NECK: No masses, supple, nontender. Thyroid not enlarged. No adenopathy. No JVD or carotid bruits bilaterally.  Full range of motion. LUNGS: A few rales were heard posteriorly on the right as well as few rhonchi. Somewhat diminished breath sounds on the right. No wheezing. No labored inspirations, increased effort, dullness to percussion, overt respiratory distress.  CARDIOVASCULAR: S1, S2 present, rhythm is regular. PMI not lateralized. Chest is nontender to palpation. 1 + pedal pulses.  2-3 + lower extremity edema was noted bilaterally and calf tenderness was noted on palpation.  ABDOMEN: Soft, nontender. Bowel sounds are present. No hepatosplenomegaly or masses were noted.  RECTAL: Deferred.  EXTREMITIES:  Muscle strength normal in all extremities.   No cyanosis, degenerative joint disease, or kyphosis. Gait was not tested. The patient does have however somewhat difficulty sitting up in the bed because of pain.  SKIN: Does not reveal any rashes, lesions, erythema, nodularity, or induration. It was warm and dry to palpation.  LYMPHATIC: No adenopathy in the cervical region.  NEUROLOGIC: Cranial nerves grossly intact. Sensory intact.  No dysarthria or aphasia. The patient is alert, oriented to time, person, and place, cooperative. Memory is good.  PSYCHIATRIC: No significant confusion, agitation, or depression was noted.   LABORATORY: BMP showed glucose of 85, BUN and creatinine were normal. Her potassium level was low at 3.3, otherwise unremarkable study. Liver enzymes showed albumin level of 2.2, and AST elevated at 43. Cardiac enzymes, troponin was less than 0.02. White blood cell count was normal at 6.5,  hemoglobin was 12.6, platelet count was 213,000. Coagulation panel was unremarkable with pro time 14.8, INR 1.1, and activated PTT 31.3.   RADIOLOGIC STUDIES: CT scan of chest with IV contrast to rule out pulmonary embolism 07/21/2014 revealed positive for pulmonary emboli nonocclusive in the right middle and lower lobe segmental branches, no CT suggestion of right heart strain, new focal consolidation in the right middle lobe and to a lesser degree in the anterior right upper lobe pneumonia versus infarct, a tiny right pleural effusion was also noted. Chest x-ray portable single view 07/21/2014 revealed a new right lower lobe airspace opacity concerning for pneumonia, and underlying COPD.    ASSESSMENT AND PLAN:  1.  Pulmonary embolism. Admit patient to medical floor.  If her lower extremity Doppler study reveals DVT we will initiate the patient on heparin IV and start her on Eliquis. We also would get vascular consultation for possible IVC filter placement, however if the patient has no DVT, she is hemodynamically stable, her oxygen level remains stable, she could take her morphine for pains and will be discharged on Eliquis therapy. She is to follow up with Dr. Grayland Ormond and that was discussed with Dr. Grayland Ormond today.  2.  Lower extremity swelling. Would benefit from echocardiogram to rule out right-sided heart failure or valvular abnormalities. She should continue Lasix and seems to be improving. We will get a Doppler ultrasound to rule out DVT.   3.  Hypokalemia. Supplement p.o. We will also get magnesium level.  4.  Elevated transaminases of unclear etiology. Get CK level.  The patient is asymptomatic however.   TIME SPENT: 50 minutes.    ____________________________ Theodoro Grist, MD rv:bu  D: 07/21/2014 17:25:06 ET T: 07/21/2014 18:23:39 ET JOB#: [id number]  cc: Theodoro Grist, MD, <Dictator> Eduard Clos. Gilford Rile, MD Kathlene November. Grayland Ormond, MD Theodoro Grist MD ELECTRONICALLY SIGNED 08/15/2014  13:23

## 2014-10-11 NOTE — Op Note (Signed)
PATIENT NAME:  Amanda Robbins, Amanda Robbins MR#:  [id number] DATE OF BIRTH:  Sep 10, 1940  DATE OF PROCEDURE:  07/08/2014  PREOPERATIVE DIAGNOSES:  1.  Metastatic malignancy.  2.  10 cm right adnexal mass.   POSTOPERATIVE DIAGNOSES:  1.  Metastatic malignancy.  2.  10 cm right adnexal mass.   PATHOLOGY:  [facility]zen pathology showed a high-grade serous from the right adnexal mass.   PROCEDURE: Total laparoscopic hysterectomy, bilateral salpingo-oophorectomy, and pelvic washings.   SURGEON: Aletha Halim, MD.    ASSISTANT:  Mellody Drown, MD.  ESTIMATED BLOOD LOSS: 50 mL.   INTRAVENOUS FLUIDS: 2 liters crystalloid.   URINE OUTPUT: 250 mL via indwelling Foley catheter.   VENOUS THROMBOEMBOLISM PROPHYLAXIS: SCDs applied to bilateral lower extremities preoperatively.    ANTIBIOTICS: 2 grams Ancef given preoperatively.   COMPLICATIONS: None.   SPECIMENS: washings, cervix, uterus, tubes and ovaries to pathology  FINDINGS:  1.  On diagnostic laparoscopy normal appendix, stomach, and liver edge,  omentum; there was no evidence of carcinomatosis. An  approximately 10 cm right adnexal mass appearing to arise from the ovary was noted and  an adhesion was noted from the anterior aspect of the uterus to the intraabdominal wall consistent with prior C-section, and left lower quadrant filmy pelvic sidewall adhesions were seen and easily lysed 2.  Bilateral ureters were noted and appeared normal.  3. Inadvertant rupture of right adnexal mass  DESCRIPTION OF PROCEDURE: The patient was taken to the operating room where anesthesia was administered. She was prepped and draped in normal sterile fashion in dorsal lithotomies position in [place]. Next a Foley catheter was then inserted and a Vcare device was then inserted without difficulty. Next gloves were then changed and attention was then turned to her abdomen. A 10 mm supraumbilical skin incision was made with a scalpel and using the open technique down  into the abdomen and the balloon trocar was placed, the abdomen insufflated, with the above-noted findings. Next 5 mm ports were then placed in the right and left lower quadrants and a 10 mm suprapubic port was then placed. The anterior uterine wall adhesions were then easily lysed with the Ligasure without difficulty and no evidence of bladder injury. Next using the LigaSure device the bilateral round ligaments were then cauterized and transected, separating the anterior and posterior leaves of the broad ligament, and the bladder flap was then created using blunt and sharp dissection as well as the LigaSure device. The posterior leaves of the broad ligament were then opened and the bilateral ureters were then found at the pelvic brim and traced into the pelvis. On the left side, the peritoneum had to be opened and minimal bowel was stuck to the left pelvic sidewall, were then released with the Endo Shears as well as LigaSure device in order to aid with dissection to find the left ureter. Next the bilateral IP ligaments were then identified and noted to be separate from the ureters and were serially cauterized and then transected without difficulty. Next the bilateral uterine arteries were then skeletonized. On the right side the right adnexal mass was noted to be stuck to the pelvic sidewall and the uterus and was inadvertently ruptured, yielding greenish-brownish fluid which was suctioned. Next the bladder flap was then further created and the bilateral uterine arteries were then serially cauterized and then transected. Next, using the Endo Shear device the top of the uterine manipulator was noted to be clear of the bladder and the colpotomy was made using coag  and then cut current. The specimens were then removed from the vagina. Next the vaginal cuff was then closed using a running suture with the Endo Stitch and the 2-0 V-Loc suture without difficulty. The left aspect of the vaginal cuff was noted to be  slightly oozy, but this was hemostatic using the Endo Shears device on coagulation as well with the Endo Stitch colpotomy suture with excellent hemostasis noted on low pressure. At this time given that she has known metastatic disease and need for further treatment after surgery the decision was made not to proceed with a lymph node dissection or omentectomy. All clots and debris were then removed from the patient's abdomen and all pedicles inspected on low pressure with excellent hemostasis noted. The suprapubic port was then removed and closed with 0 Vicryl and the Endo Close device and the right and left lateral ports were then removed under direct visualization. The supraumbilical fascia was then closed in a running suture using the 0 Vicryl and the supraumbilical and suprapubic ports were then closed. Skin incisions were then closed with 4-0 Monocryl and the right and left lateral quadrant ports were then closed with a Dermabond. Sponge, lap, needle, and instrument counts were correct x 2, and the patient was taken to the recovery room awake, alert, breathing independently, in stable condition.     ____________________________ Aletha Halim, MD cp:bu D: 07/08/2014 10:31:23 ET T: 07/08/2014 14:41:56 ET JOB#: [id number]  cc: Aletha Halim, MD, <Dictator> Aletha Halim MD ELECTRONICALLY SIGNED 07/16/2014 11:51

## 2014-10-11 NOTE — Consult Note (Signed)
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 She had hysterectomy yesterday but was unable to go home secondary to decreased performance status and increasing weakness and fatigue. She is also complaining of significant peripheral neuropathy that has improved since admission. She has now completed her XRT to her brain.  She denies any fevers or illnesses. She denies any chest pain or shortness of breath. She has a fair appetite and denies weight loss. She denies any nausea, vomiting, constipation, or diarrhea. She has no urinary complaints. Patient offers no further specific complaints.   Review of Systems:  Performance Status (ECOG): 2  Review of Systems:   As per HPI. Otherwise, 10 point system review was negative.   Allergies:  ASA: Swelling  Aleve: GI Distress  Advil: GI Distress  Codeine: Itching  Fosamax: GI Distress  Percocet: N/V/Diarrhea  Oxycodone: N/V/Diarrhea  PFSH: Additional Past Medical and Surgical History: COPD, thrombocytopenia, osteoporosis, migraine, plantar fasciitis, cholecystectomy, C-section, lumpectomy for benign breast disease, total hysterectomy.    Family history: Negative and noncontributory.    Social history: Distant tobacco use, denies alcohol.   Home Medications: Medication Instructions Last Modified Date/Time  morphine 20 mg/mL oral concentrate 0.25 milliliter(s) orally every 4 hours, As needed, severe pain (7-10/10) 28-Jan-16 07:59  Carafate 1 g oral tablet 1 tab(s) orally 2 times a day, dissolve in 2-3 tbsp of warm water, swish and swallow 27-Jan-16 06:35  Tylenol 500 mg oral tablet 2 tab(s) orally every 6 hours, As Needed - for Pain 27-Jan-16 06:35  furosemide 20 mg oral tablet 0.5 tab(s) orally once a day 27-Jan-16 06:35  Topamax 25  mg oral tablet 1 tab(s) orally once a day 27-Jan-16 06:35   Vital Signs:  :: vital signs stable, patient afebrile.   Physical Exam:  General: well developed, well nourished, and in no acute distress  Mental Status: normal affect  Eyes: anicteric sclera  Respiratory: clear to auscultation bilaterally  Cardiovascular: regular rate and rhythm, no murmur, rub, or gallop  Gastrointestinal: soft, nondistended, nontender, no organomegaly.  normal active bowel sounds  Musculoskeletal: 2+ bilateral lower extremity edema.  Skin: No rash or petechiae noted  Neurological: alert, answering all questions appropriately.  Cranial nerves grossly intact   Laboratory Results: Routine BB:  18-Jan-16 15:21   ABO Group + Rh Type A Negative  Antibody Screen NEGATIVE (Result(s) reported on 29 Jun 2014 at 05:18PM.)  Lab:  18-Jan-16 15:25   pH (ABG)  7.490 (7.350-7.450 NOTE: New Reference Range 01/03/14)  PCO2  29 (32-48 NOTE: New Reference Range 01/20/14)  PO2 90 (83-108 NOTE: New Reference Range 01/03/14)  FiO2 21  Base Excess -0 (-3-3 NOTE: New Reference Range 01/20/14)  HCO3 22.1 (21.0-28.0 NOTE: New Reference Range 01/03/14)  O2 Saturation 98.2  Specimen Site (ABG) LT BRACHIAL  Specimen Type (ABG) ARTERIAL  Patient Temp (ABG) 37.0 (Result(s) reported on 29 Jun 2014 at 03:34PM.)  Routine Chem:  18-Jan-16 15:21   Glucose, Serum 93  BUN  37  Creatinine (comp) 0.88  Sodium, Serum 140  Potassium, Serum 4.2  Chloride, Serum  108  CO2, Serum 22  Calcium (Total), Serum 8.5  Anion Gap 10  Osmolality (calc) 288  eGFR (African American) >60  eGFR (Non-African American) >60 (eGFR values <31m/min/1.73 m2 may be an indication of chronic kidney disease (CKD). Calculated  eGFR, using the MRDR Study equation, is useful in  patients with stable renal function. The eGFR calculation will not be reliable in acutely ill patients when serum creatinine is changing rapidly. It is not useful in patients on  dialysis. The eGFR calculation may not be applicable to patients at the low and high extremes of body sizes, pregnant women, and vegetarians.)  Routine UA:  18-Jan-16 15:21   Color (UA) Yellow  Clarity (UA) Clear  Glucose (UA) Negative  Bilirubin (UA) Negative  Ketones (UA) Negative  Specific Gravity (UA) 1.017  Blood (UA) Negative  pH (UA) 5.0  Protein (UA) Negative  Nitrite (UA) Negative  Leukocyte Esterase (UA) Negative (Result(s) reported on 29 Jun 2014 at 04:40PM.)  RBC (UA) NONE SEEN  WBC (UA) <1 /HPF  Bacteria (UA) NONE SEEN  Epithelial Cells (UA) NONE SEEN  Mucous (UA) PRESENT (Result(s) reported on 29 Jun 2014 at 04:40PM.)  Routine Coag:  18-Jan-16 15:21   Prothrombin 12.7  INR 1.0 (INR reference interval applies to patients on anticoagulant therapy. A single INR therapeutic range for coumarins is not optimal for all indications; however, the suggested range for most indications is 2.0 - 3.0. Exceptions to the INR Reference Range may include: Prosthetic heart valves, acute myocardial infarction, prevention of myocardial infarction, and combinations of aspirin and anticoagulant. The need for a higher or lower target INR must be assessed individually. Reference: The Pharmacology and Management of the Vitamin K  antagonists: the seventh ACCP Conference on Antithrombotic and Thrombolytic Therapy. UTMLY.6503 Sept:126 (3suppl): [id number]. A HCT value >55% may artifactually increase the PT.  In one study,  the increase was an average of 25%. Reference:  "Effect on Routine and Special Coagulation Testing Values of Citrate Anticoagulant Adjustment in Patients with High HCT Values." American Journal of Clinical Pathology 2006;126:400-405.)  Activated PTT (APTT)  < 23.0 (A HCT value >55% may artifactually increase the APTT. In one study, the increase was an average of 19%. Reference: "Effect on Routine and Special Coagulation Testing Values of Citrate Anticoagulant Adjustment  in Patients with High HCT Values." American Journal of Clinical Pathology 2006;126:400-405.)  Routine Hem:  18-Jan-16 15:21   Platelet Count (CBC)  104  WBC (CBC) 7.9  RBC (CBC) 4.76  Hemoglobin (CBC) 15.5  Hematocrit (CBC) 45.9  MCV 96  MCH 32.6  MCHC 33.8  RDW  14.9  Neutrophil % 88.1  Lymphocyte % 7.0  Monocyte % 4.3  Eosinophil % 0.4  Basophil % 0.2  Neutrophil #  6.9  Lymphocyte #  0.6  Monocyte # 0.3  Eosinophil # 0.0  Basophil # 0.0 (Result(s) reported on 29 Jun 2014 at 04:42PM.)   Review Pathology Report: Pathology Reports:   Pathology Report:  (08-Jul-2014) PRE-OPERATIVE DIAGNOSIS:mass, presumed ovarian cancer with brain mets  DIAGNOSIS:provided; laparoscopic total hysterectomy, bilateral     RIGHT ADNEXA; REMOVAL: ? HIGH-GRADE SEROUS CARCINOMA INVOLVING PARAOVARIAN TISSUE. ? RIGHT OVARY NOT INVOLVED. ? RIGHT FALLOPIAN TUBE NOT IDENTIFIED. UTERUS WITH CERVIX; HYSTERECTOMY: ? MYOMETRIUM INVOLVED BY METASTATIC HIGH-GRADE SEROUS CARCINOMA. ? ATROPHIC ENDOMETRIUM, NOT INVOLVED. ? CERVIX WITH SQUAMOUS ATROPHY, NOT INVOLVED. FALLOPIAN TUBE; SALPINGECTOMY: ? SEROUS TUBAL INTRAEPITHELIAL CARCINOMA (STIC), SINGLE 2 MM FOCUSFIMBRIA. OVARY; OOPHORECTOMY: ? NOT INVOLVED.    The cystic and solid right adnexalmass is separate from theand there is no identifiable fallopian tube, so the tumor mayobliterated it. The primary site cannot be specified with certaintyright fallopian tube is favored. There are no areas in the cyst toa precursor lesion or neoplasm.TUBE: Unilateral Salpingectomy, Salpingo-oophorectomy,  orwith Salpingo-oophorectomyTube, Unilateral Salpingectomy, Salpingo-oophorectomy, orwith Salpingo-oophorectomy Cancer Case SummaryRight fallopian tubefallopian tubeovaryovary    Hysterectomy with salpingo-oophorectomyNode Sampling:     No lymph nodes submitted or foundSite:    Right fallopian tubeto Ovary:   Not fusedof Fimbriated End:     Cannot be  determinedfallopian tubeto Ovary:   Not fusedof Fimbriated End:     OpenLocation:     Cannot be determinedtube obliterated; left tube fimbriaIntegritySpecimen Integrity:   Right-sided specimen(s)Integrity (Right):   Rupturedspecimen(s)Integrity (Left):    IntactType:    Serous carcinomaGrade:   G3: Poorly differentiatedSize:    Cannot be determined (explain)least 2.5 cm solid tumor involving ruptured larger cyst, at least 6FINDINGSInvasion: PresentNODESapplicable(pTNM [FIGO])Tumor (pT): pT2a [IIA]: Extension and / or metastasis to theand / or ovariesLymph Nodes (pN)Cannot be assessednodes submitted or foundMetastasis (pM): Not applicable       Assessment and Plan: Impression:   Stage IV ovarian cancer with brain metastasis, weakness and fatigue. Plan:   1. Ovarian cancer: Pathology results as above. Patient will require adjuvant chemotherapy with carboplatinum and Taxol when she is fully recovered from her surgery. She has now completed XRT to the metastatic lesions in her brain. Return to clinic on July 23, 2014 for treatment planning.Weakness and fatigue: Patient performance status has declined and both her and her family feel it is unsafe for her to go home. It is reasonable to monitor patient one additional night and discharge tomorrow with home health. consult, call with questions.  Fax to Physician:  [facility] Fax: Ronette Deter, MD - [phone].  Electronic Signatures: Delight Hoh (MD)  (Signed 28-Jan-16 17:32)  Authored: Note Type, CC/HPI, Review of Systems, ALLERGIES, Patient Family Social History, HOME MEDICATIONS, Vital Signs, Physical Exam, Lab Results Review, Pathology Report Review, Assessment and Plan, Fax to Physician   Last Updated: 28-Jan-16 17:32 by Delight Hoh (MD)

## 2014-10-13 ENCOUNTER — Ambulatory Visit: Payer: Medicare Other | Attending: Oncology | Admitting: Physical Therapy

## 2014-10-13 ENCOUNTER — Inpatient Hospital Stay: Payer: Medicare Other | Attending: Oncology

## 2014-10-13 DIAGNOSIS — R531 Weakness: Secondary | ICD-10-CM | POA: Diagnosis not present

## 2014-10-13 DIAGNOSIS — Z87891 Personal history of nicotine dependence: Secondary | ICD-10-CM | POA: Insufficient documentation

## 2014-10-13 DIAGNOSIS — K589 Irritable bowel syndrome without diarrhea: Secondary | ICD-10-CM | POA: Insufficient documentation

## 2014-10-13 DIAGNOSIS — D6959 Other secondary thrombocytopenia: Secondary | ICD-10-CM | POA: Diagnosis not present

## 2014-10-13 DIAGNOSIS — T451X5S Adverse effect of antineoplastic and immunosuppressive drugs, sequela: Secondary | ICD-10-CM | POA: Insufficient documentation

## 2014-10-13 DIAGNOSIS — C7931 Secondary malignant neoplasm of brain: Secondary | ICD-10-CM | POA: Diagnosis not present

## 2014-10-13 DIAGNOSIS — R5383 Other fatigue: Secondary | ICD-10-CM | POA: Diagnosis not present

## 2014-10-13 DIAGNOSIS — Z79899 Other long term (current) drug therapy: Secondary | ICD-10-CM | POA: Insufficient documentation

## 2014-10-13 DIAGNOSIS — D701 Agranulocytosis secondary to cancer chemotherapy: Secondary | ICD-10-CM | POA: Insufficient documentation

## 2014-10-13 DIAGNOSIS — R600 Localized edema: Secondary | ICD-10-CM | POA: Insufficient documentation

## 2014-10-13 DIAGNOSIS — Z5111 Encounter for antineoplastic chemotherapy: Secondary | ICD-10-CM | POA: Insufficient documentation

## 2014-10-13 DIAGNOSIS — D693 Immune thrombocytopenic purpura: Secondary | ICD-10-CM | POA: Insufficient documentation

## 2014-10-13 DIAGNOSIS — M810 Age-related osteoporosis without current pathological fracture: Secondary | ICD-10-CM | POA: Insufficient documentation

## 2014-10-13 DIAGNOSIS — J449 Chronic obstructive pulmonary disease, unspecified: Secondary | ICD-10-CM | POA: Insufficient documentation

## 2014-10-13 DIAGNOSIS — I890 Lymphedema, not elsewhere classified: Secondary | ICD-10-CM | POA: Insufficient documentation

## 2014-10-13 DIAGNOSIS — Z7982 Long term (current) use of aspirin: Secondary | ICD-10-CM | POA: Diagnosis not present

## 2014-10-13 DIAGNOSIS — C569 Malignant neoplasm of unspecified ovary: Secondary | ICD-10-CM

## 2014-10-13 DIAGNOSIS — K219 Gastro-esophageal reflux disease without esophagitis: Secondary | ICD-10-CM | POA: Insufficient documentation

## 2014-10-13 LAB — MAGNESIUM: Magnesium: 2.2 mg/dL (ref 1.7–2.4)

## 2014-10-13 LAB — COMPREHENSIVE METABOLIC PANEL
ALBUMIN: 3.5 g/dL (ref 3.5–5.0)
ALT: 12 U/L — ABNORMAL LOW (ref 14–54)
AST: 22 U/L (ref 15–41)
Alkaline Phosphatase: 48 U/L (ref 38–126)
Anion gap: 3 — ABNORMAL LOW (ref 5–15)
BILIRUBIN TOTAL: 0.7 mg/dL (ref 0.3–1.2)
BUN: 20 mg/dL (ref 6–20)
CO2: 25 mmol/L (ref 22–32)
Calcium: 9.3 mg/dL (ref 8.9–10.3)
Chloride: 109 mmol/L (ref 101–111)
Creatinine, Ser: 0.78 mg/dL (ref 0.44–1.00)
GFR calc Af Amer: >60 mL/min (ref >60)
GFR calc non Af Amer: >60 mL/min (ref >60)
Glucose, Bld: 97 mg/dL (ref 65–99)
POTASSIUM: 4.7 mmol/L (ref 3.5–5.1)
Sodium: 137 mmol/L (ref 135–145)
Total Protein: 6.2 g/dL — ABNORMAL LOW (ref 6.5–8.1)

## 2014-10-13 LAB — CBC WITH DIFFERENTIAL/PLATELET
Basophils Absolute: 0 10*3/uL (ref 0–0.1)
EOS ABS: 0 10*3/uL (ref 0–0.7)
Eosinophils Relative: 0 %
HCT: 35.6 % (ref 35.0–47.0)
Hemoglobin: 12.3 g/dL (ref 12.0–16.0)
Lymphocytes Relative: 46 %
Lymphs Abs: 1.2 10*3/uL (ref 1.0–3.6)
MCH: 35.4 pg — AB (ref 26.0–34.0)
MCHC: 34.6 g/dL (ref 32.0–36.0)
MCV: 102.5 fL — ABNORMAL HIGH (ref 80.0–100.0)
Monocytes Absolute: 0.3 10*3/uL (ref 0.2–0.9)
NEUTROS ABS: 1 10*3/uL — AB (ref 1.4–6.5)
Neutrophils Relative %: 40 %
PLATELETS: 120 10*3/uL — AB (ref 150–440)
RBC: 3.48 MIL/uL — ABNORMAL LOW (ref 3.80–5.20)
RDW: 17.8 % — ABNORMAL HIGH (ref 11.5–14.5)
WBC: 2.5 10*3/uL — ABNORMAL LOW (ref 3.6–11.0)

## 2014-10-13 NOTE — Therapy (Signed)
[place], [place], [address] Phone: [phone]   Fax:  [phone]  Physical Therapy Treatment  Patient Details  Name: Amanda Robbins MRN: [id number] Date of Birth: 01-11-1941 Referring Provider:  Jackolyn Confer, MD  Encounter Date: 10/13/2014      PT End of Session - 10/13/14 1346    Visit Number 3   Number of Visits 9   Date for PT Re-Evaluation 11/10/14   PT Start Time 5003   PT Stop Time 1346   PT Time Calculation (min) 43 min      Past Medical History  Diagnosis Date  . 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 Irritable bowel   . C. difficile colitis 2011  . Allergic rhinitis     Followed by Dr. Donneta Romberg  . Esophageal reflux     Followed by GI physician, on Dexilant and Ranitidine  . Migraine   . 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 Osteoporosis 2009    on Reclast once yearly, Followed by Dr. Jefm Bryant    Past Surgical History  Procedure Laterality Date  . Bil roken wrists    . Gallbladder removed    . Bilateral bunionectomies    . Cesarean section      There were no vitals filed for this visit.  Visit Diagnosis:  Lymphedema of both lower extremities      Subjective Assessment - 10/13/14 1311    Subjective pt states she has been more acitve this past week doing things that need to be done.  She has gotten 2 pair of athletic pants that she wears for compression of thighs and feels that is they are helping. She brought in home exercise sheet for [place] to review and they all will help with lypmhedema management as well    Pertinent History ovarian cancer with metastasis to the brain December 2015, 10 radiation treatments to brain to treat mets.  Total abdominal  hysterectomy, currently undergoing chemotherapy.  Developed swelling in thighs, lower legs and ankles.  she has had home health therapy at home for mobility, she does ace wrap her ankles every.  day.  she  says she has numbness or tingling in her toes that she has had for a long time    She reports she had a PE in [place]    Patient Stated Goals to get rid of the swelling,    Currently in Pain? No/denies                         [facility] Adult PT Treatment/Exercise - 10/13/14 0001    Manual Therapy   Manual Lymphatic Drainage (MLD) In Supine: Short neck, RT axillary nodes, superficial and deep abdominals, RT lower quadrant, Rt inguino-axillary anastomosis and Rt LE from dorsal foot to lateral thigh, then same on Lt, insturction in basic principle of manual lymph drainage. with patient doing return demonstration.                PT Education - 10/13/14 1345    Education provided Yes   Education Details self manual lymph drainage for lower extremities    Person(s) Educated Patient   Methods Explanation;Demonstration   Comprehension Verbalized understanding;Returned demonstration;Need further instruction           Short Term Clinic Goals - 10/06/14 1757    CC Short Term Goal  #1   Title short term goals= long  term goals              Long Term Clinic Goals - 10/13/14 1659    CC Long Term Goal  #1   Title Patient will verbalize understanding of lymphedema risk reduction practices   Time 4   Status On-going   CC Long Term Goal  #2   Title Patient will have a circumferential reduction of     2    cm at     20     cm above the   right suprapatella    Status On-going   CC Long Term Goal  #3   Title Patient will be know how to obtain and use compression garments for maintenance phase of treatment   Status On-going   CC Long Term Goal  #4   Title Patient will be independent in self manual lymph drainage   Status On-going            Plan - 10/13/14 1349    Clinical Impression Statement Pt brought in exercises from Home PT. They were reviewed and are appropriate for patient to continue on her own for lymph  drianage also.   Clinical  Impairments Affecting Rehab Potential ongoing chemotherapy every 4 weeks   PT Next Visit Plan Continue Manual lymph draianage to both lower extremities, review self management techniques and issue handout if she wants one.    Consulted and Agree with Plan of Care Patient        Problem List Patient Active Problem List   Diagnosis Date Noted  . Ovarian cancer 08/31/2014  . 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 Brain metastasis 06/01/2014  . Abdominal cramping 02/03/2014  . Dizzinesses 05/26/2013  . Medicare annual wellness visit, subsequent 11/22/2011  . GERD (gastroesophageal reflux disease) 08/22/2011  . Neuropathy 05/24/2011  . Insomnia 05/24/2011  . Osteoporosis 05/24/2011  . IBS (irritable bowel syndrome) 05/24/2011  . Migraine 05/24/2011  . Thrombocytopenia 05/24/2011  . Hyperlipidemia 05/24/2011   Donato Heinz. Owens Shark, PT  10/13/2014, 5:00 PM  [place], [place], [address] Phone: [phone]   Fax:  [phone]

## 2014-10-15 ENCOUNTER — Ambulatory Visit: Payer: Medicare Other

## 2014-10-19 ENCOUNTER — Encounter: Payer: Self-pay | Admitting: Physical Therapy

## 2014-10-19 ENCOUNTER — Ambulatory Visit: Payer: Medicare Other | Admitting: Physical Therapy

## 2014-10-19 DIAGNOSIS — I890 Lymphedema, not elsewhere classified: Secondary | ICD-10-CM

## 2014-10-19 NOTE — Therapy (Signed)
[place], [place], [address] Phone: [phone]   Fax:  [phone]  Physical Therapy Treatment  Patient Details  Name: Amanda Robbins MRN: [id number] Date of Birth: 1940-12-12 Referring Provider:  Jackolyn Confer, MD  Encounter Date: 10/19/2014      PT End of Session - 10/19/14 1442    Visit Number 4   Number of Visits 9   Date for PT Re-Evaluation 11/10/14   PT Start Time 7902   PT Stop Time 1440   PT Time Calculation (min) 55 min   Activity Tolerance Patient tolerated treatment well   Behavior During Therapy [facility] for tasks assessed/performed      Past Medical History  Diagnosis Date  . 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 Irritable bowel   . C. difficile colitis 2011  . Allergic rhinitis     Followed by Dr. Donneta Romberg  . Esophageal reflux     Followed by GI physician, on Dexilant and Ranitidine  . Migraine   . 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 Osteoporosis 2009    on Reclast once yearly, Followed by Dr. Jefm Bryant    Past Surgical History  Procedure Laterality Date  . Bil roken wrists    . Gallbladder removed    . Bilateral bunionectomies    . Cesarean section      There were no vitals filed for this visit.  Visit Diagnosis:  Lymphedema of both lower extremities      Subjective Assessment - 10/19/14 1350    Subjective Still wearing compression shorts and stockings and wrap my ankles sometimes.  That helps me be able to walk.  It seems like the treatment is helping some but I'm still swollen in my legs and genital area.   Pertinent History ovarian cancer with metastasis to the brain December 2015, 10 radiation treatments to brain to treat mets.  Total abdominal  hysterectomy, currently undergoing chemotherapy.  Developed swelling in thighs, lower legs and ankles.  she has had home health therapy at home for mobility, she does ace wrap her ankles  every. day.  she  says she has numbness or tingling in her toes that she has had for a long time    She reports she had a PE in [place]    Patient Stated Goals to get rid of the swelling,    Currently in Pain? No/denies                         [facility] Adult PT Treatment/Exercise - 10/19/14 0001    Manual Therapy   Manual Lymphatic Drainage (MLD) In Supine with HOB elevated: Short neck, RT axillary nodes, superficial and deep abdominals, RT lower quadrant, Rt inguino-axillary anastomosis and Rt LE from dorsal foot to lateral thigh, then same on Lt, reviewing basic principles of manual lymph drainage.                PT Education - 10/19/14 1441    Education provided Yes   Education Details Manual lymph drainage both legs   Person(s) Educated Patient   Methods Explanation;Demonstration;Handout   Comprehension Verbalized understanding           Short Term Clinic Goals - 10/06/14 1757    CC Short Term Goal  #1   Title short term goals= long term goals  [facility] Goals - 10/13/14 1659    CC Long Term Goal  #1   Title Patient will verbalize understanding of lymphedema risk reduction practices   Time 4   Status On-going   CC Long Term Goal  #2   Title Patient will have a circumferential reduction of     2    cm at     20     cm above the   right suprapatella    Status On-going   CC Long Term Goal  #3   Title Patient will be know how to obtain and use compression garments for maintenance phase of treatment   Status On-going   CC Long Term Goal  #4   Title Patient will be independent in self manual lymph drainage   Status On-going            Plan - 10/19/14 1443    Clinical Impression Statement Patient continues to have swelling present in her bilateral LE.  Thighs appear to be soft and not fibrotic.  Her calves and feet/ankles are more fibrotic and skin has less mobility with manual lymph drainage.  Her overall mobility is  limited by her ankles and size of her legs but she feels she is progressing with treatment.  She will benefit from continued PT to try to further reduce her swelling.  Handouts of doing manual lymph drainage were issued today and she plans to do that with her husband's help.   Pt will benefit from skilled therapeutic intervention in order to improve on the following deficits Decreased strength;Decreased knowledge of precautions;Decreased knowledge of use of DME;Difficulty walking;Increased edema;Decreased scar mobility;Decreased endurance;Increased fascial restricitons   Clinical Impairments Affecting Rehab Potential ongoing chemotherapy every 4 weeks   PT Frequency 2x / week   PT Duration 4 weeks   PT Treatment/Interventions ADLs/Self Care Home Management;Therapeutic exercise;Compression bandaging;Scar mobilization;DME Instruction;Patient/family education;Manual techniques;Manual lymph drainage;Therapeutic activities   PT Next Visit Plan Continue Manual lymph draianage to both lower extremities, measure both legs for reassessment.   Consulted and Agree with Plan of Care Patient        Problem List Patient Active Problem List   Diagnosis Date Noted  . Ovarian cancer 08/31/2014  . 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 Brain metastasis 06/01/2014  . Abdominal cramping 02/03/2014  . Dizzinesses 05/26/2013  . Medicare annual wellness visit, subsequent 11/22/2011  . GERD (gastroesophageal reflux disease) 08/22/2011  . Neuropathy 05/24/2011  . Insomnia 05/24/2011  . Osteoporosis 05/24/2011  . IBS (irritable bowel syndrome) 05/24/2011  . Migraine 05/24/2011  . Thrombocytopenia 05/24/2011  . Hyperlipidemia 05/24/2011    Annia Friendly, PT 10/19/2014, 2:50 PM  [place], [place], [address] Phone: [phone]   Fax:  [phone]

## 2014-10-19 NOTE — Patient Instructions (Signed)
Issued handout for performing manual lymph drainage on her legs.  She verbalized good understanding of instructions below: 1. Circle technique over collar bones on both sides - 5x's.   2. Circle technique of right groin - 5x's.  Circle technique of right armpit - 5x's.  3. Pump technique of right outer thigh from knee to groin - 5x's.  4. Circle technique of right inner thigh to outer thigh - 5x's.  5. Pump technique of  lower leg from ankle to knee, front and back side - 5x's each side.   6. Repeat step 3 and 4.   7. Circle technique of top of right foot from toes to ankle - 5x's.  8. Pump technique of right leg from ankle to groin - 2-3x's.  9. Repeat step 3 then step 2.  Repeat sequence with the other leg.  Repeat the above techniques once daily.

## 2014-10-22 ENCOUNTER — Ambulatory Visit: Payer: Medicare Other | Admitting: Physical Therapy

## 2014-10-22 DIAGNOSIS — I890 Lymphedema, not elsewhere classified: Secondary | ICD-10-CM

## 2014-10-22 NOTE — Therapy (Signed)
[place], [place], [address] Phone: [phone]   Fax:  [phone]  Physical Therapy Treatment  Patient Details  Name: Amanda Robbins MRN: [id number] Date of Birth: 1941-06-05 Referring Provider:  Jackolyn Confer, MD  Encounter Date: 10/22/2014      PT End of Session - 10/22/14 1119    Visit Number 5   PT Start Time 8502   PT Stop Time 1100   PT Time Calculation (min) 45 min      Past Medical History  Diagnosis Date  . 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 Irritable bowel   . C. difficile colitis 2011  . Allergic rhinitis     Followed by Dr. Donneta Romberg  . Esophageal reflux     Followed by GI physician, on Dexilant and Ranitidine  . Migraine   . 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 Osteoporosis 2009    on Reclast once yearly, Followed by Dr. Jefm Bryant    Past Surgical History  Procedure Laterality Date  . Bil roken wrists    . Gallbladder removed    . Bilateral bunionectomies    . Cesarean section      There were no vitals filed for this visit.  Visit Diagnosis:  Lymphedema of both lower extremities      Subjective Assessment - 10/22/14 1108    Subjective pt reports she s wearing compression pants, exercising and wrapping ankles. She states her thighs feel much softer, but she feels fullness up high between her legs.  She conitnues to have a strong fear of falling.    Pertinent History ovarian cancer with metastasis to the brain December 2015, 10 radiation treatments to brain to treat mets.  Total abdominal  hysterectomy, currently undergoing chemotherapy.  Developed swelling in thighs, lower legs and ankles.  she has had home health therapy at home for mobility, she does ace wrap her ankles every. day.  she  says she has numbness or tingling in her toes that she has had for a long time    She reports she had a PE in [place]    Currently in Pain?  No/denies               LYMPHEDEMA/ONCOLOGY QUESTIONNAIRE - 10/22/14 1024    Right Lower Extremity Lymphedema   30 cm Proximal to Suprapatella 57.5 cm   20 cm Proximal to Suprapatella 55 cm   10 cm Proximal to Suprapatella 47 cm   At Midpatella/Popliteal Crease 36.5 cm  crease   30 cm Proximal to Floor at Lateral Plantar Foot 35.1 cm   20 cm Proximal to Floor at Lateral Plantar Foot 29.2 1   10  cm Proximal to Floor at Lateral Malleoli 21.1 cm   5 cm Proximal to 1st MTP Joint 21.4 cm   Around Proximal Great Toe 6.9 cm   Left Lower Extremity Lymphedema   30 cm Proximal to Suprapatella 55.8 cm   20 cm Proximal to Suprapatella 53.6 cm   10 cm Proximal to Suprapatella 45 cm   At Midpatella/Popliteal Crease 34.9 cm  crease   30 cm Proximal to Floor at Lateral Plantar Foot 34 cm   20 cm Proximal to Floor at Lateral Plantar Foot 28.5 cm   10 cm Proximal to Floor at Lateral Malleoli 21.5 cm   5 cm Proximal to 1st MTP Joint 20.8 cm   Around Proximal Great Toe 6.9 cm  [facility] Adult PT Treatment/Exercise - 10/22/14 0001    Manual Therapy   Manual Therapy Manual Lymphatic Drainage (MLD);Other (comment)   Manual Lymphatic Drainage (MLD) In Supine with HOB elevated: Short neck, RT axillary nodes, superficial and deep abdominals, RT lower quadrant, Rt inguino-axillary anastomosis and Rt LE from dorsal foot to lateral thigh, did not have time to perform treatment on left leg.    Other Manual Therapy provided patient with 2 large pieces of 1/4 foam in large tg soft.to wear at proximal medial thighs under running pants.,                PT Education - 10/22/14 1119    Education provided Yes   Education Details Reviewed self manual lymph drainage sequence   Person(s) Educated Patient   Methods Explanation;Verbal cues   Comprehension Verbalized understanding           Short Term Clinic Goals - 10/06/14 1757    CC Short Term Goal  #1   Title short  term goals= long term goals              Long Term Clinic Goals - 10/22/14 1123    CC Long Term Goal  #1   Title Patient will verbalize understanding of lymphedema risk reduction practices   Status On-going   CC Long Term Goal  #2   Title Patient will have a circumferential reduction of     2    cm at     20     cm above the   right suprapatella   measured at 55 cm 10/22/2014   Baseline 57   Period Weeks   Status Achieved   CC Long Term Goal  #3   Title Patient will be know how to obtain and use compression garments for maintenance phase of treatment   Status On-going   CC Long Term Goal  #4   Title Patient will be independent in self manual lymph drainage   Status On-going            Plan - 10/22/14 1120    Clinical Impression Statement Remeasured legs and there has been decreased in circumferential measurement of both legs, except for most proximal measurement which showed increases.  Upper legs much softer.  She appears to be making progress toward self management.  she will miss next week due to chemo treatment and feel she may  be ready to decrease to one time per week after that    PT Next Visit Plan Continue Manual lymph draianage to both lower extremities, measure both legs for reassessment. Review lymphedema risk reduction practices to assess for goal achievement        Problem List Patient Active Problem List   Diagnosis Date Noted  . Ovarian cancer 08/31/2014  . 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 Brain metastasis 06/01/2014  . Abdominal cramping 02/03/2014  . Dizzinesses 05/26/2013  . Medicare annual wellness visit, subsequent 11/22/2011  . GERD (gastroesophageal reflux disease) 08/22/2011  . Neuropathy 05/24/2011  . Insomnia 05/24/2011  . Osteoporosis 05/24/2011  . IBS (irritable bowel syndrome) 05/24/2011  . Migraine 05/24/2011  . Thrombocytopenia 05/24/2011  . Hyperlipidemia 05/24/2011   Donato Heinz. Owens Shark, PT  10/22/2014, 11:25  AM  [place], [place], [address] Phone: [phone]   Fax:  [phone]

## 2014-10-22 NOTE — Patient Instructions (Signed)
Try compression at medial upper thighs but watch for increased swelling on lower thighs or down into leg

## 2014-10-25 ENCOUNTER — Other Ambulatory Visit: Payer: Self-pay | Admitting: Oncology

## 2014-10-26 ENCOUNTER — Ambulatory Visit: Payer: Medicare Other | Admitting: Physical Therapy

## 2014-10-26 DIAGNOSIS — I890 Lymphedema, not elsewhere classified: Secondary | ICD-10-CM

## 2014-10-26 NOTE — Therapy (Signed)
[place], [place], [address] Phone: [phone]   Fax:  [phone]  Physical Therapy Treatment  Patient Details  Name: Amanda Robbins MRN: [id number] Date of Birth: 12-Dec-1940 Referring Provider:  Lloyd Huger, MD  Encounter Date: 10/26/2014    Past Medical History  Diagnosis Date  . 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 Irritable bowel   . C. difficile colitis 2011  . Allergic rhinitis     Followed by Dr. Donneta Romberg  . Esophageal reflux     Followed by GI physician, on Dexilant and Ranitidine  . Migraine   . 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 Osteoporosis 2009    on Reclast once yearly, Followed by Dr. Jefm Bryant    Past Surgical History  Procedure Laterality Date  . Bil roken wrists    . Gallbladder removed    . Bilateral bunionectomies    . Cesarean section      There were no vitals filed for this visit.  Visit Diagnosis:  Lymphedema of both lower extremities      Subjective Assessment - 10/26/14 1033    Subjective pt comes in with the ace wraps she has been wearing on her ankles with socks over top, compression foam at thighs that she has been wearing. She has been alternating with athletic pants and support panty hose  and TED hose from hospital She has been doing her self manual lymph drainage   Pertinent History ovarian cancer with metastasis to the brain December 2015, 10 radiation treatments to brain to treat mets.  Total abdominal  hysterectomy, currently undergoing chemotherapy.  Developed swelling in thighs, lower legs and ankles.  she has had home health therapy at home for mobility, she does ace wrap her ankles every. day.  she  says she has numbness or tingling in her toes that she has had for a long time    She reports she had a PE in [place]    Patient Stated Goals to get rid of the swelling,    Currently in Pain? Yes   Pain Score 4    Pain Location Ankle   Pain Orientation Right;Left  worse on left    Pain Descriptors / Indicators Tingling   Pain Onset More than a month ago                         [facility] Adult PT Treatment/Exercise - 10/26/14 0001    Manual Therapy   Manual Therapy Manual Lymphatic Drainage (MLD);Other (comment)   Manual Lymphatic Drainage (MLD) In Supine with HOB elevated: Short neck, RT axillary nodes, superficial and deep abdominals, RT lower quadrant, Rt inguino-axillary anastomosis and Rt LE from dorsal foot to lateral thigh,same on left leg. Watched pt perform self technique and provided feedback to slow down and lighten pressure     Other Manual Therapy provided patient with 2 large pieces of 1/4 foam in large tg soft.to wear at proximal medial thighs under running pants.,                PT Education - 10/26/14 1114    Education provided Yes   Education Details Rviewed self techniques, do not use ace wrap clips , use masking tape to secure ankle wraps   Methods Tactile cues;Verbal cues;Explanation   Comprehension Verbalized understanding           Short Term Clinic Goals -  10/06/14 1757    CC Short Term Goal  #1   Title short term goals= long term goals              Long Term Clinic Goals - 10/26/14 1116    CC Long Term Goal  #1   Title Patient will verbalize understanding of lymphedema risk reduction practices   Status On-going   CC Long Term Goal  #2   Title Patient will have a circumferential reduction of     2    cm at     20     cm above the   right suprapatella    Status Achieved   CC Long Term Goal  #3   Title Patient will be know how to obtain and use compression garments for maintenance phase of treatment   Status On-going   CC Long Term Goal  #4   Title Patient will be independent in self manual lymph drainage   Status On-going            Plan - 10/26/14 1116    Clinical Impression Statement Pt is using compession as needed, but  needed cues for technique of self manual lymph drainage.  She continues to have lymphedema at anterior lower abdomen. Provided 1/4 " foam in thick stockinette and talked about using jockey "skimmies" or other compression bike short type of underwear to hold in place.  Pt to have chemo tomorrow so will miss next week.     PT Next Visit Plan Continue Manual lymph draianage to both lower extremities, measure both legs for reassessment. Review lymphedema risk reduction practices to assess for goal achievement   Consulted and Agree with Plan of Care Patient        Problem List Patient Active Problem List   Diagnosis Date Noted  . Ovarian cancer 08/31/2014  . 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 Brain metastasis 06/01/2014  . Abdominal cramping 02/03/2014  . Dizzinesses 05/26/2013  . Medicare annual wellness visit, subsequent 11/22/2011  . GERD (gastroesophageal reflux disease) 08/22/2011  . Neuropathy 05/24/2011  . Insomnia 05/24/2011  . Osteoporosis 05/24/2011  . IBS (irritable bowel syndrome) 05/24/2011  . Migraine 05/24/2011  . Thrombocytopenia 05/24/2011  . Hyperlipidemia 05/24/2011   Donato Heinz. Owens Shark, PT  10/26/2014, 11:20 AM  [place], [place], [address] Phone: [phone]   Fax:  [phone]

## 2014-10-27 ENCOUNTER — Inpatient Hospital Stay: Payer: Medicare Other

## 2014-10-27 ENCOUNTER — Encounter: Payer: Self-pay | Admitting: Oncology

## 2014-10-27 ENCOUNTER — Inpatient Hospital Stay (HOSPITAL_BASED_OUTPATIENT_CLINIC_OR_DEPARTMENT_OTHER): Payer: Medicare Other | Admitting: Oncology

## 2014-10-27 VITALS — BP 103/69 | HR 86 | Temp 98.3°F | Resp 16 | Wt 134.7 lb

## 2014-10-27 DIAGNOSIS — D693 Immune thrombocytopenic purpura: Secondary | ICD-10-CM

## 2014-10-27 DIAGNOSIS — D6959 Other secondary thrombocytopenia: Secondary | ICD-10-CM | POA: Diagnosis not present

## 2014-10-27 DIAGNOSIS — R531 Weakness: Secondary | ICD-10-CM

## 2014-10-27 DIAGNOSIS — T451X5S Adverse effect of antineoplastic and immunosuppressive drugs, sequela: Secondary | ICD-10-CM

## 2014-10-27 DIAGNOSIS — C7931 Secondary malignant neoplasm of brain: Secondary | ICD-10-CM

## 2014-10-27 DIAGNOSIS — C569 Malignant neoplasm of unspecified ovary: Secondary | ICD-10-CM

## 2014-10-27 DIAGNOSIS — D701 Agranulocytosis secondary to cancer chemotherapy: Secondary | ICD-10-CM

## 2014-10-27 DIAGNOSIS — M810 Age-related osteoporosis without current pathological fracture: Secondary | ICD-10-CM

## 2014-10-27 DIAGNOSIS — R5383 Other fatigue: Secondary | ICD-10-CM

## 2014-10-27 DIAGNOSIS — R600 Localized edema: Secondary | ICD-10-CM

## 2014-10-27 DIAGNOSIS — J449 Chronic obstructive pulmonary disease, unspecified: Secondary | ICD-10-CM

## 2014-10-27 DIAGNOSIS — Z79899 Other long term (current) drug therapy: Secondary | ICD-10-CM

## 2014-10-27 DIAGNOSIS — Z5111 Encounter for antineoplastic chemotherapy: Secondary | ICD-10-CM | POA: Diagnosis not present

## 2014-10-27 LAB — CBC WITH DIFFERENTIAL/PLATELET
Basophils Absolute: 0 10*3/uL (ref 0–0.1)
Basophils Relative: 1 %
EOS ABS: 0 10*3/uL (ref 0–0.7)
HEMATOCRIT: 34.4 % — AB (ref 35.0–47.0)
Hemoglobin: 11.6 g/dL — ABNORMAL LOW (ref 12.0–16.0)
LYMPHS ABS: 0.9 10*3/uL — AB (ref 1.0–3.6)
MCH: 35 pg — AB (ref 26.0–34.0)
MCHC: 33.7 g/dL (ref 32.0–36.0)
MCV: 103.8 fL — ABNORMAL HIGH (ref 80.0–100.0)
MONO ABS: 0.2 10*3/uL (ref 0.2–0.9)
Neutro Abs: 1.6 10*3/uL (ref 1.4–6.5)
Neutrophils Relative %: 57 %
Platelets: 77 10*3/uL — ABNORMAL LOW (ref 150–440)
RBC: 3.31 MIL/uL — AB (ref 3.80–5.20)
RDW: 17.2 % — ABNORMAL HIGH (ref 11.5–14.5)
WBC: 2.7 10*3/uL — AB (ref 3.6–11.0)

## 2014-10-27 LAB — BASIC METABOLIC PANEL
ANION GAP: 5 (ref 5–15)
BUN: 20 mg/dL (ref 6–20)
CALCIUM: 9.1 mg/dL (ref 8.9–10.3)
CO2: 23 mmol/L (ref 22–32)
CREATININE: 0.82 mg/dL (ref 0.44–1.00)
Chloride: 109 mmol/L (ref 101–111)
GFR calc Af Amer: >60 mL/min (ref >60)
GFR calc non Af Amer: >60 mL/min (ref >60)
Glucose, Bld: 114 mg/dL — ABNORMAL HIGH (ref 65–99)
Potassium: 3.8 mmol/L (ref 3.5–5.1)
Sodium: 137 mmol/L (ref 135–145)

## 2014-10-27 MED ORDER — CARBOPLATIN CHEMO INJECTION 450 MG/45ML
301.2000 mg | Freq: Once | INTRAVENOUS | Status: AC
  Administered 2014-10-27: 300 mg via INTRAVENOUS
  Filled 2014-10-27: qty 30

## 2014-10-27 MED ORDER — DEXAMETHASONE SODIUM PHOSPHATE 100 MG/10ML IJ SOLN
Freq: Once | INTRAMUSCULAR | Status: AC
  Administered 2014-10-27: 13:00:00 via INTRAVENOUS
  Filled 2014-10-27: qty 8

## 2014-10-27 MED ORDER — SODIUM CHLORIDE 0.9 % IV SOLN
Freq: Once | INTRAVENOUS | Status: AC
  Administered 2014-10-27: 12:00:00 via INTRAVENOUS
  Filled 2014-10-27: qty 250

## 2014-10-27 MED ORDER — HEPARIN SOD (PORK) LOCK FLUSH 100 UNIT/ML IV SOLN
500.0000 [IU] | Freq: Once | INTRAVENOUS | Status: AC | PRN
  Administered 2014-10-27: 500 [IU]
  Filled 2014-10-27: qty 5

## 2014-10-27 MED ORDER — SODIUM CHLORIDE 0.9 % IJ SOLN
10.0000 mL | INTRAMUSCULAR | Status: DC | PRN
Start: 2014-10-27 — End: 2015-03-16
  Administered 2014-10-27: 10 mL
  Filled 2014-10-27: qty 10

## 2014-11-02 ENCOUNTER — Ambulatory Visit: Payer: Medicare Other

## 2014-11-02 DIAGNOSIS — I890 Lymphedema, not elsewhere classified: Secondary | ICD-10-CM

## 2014-11-02 NOTE — Therapy (Signed)
[place], [place], [address] Phone: [phone]   Fax:  [phone]  Physical Therapy Treatment  Patient Details  Name: Amanda Robbins MRN: [id number] Date of Birth: 27-Apr-1941 Referring Provider:  Lloyd Huger, MD  Encounter Date: 11/02/2014      PT End of Session - 11/02/14 1106    Visit Number 6   Number of Visits 9   Date for PT Re-Evaluation 11/10/14   PT Start Time 1517   PT Stop Time 1105   PT Time Calculation (min) 42 min      Past Medical History  Diagnosis Date  . 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 Irritable bowel   . C. difficile colitis 2011  . Allergic rhinitis     Followed by Dr. Donneta Romberg  . Esophageal reflux     Followed by GI physician, on Dexilant and Ranitidine  . Migraine   . 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 Osteoporosis 2009    on Reclast once yearly, Followed by Dr. Jefm Bryant  . Ovarian cancer   . COPD (chronic obstructive pulmonary disease)   . Thrombocytopenia     Past Surgical History  Procedure Laterality Date  . Bil roken wrists    . Gallbladder removed    . Bilateral bunionectomies    . Cesarean section    . Abdominal hysterectomy    . Cholecystectomy      There were no vitals filed for this visit.  Visit Diagnosis:  Lymphedema of both lower extremities      Subjective Assessment - 11/02/14 1025    Subjective My feet and ankles have been doing really well, still having some pain in my ankles at time but think thats the neuropathy. Been using the foam she gave me last time at my thighs and thats really helping too, noticing that they are softer now.Just feeling tender to touch at my lateral caves right now.   Currently in Pain? No/denies               LYMPHEDEMA/ONCOLOGY QUESTIONNAIRE - 11/02/14 1029    Right Lower Extremity Lymphedema   30 cm Proximal to Suprapatella 56.6 cm   20 cm Proximal to  Suprapatella 53.4 cm   10 cm Proximal to Suprapatella 44.6 cm   At Midpatella/Popliteal Crease 34.8 cm  At crease   30 cm Proximal to Floor at Lateral Plantar Foot 33.4 cm   20 cm Proximal to Floor at Lateral Plantar Foot 27 1   10  cm Proximal to Floor at Lateral Malleoli 20.6 cm   5 cm Proximal to 1st MTP Joint 20.7 cm   Around Proximal Great Toe 6.8 cm   Left Lower Extremity Lymphedema   30 cm Proximal to Suprapatella 54.5 cm  Taken in supine   20 cm Proximal to Suprapatella 48.7 cm   10 cm Proximal to Suprapatella 41.4 cm   At Midpatella/Popliteal Crease 33.4 cm  At crease   30 cm Proximal to Floor at Lateral Plantar Foot 32.2 cm   20 cm Proximal to Floor at Lateral Plantar Foot 27 cm   10 cm Proximal to Floor at Lateral Malleoli 20.5 cm   5 cm Proximal to 1st MTP Joint 20.2 cm   Around Proximal Great Toe 6.9 cm                  [facility] Adult PT Treatment/Exercise - 11/02/14 0001  Manual Therapy   Manual Lymphatic Drainage (MLD) In Supine with HOB elevated: Short neck, RT axillary nodes, superficial and deep abdominals, RT lower quadrant, Rt inguino-axillary anastomosis and Rt LE from dorsal foot to lateral thigh,same on left leg.                    Short Term Clinic Goals - 10/06/14 1757    CC Short Term Goal  #1   Title short term goals= long term goals              Long Term Clinic Goals - 11/02/14 1218    CC Long Term Goal  #1   Title Patient will verbalize understanding of lymphedema risk reduction practices   Status On-going   CC Long Term Goal  #3   Title Patient will be know how to obtain and use compression garments for maintenance phase of treatment   Status On-going   CC Long Term Goal  #4   Title Patient will be independent in self manual lymph drainage   Status Achieved            Plan - 11/02/14 1216    Clinical Impression Statement Pts circumference measurements are reduced well today at all locations and pt reports she  is feeling more confident with self manual lymph drainage remembering to go slow when she is working on her legs; goal met.    Pt will benefit from skilled therapeutic intervention in order to improve on the following deficits Decreased strength;Decreased knowledge of precautions;Decreased knowledge of use of DME;Difficulty walking;Increased edema;Decreased scar mobility;Decreased endurance;Increased fascial restricitons   Rehab Potential Good   Clinical Impairments Affecting Rehab Potential ongoing chemotherapy every 4 weeks   PT Frequency 2x / week   PT Duration 4 weeks   PT Treatment/Interventions ADLs/Self Care Home Management;Therapeutic exercise;Compression bandaging;Scar mobilization;DME Instruction;Patient/family education;Manual techniques;Manual lymph drainage;Therapeutic activities   PT Next Visit Plan Continue Manual lymph draianage to both lower extremities, measure both legs for reassessment as pt is once a week now. Review lymphedema risk reduction practices to assess for goal achievement   Consulted and Agree with Plan of Care Patient        Problem List Patient Active Problem List   Diagnosis Date Noted  . Ovarian cancer 08/31/2014  . 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 Brain metastasis 06/01/2014  . Abdominal cramping 02/03/2014  . Dizzinesses 05/26/2013  . Medicare annual wellness visit, subsequent 11/22/2011  . GERD (gastroesophageal reflux disease) 08/22/2011  . Neuropathy 05/24/2011  . Insomnia 05/24/2011  . Osteoporosis 05/24/2011  . IBS (irritable bowel syndrome) 05/24/2011  . Migraine 05/24/2011  . Thrombocytopenia 05/24/2011  . Hyperlipidemia 05/24/2011    Otelia Limes, PTA 11/02/2014, 12:19 PM  [place], [place], [address] Phone: [phone]   Fax:  [phone]

## 2014-11-05 ENCOUNTER — Encounter: Payer: Medicare Other | Admitting: Physical Therapy

## 2014-11-10 ENCOUNTER — Ambulatory Visit: Payer: Medicare Other

## 2014-11-10 ENCOUNTER — Inpatient Hospital Stay: Payer: Medicare Other

## 2014-11-10 ENCOUNTER — Other Ambulatory Visit: Payer: Medicare Other

## 2014-11-10 DIAGNOSIS — C569 Malignant neoplasm of unspecified ovary: Secondary | ICD-10-CM

## 2014-11-10 DIAGNOSIS — I890 Lymphedema, not elsewhere classified: Secondary | ICD-10-CM | POA: Diagnosis not present

## 2014-11-10 DIAGNOSIS — Z5111 Encounter for antineoplastic chemotherapy: Secondary | ICD-10-CM | POA: Diagnosis not present

## 2014-11-10 LAB — CBC WITH DIFFERENTIAL/PLATELET
BASOS PCT: 0 %
Basophils Absolute: 0 10*3/uL (ref 0–0.1)
EOS ABS: 0 10*3/uL (ref 0–0.7)
Eosinophils Relative: 1 %
HCT: 34.7 % — ABNORMAL LOW (ref 35.0–47.0)
Hemoglobin: 12.1 g/dL (ref 12.0–16.0)
Lymphocytes Relative: 49 %
Lymphs Abs: 1.3 10*3/uL (ref 1.0–3.6)
MCH: 36.9 pg — ABNORMAL HIGH (ref 26.0–34.0)
MCHC: 35 g/dL (ref 32.0–36.0)
MCV: 105.5 fL — ABNORMAL HIGH (ref 80.0–100.0)
Monocytes Absolute: 0.3 10*3/uL (ref 0.2–0.9)
Monocytes Relative: 11 %
NEUTROS PCT: 39 %
Neutro Abs: 1 10*3/uL — ABNORMAL LOW (ref 1.4–6.5)
Platelets: 114 10*3/uL — ABNORMAL LOW (ref 150–440)
RBC: 3.29 MIL/uL — AB (ref 3.80–5.20)
RDW: 15.3 % — ABNORMAL HIGH (ref 11.5–14.5)
WBC: 2.6 10*3/uL — AB (ref 3.6–11.0)

## 2014-11-10 LAB — BASIC METABOLIC PANEL
Anion gap: 5 (ref 5–15)
BUN: 23 mg/dL — ABNORMAL HIGH (ref 6–20)
CHLORIDE: 108 mmol/L (ref 101–111)
CO2: 25 mmol/L (ref 22–32)
CREATININE: 0.91 mg/dL (ref 0.44–1.00)
Calcium: 9.1 mg/dL (ref 8.9–10.3)
GFR calc non Af Amer: >60 mL/min (ref >60)
Glucose, Bld: 102 mg/dL — ABNORMAL HIGH (ref 65–99)
Potassium: 4 mmol/L (ref 3.5–5.1)
Sodium: 138 mmol/L (ref 135–145)

## 2014-11-10 NOTE — Therapy (Addendum)
[place], [place], [address] Phone: [phone]   Fax:  [phone]  Physical Therapy Treatment  Patient Details  Name: Amanda Robbins MRN: [id number] Date of Birth: [birth year] Referring Provider:  Lloyd Huger, MD  Encounter Date: 11/10/2014      PT End of Session - 11/10/14 1103    Visit Number 7   Number of Visits 9   Date for PT Re-Evaluation 11/10/14   PT Start Time 1020   PT Stop Time 1101   PT Time Calculation (min) 41 min      Past Medical History  Diagnosis Date  . 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 Irritable bowel   . C. difficile colitis 2011  . Allergic rhinitis     Followed by Dr. Donneta Romberg  . Esophageal reflux     Followed by GI physician, on Dexilant and Ranitidine  . Migraine   . 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 Osteoporosis 2009    on Reclast once yearly, Followed by Dr. Jefm Bryant  . Ovarian cancer   . COPD (chronic obstructive pulmonary disease)   . Thrombocytopenia     Past Surgical History  Procedure Laterality Date  . Bil roken wrists    . Gallbladder removed    . Bilateral bunionectomies    . Cesarean section    . Abdominal hysterectomy    . Cholecystectomy      There were no vitals filed for this visit.  Visit Diagnosis:  Lymphedema of both lower extremities      Subjective Assessment - 11/10/14 1023    Subjective Feel like I'm doing really good, my legs are smaller and I'm wearing my regular shoes today. They are a little tight, but they're okay.    Currently in Pain? No/denies               LYMPHEDEMA/ONCOLOGY QUESTIONNAIRE - 11/10/14 1025    Right Lower Extremity Lymphedema   20 cm Proximal to Suprapatella 52 cm   10 cm Proximal to Suprapatella 46.4 cm   At Midpatella/Popliteal Crease 34.2 cm   30 cm Proximal to Floor at Lateral Plantar Foot 33.9 cm   20 cm Proximal to Floor at Lateral Plantar  Foot 27._0 cm Proximal to Floor at Lateral Malleoli 21 cm   5 cm Proximal to 1st MTP Joint 21.2 cm   Around Proximal Great Toe 7 cm   Left Lower Extremity Lymphedema   30 cm Proximal to Suprapatella 54.2 cm  Taken in supine   20 cm Proximal to Suprapatella 48.8 cm   10 cm Proximal to Suprapatella 41.9 cm   At Midpatella/Popliteal Crease 33.4 cm  At crease   30 cm Proximal to Floor at Lateral Plantar Foot 32.1 cm   20 cm Proximal to Floor at Lateral Plantar Foot 25.9 cm   10 cm Proximal to Floor at Lateral Malleoli 21.2 cm   5 cm Proximal to 1st MTP Joint 20.7 cm   Around Proximal Great Toe 7 cm                  [facility] Adult PT Treatment/Exercise - 11/10/14 0001    Manual Therapy   Manual Lymphatic Drainage (MLD) In Supine with HOB elevated: Short neck, RT axillary nodes, RT lower quadrant, Rt inguino-axillary anastomosis and Rt LE from dorsal foot to lateral thigh,same on left leg.  Short Term Clinic Goals - 10/06/14 1757    CC Short Term Goal  #1   Title short term goals= long term goals              Long Term Clinic Goals - 11/10/14 1104    CC Long Term Goal  #1   Title Patient will verbalize understanding of lymphedema risk reduction practices   Status On-going   CC Long Term Goal  #3   Title Patient will be know how to obtain and use compression garments for maintenance phase of treatment   Status On-going            Plan - 11/10/14 1103    Clinical Impression Statement Pts circumference measurements were down in some areas but slightly elevated at others. Overall she appears to be doing very well with Maintenance Phase of treatment and will be ready for discharge soon per primary poc.   Pt will benefit from skilled therapeutic intervention in order to improve on the following deficits Decreased strength;Decreased knowledge of precautions;Decreased knowledge of use of DME;Difficulty walking;Increased edema;Decreased  scar mobility;Decreased endurance;Increased fascial restricitons   Rehab Potential Good   Clinical Impairments Affecting Rehab Potential ongoing chemotherapy every 4 weeks   PT Frequency 2x / week   PT Duration 4 weeks   PT Treatment/Interventions ADLs/Self Care Home Management;Therapeutic exercise;Compression bandaging;Scar mobilization;DME Instruction;Patient/family education;Manual techniques;Manual lymph drainage;Therapeutic activities   PT Next Visit Plan Continue Manual lymph draianage to both lower extremities, measure both legs for reassessment as pt is once a week now. Review lymphedema risk reduction practices to assess for goal achievement   Consulted and Agree with Plan of Care Patient        Problem List Patient Active Problem List   Diagnosis Date Noted  . Ovarian cancer 08/31/2014  . 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 Brain metastasis 06/01/2014  . Abdominal cramping 02/03/2014  . Dizzinesses 05/26/2013  . Medicare annual wellness visit, subsequent 11/22/2011  . GERD (gastroesophageal reflux disease) 08/22/2011  . Neuropathy 05/24/2011  . Insomnia 05/24/2011  . Osteoporosis 05/24/2011  . IBS (irritable bowel syndrome) 05/24/2011  . Migraine 05/24/2011  . Thrombocytopenia 05/24/2011  . Hyperlipidemia 05/24/2011    Otelia Limes, PTA 11/10/2014, 11:05 AM     PHYSICAL THERAPY DISCHARGE SUMMARY  Visits from Start of Care: 7  Current functional level related to goals / functional outcomes: Improved    Remaining deficits: Lymphedema    Education / Equipment: Lymphedema management with self manual lymph drainage and use of compression Plan: Patient agrees to discharge.  Patient goals were partially met. Patient is being discharged due to not returning since the last visit.  ?????        Maudry Diego, PT 07/22/2015 11:36 AM   [place],  [place], [address] Phone: [phone]   Fax:  [phone]

## 2014-11-12 ENCOUNTER — Encounter: Payer: Medicare Other | Admitting: Physical Therapy

## 2014-11-12 ENCOUNTER — Other Ambulatory Visit: Payer: Self-pay | Admitting: Oncology

## 2014-11-12 DIAGNOSIS — C569 Malignant neoplasm of unspecified ovary: Secondary | ICD-10-CM

## 2014-11-12 NOTE — Progress Notes (Signed)
[facility]  [phone] [phone]  ID: Amanda Robbins OB: 01-13-1941  MR#: [id number]  TGG#:[id number]  Patient Care Team: Jackolyn Confer, MD as PCP - General (Internal Medicine)  CHIEF COMPLAINT:  Chief Complaint  Patient presents with  . Follow-up    ovarian cancer, brain mets    INTERVAL HISTORY: Patient returns to clinic today for further evaluation and reconsideration of cycle 4 of single agent carboplatinum. She continues to feel weak and fatigued. She does not complain of peripheral neuropathy today. Her bilateral lower extremity edema is unchanged. She denies any fevers or illnesses. She denies any chest pain or shortness of breath. She has a fair appetite and denies weight loss. She denies any nausea, vomiting, constipation, or diarrhea. She has no urinary complaints. Patient offers no further specific complaints.   REVIEW OF SYSTEMS:   Review of Systems  Constitutional: Positive for malaise/fatigue. Negative for weight loss.  Neurological: Positive for weakness.    As per HPI. Otherwise, a complete review of systems is negatve.  PAST MEDICAL HISTORY: Past Medical History  Diagnosis Date  . 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 Irritable bowel   . C. difficile colitis 2011  . Allergic rhinitis     Followed by Dr. Donneta Romberg  . Esophageal reflux     Followed by GI physician, on Dexilant and Ranitidine  . Migraine   . 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 Osteoporosis 2009    on Reclast once yearly, Followed by Dr. Jefm Bryant  . Ovarian cancer   . COPD (chronic obstructive pulmonary disease)   . Thrombocytopenia     PAST SURGICAL HISTORY: Past Surgical History  Procedure Laterality Date  . Bil roken wrists    . Gallbladder removed    . Bilateral bunionectomies    . Cesarean section    . Abdominal hysterectomy    . Cholecystectomy      FAMILY HISTORY No family history on  file.     ADVANCED DIRECTIVES:    HEALTH MAINTENANCE: History  Substance Use Topics  . Smoking status: Former Smoker    Quit date: 06/12/1989  . Smokeless tobacco: Never Used  . Alcohol Use: No     Colonoscopy:  PAP:  Bone density:  Lipid panel:  Allergies  Allergen Reactions  . Aspirin     Other reaction(s): Localized superficial swelling of skin uncoated asa-causes throat to swell up  . Codeine Itching    Other reaction(s): Itching of Skin  . Fosamax  [Alendronate Sodium]     Other reaction(s): Distress (finding)  . Ibuprofen   . Ibuprofen     Other reaction(s): Distress (finding)  . Naproxen     Other reaction(s): Distress (finding)  . Naproxen Sodium   . Nsaids     GI UPSET / GERD  . Oxycodone     Other reaction(s): Diarrhea and vomiting (finding)  . Oxycodone-Acetaminophen     Other reaction(s): Diarrhea and vomiting (finding)    Current Outpatient Prescriptions  Medication Sig Dispense Refill  . Acetaminophen 500 MG coapsule Take 1,000 mg by mouth every 6 (six) hours as needed for fever.    . Calcium Carb-Cholecalciferol (CALCIUM 1000 + D PO) Take by mouth daily.      . DimenhyDRINATE (DRAMAMINE PO) Take by mouth.    Arne Cleveland 5 MG TABS tablet Take 5 mg by mouth 2 (two) times daily.  0  . furosemide (LASIX) 20 MG tablet  take 1 tablet by mouth once daily 30 tablet 5  . megestrol (MEGACE) 40 MG/ML suspension Take 10 mg by mouth 3 (three) times daily. 30 minutes before meals for appetite    . morphine 20 MG/5ML solution Take 20 mg by mouth every 4 (four) hours as needed for pain.    . Multiple Vitamin (MULTIVITAMIN) tablet Take 1 tablet by mouth daily.      Marland Kitchen omeprazole (PRILOSEC) 20 MG capsule   0  . potassium chloride SA (K-DUR,KLOR-CON) 20 MEQ tablet Take 20 mEq by mouth 2 (two) times daily.  0  . prochlorperazine (COMPAZINE) 10 MG tablet Take 10 mg by mouth 3 (three) times daily.  0  . topiramate (TOPAMAX) 25 MG tablet Take 1-2 tablets (25-50 mg total)  by mouth daily. 60 tablet 5  . oxymetazoline (AFRIN) 0.05 % nasal spray Place 2 sprays into the nose 2 (two) times daily.    . sucralfate (CARAFATE) 1 G tablet   0  . [DISCONTINUED] dexlansoprazole (DEXILANT) 60 MG capsule Take 60 mg by mouth daily.       No current facility-administered medications for this visit.   Facility-Administered Medications Ordered in Other Visits  Medication Dose Route Frequency Provider Last Rate Last Dose  . sodium chloride 0.9 % injection 10 mL  10 mL Intracatheter PRN Lloyd Huger, MD   10 mL at 10/27/14 1000    OBJECTIVE: Filed Vitals:   10/27/14 1125  BP: 103/69  Pulse: 86  Temp: 98.3 F (36.8 C)  Resp: 16     Body mass index is 20.9 kg/(m^2).    ECOG FS:2 - Symptomatic, <50% confined to bed  General: Well-developed, well-nourished, no acute distress. Sitting in a wheelchair. Eyes: anicteric sclera. Lungs: Clear to auscultation bilaterally. Heart: Regular rate and rhythm. No rubs, murmurs, or gallops. Abdomen: Soft, nontender, nondistended. No organomegaly noted, normoactive bowel sounds. Musculoskeletal: 2+ bilateral lower extremity edema. Neuro: Alert, answering all questions appropriately. Cranial nerves grossly intact. Skin: No rashes or petechiae noted. Psych: Normal affect.   LAB RESULTS:  Lab Results  Component Value Date   NA 138 11/10/2014   K 4.0 11/10/2014   CL 108 11/10/2014   CO2 25 11/10/2014   GLUCOSE 102* 11/10/2014   BUN 23* 11/10/2014   CREATININE 0.91 11/10/2014   CALCIUM 9.1 11/10/2014   PROT 6.2* 10/13/2014   ALBUMIN 3.5 10/13/2014   AST 22 10/13/2014   ALT 12* 10/13/2014   ALKPHOS 48 10/13/2014   BILITOT 0.7 10/13/2014   GFRNONAA >60 11/10/2014   GFRAA >60 11/10/2014    Lab Results  Component Value Date   WBC 2.6* 11/10/2014   NEUTROABS 1.0* 11/10/2014   HGB 12.1 11/10/2014   HCT 34.7* 11/10/2014   MCV 105.5* 11/10/2014   PLT 114* 11/10/2014     STUDIES: No results found.  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  PLAN:    1. Ovarian cancer: Proceed with cycle 4 of 6 of single agent carboplatinum today. Taxol was discontinued secondary to a reaction. Plan on giving a total of 6 cycles with reimaging after cycle 4. Can consider adding Taxotere in the future if her performance status improves. Because of thrombocytopenia, patient can only receive infusions every 4 weeks. Return to clinic in 4 weeks for consideration of cycle 5. Patient expressed understanding and was in agreement with this plan. 2. Peripheral edema: Likely multifactorial. Continue diuretic as prescribed, monitor.  Patient was also given a referral  to lymphedema clinic. 3. Weakness and fatigue/decreased performance status: Secondary to chemotherapy as well as malignancy. Monitor. 4. Thrombocytopenia: Secondary to chemotherapy.  5. Leukopenia: Secondary to chemotherapy. Monitor.  Patient expressed understanding and was in agreement with this plan. She also understands that She can call clinic at any time with any questions, concerns, or complaints.   No matching staging information was found for the patient.  Lloyd Huger, MD   11/12/2014 4:15 PM

## 2014-11-19 ENCOUNTER — Ambulatory Visit
Admission: RE | Admit: 2014-11-19 | Discharge: 2014-11-19 | Disposition: A | Discharge status: Home / Self Care | Payer: Medicare Other | Source: Ambulatory Visit | Attending: Oncology | Admitting: Oncology

## 2014-11-19 DIAGNOSIS — R590 Localized enlarged lymph nodes: Secondary | ICD-10-CM | POA: Insufficient documentation

## 2014-11-19 DIAGNOSIS — J984 Other disorders of lung: Secondary | ICD-10-CM | POA: Insufficient documentation

## 2014-11-19 DIAGNOSIS — C569 Malignant neoplasm of unspecified ovary: Secondary | ICD-10-CM | POA: Insufficient documentation

## 2014-11-19 LAB — GLUCOSE, CAPILLARY: Glucose-Capillary: 93 mg/dL (ref 65–99)

## 2014-11-19 MED ORDER — FLUDEOXYGLUCOSE F - 18 (FDG) INJECTION
12.1600 | Freq: Once | INTRAVENOUS | Status: AC | PRN
  Administered 2014-11-19: 12.16 via INTRAVENOUS

## 2014-11-23 ENCOUNTER — Other Ambulatory Visit: Payer: Self-pay | Admitting: Oncology

## 2014-11-24 ENCOUNTER — Inpatient Hospital Stay (HOSPITAL_BASED_OUTPATIENT_CLINIC_OR_DEPARTMENT_OTHER): Payer: Medicare Other | Admitting: Oncology

## 2014-11-24 ENCOUNTER — Inpatient Hospital Stay: Payer: Medicare Other | Attending: Oncology

## 2014-11-24 ENCOUNTER — Ambulatory Visit: Payer: Medicare Other

## 2014-11-24 VITALS — BP 94/64 | HR 80 | Temp 98.4°F | Resp 16 | Wt 133.8 lb

## 2014-11-24 DIAGNOSIS — D701 Agranulocytosis secondary to cancer chemotherapy: Secondary | ICD-10-CM | POA: Insufficient documentation

## 2014-11-24 DIAGNOSIS — D6959 Other secondary thrombocytopenia: Secondary | ICD-10-CM | POA: Insufficient documentation

## 2014-11-24 DIAGNOSIS — R531 Weakness: Secondary | ICD-10-CM

## 2014-11-24 DIAGNOSIS — T451X5S Adverse effect of antineoplastic and immunosuppressive drugs, sequela: Secondary | ICD-10-CM

## 2014-11-24 DIAGNOSIS — R5383 Other fatigue: Secondary | ICD-10-CM

## 2014-11-24 DIAGNOSIS — R600 Localized edema: Secondary | ICD-10-CM | POA: Insufficient documentation

## 2014-11-24 DIAGNOSIS — C569 Malignant neoplasm of unspecified ovary: Secondary | ICD-10-CM | POA: Insufficient documentation

## 2014-11-24 DIAGNOSIS — C7931 Secondary malignant neoplasm of brain: Secondary | ICD-10-CM | POA: Diagnosis not present

## 2014-11-24 DIAGNOSIS — K219 Gastro-esophageal reflux disease without esophagitis: Secondary | ICD-10-CM | POA: Insufficient documentation

## 2014-11-24 DIAGNOSIS — M810 Age-related osteoporosis without current pathological fracture: Secondary | ICD-10-CM | POA: Insufficient documentation

## 2014-11-24 DIAGNOSIS — J449 Chronic obstructive pulmonary disease, unspecified: Secondary | ICD-10-CM | POA: Diagnosis not present

## 2014-11-24 DIAGNOSIS — Z79899 Other long term (current) drug therapy: Secondary | ICD-10-CM | POA: Diagnosis not present

## 2014-11-24 DIAGNOSIS — Z87891 Personal history of nicotine dependence: Secondary | ICD-10-CM | POA: Insufficient documentation

## 2014-11-24 LAB — CBC WITH DIFFERENTIAL/PLATELET
BASOS ABS: 0 10*3/uL (ref 0–0.1)
BASOS PCT: 0 %
Eosinophils Absolute: 0.1 10*3/uL (ref 0–0.7)
Eosinophils Relative: 2 %
HEMATOCRIT: 33.6 % — AB (ref 35.0–47.0)
Hemoglobin: 11.5 g/dL — ABNORMAL LOW (ref 12.0–16.0)
LYMPHS PCT: 34 %
Lymphs Abs: 1 10*3/uL (ref 1.0–3.6)
MCH: 36.6 pg — ABNORMAL HIGH (ref 26.0–34.0)
MCHC: 34.2 g/dL (ref 32.0–36.0)
MCV: 106.9 fL — AB (ref 80.0–100.0)
Monocytes Absolute: 0.2 10*3/uL (ref 0.2–0.9)
Monocytes Relative: 5 %
NEUTROS PCT: 59 %
Neutro Abs: 1.8 10*3/uL (ref 1.4–6.5)
Platelets: 59 10*3/uL — ABNORMAL LOW (ref 150–440)
RBC: 3.14 MIL/uL — ABNORMAL LOW (ref 3.80–5.20)
RDW: 13.4 % (ref 11.5–14.5)
WBC: 3.1 10*3/uL — ABNORMAL LOW (ref 3.6–11.0)

## 2014-11-24 LAB — BASIC METABOLIC PANEL
Anion gap: 4 — ABNORMAL LOW (ref 5–15)
BUN: 19 mg/dL (ref 6–20)
CALCIUM: 9 mg/dL (ref 8.9–10.3)
CO2: 23 mmol/L (ref 22–32)
Chloride: 109 mmol/L (ref 101–111)
Creatinine, Ser: 0.85 mg/dL (ref 0.44–1.00)
GFR calc Af Amer: >60 mL/min (ref >60)
GLUCOSE: 125 mg/dL — AB (ref 65–99)
Potassium: 3.4 mmol/L — ABNORMAL LOW (ref 3.5–5.1)
SODIUM: 136 mmol/L (ref 135–145)

## 2014-11-24 MED ORDER — SODIUM CHLORIDE 0.9 % IJ SOLN
10.0000 mL | INTRAMUSCULAR | Status: DC | PRN
  Filled 2014-11-24: qty 10

## 2014-11-24 MED ORDER — HEPARIN SOD (PORK) LOCK FLUSH 100 UNIT/ML IV SOLN
500.0000 [IU] | Freq: Once | INTRAVENOUS | Status: AC
  Administered 2014-11-24: 500 [IU] via INTRAVENOUS
  Filled 2014-11-24: qty 5

## 2014-11-27 NOTE — Progress Notes (Signed)
[place]  [phone]) [phone] [phone]) [phone]  ID: Amanda Robbins OB: 30-Oct-1940  MR#: [id number]  WCB#:[id number]  Patient Care Team: Jackolyn Confer, MD as PCP - General (Internal Medicine)  CHIEF COMPLAINT:  Chief Complaint  Patient presents with  . Follow-up    ovarian cancer, brain mets    INTERVAL HISTORY: Patient returns to clinic today for further evaluation, discussion of her imaging results, and consideration of cycle 5 of single agent carboplatinum. She continues to feel weak and fatigued. She does not complain of peripheral neuropathy today. Her bilateral lower extremity edema is improved since going to lymphedema clinic. She denies any fevers or illnesses. She denies any chest pain or shortness of breath. She has a fair appetite and denies weight loss. She denies any nausea, vomiting, constipation, or diarrhea. She has no urinary complaints. Patient offers no further specific complaints.   REVIEW OF SYSTEMS:   Review of Systems  Constitutional: Positive for malaise/fatigue. Negative for weight loss.  Cardiovascular: Positive for leg swelling.  Neurological: Positive for weakness.    As per HPI. Otherwise, a complete review of systems is negatve.  PAST MEDICAL HISTORY: Past Medical History  Diagnosis Date  . 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 Irritable bowel   . C. difficile colitis 2011  . Allergic rhinitis     Followed by Dr. Donneta Romberg  . Esophageal reflux     Followed by GI physician, on Dexilant and Ranitidine  . Migraine   . 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 Osteoporosis 2009    on Reclast once yearly, Followed by Dr. Jefm Bryant  . Ovarian cancer   . COPD (chronic obstructive pulmonary disease)   . Thrombocytopenia     PAST SURGICAL HISTORY: Past Surgical History  Procedure Laterality Date  . Bil roken wrists    . Gallbladder removed    . Bilateral bunionectomies    . Cesarean section     . Abdominal hysterectomy    . Cholecystectomy      FAMILY HISTORY: Unchanged. No reported history of malignancy or chronic disease.     ADVANCED DIRECTIVES:    HEALTH MAINTENANCE: History  Substance Use Topics  . Smoking status: Former Smoker    Quit date: 06/12/1989  . Smokeless tobacco: Never Used  . Alcohol Use: No     Colonoscopy:  PAP:  Bone density:  Lipid panel:  Allergies  Allergen Reactions  . Aspirin     Other reaction(s): Localized superficial swelling of skin uncoated asa-causes throat to swell up  . Codeine Itching    Other reaction(s): Itching of Skin  . Fosamax  [Alendronate Sodium]     Other reaction(s): Distress (finding)  . Ibuprofen   . Ibuprofen     Other reaction(s): Distress (finding)  . Naproxen     Other reaction(s): Distress (finding)  . Naproxen Sodium   . Nsaids     GI UPSET / GERD  . Oxycodone     Other reaction(s): Diarrhea and vomiting (finding)  . Oxycodone-Acetaminophen     Other reaction(s): Diarrhea and vomiting (finding)    Current Outpatient Prescriptions  Medication Sig Dispense Refill  . Acetaminophen 500 MG coapsule Take 1,000 mg by mouth every 6 (six) hours as needed for fever.    . Calcium Carb-Cholecalciferol (CALCIUM 1000 + D PO) Take by mouth daily.      . DimenhyDRINATE (DRAMAMINE PO) Take by mouth.    Arne Cleveland 5 MG  TABS tablet Take 5 mg by mouth 2 (two) times daily.  0  . furosemide (LASIX) 20 MG tablet take 1 tablet by mouth once daily 30 tablet 5  . megestrol (MEGACE) 40 MG/ML suspension Take 10 mg by mouth 3 (three) times daily. 30 minutes before meals for appetite    . Multiple Vitamin (MULTIVITAMIN) tablet Take 1 tablet by mouth daily.      Marland Kitchen omeprazole (PRILOSEC) 20 MG capsule   0  . oxymetazoline (AFRIN) 0.05 % nasal spray Place 2 sprays into the nose 2 (two) times daily.    . potassium chloride SA (K-DUR,KLOR-CON) 20 MEQ tablet Take 20 mEq by mouth 2 (two) times daily.  0  . prochlorperazine  (COMPAZINE) 10 MG tablet Take 10 mg by mouth 3 (three) times daily.  0  . sucralfate (CARAFATE) 1 G tablet   0  . topiramate (TOPAMAX) 25 MG tablet Take 1-2 tablets (25-50 mg total) by mouth daily. 60 tablet 5  . [DISCONTINUED] dexlansoprazole (DEXILANT) 60 MG capsule Take 60 mg by mouth daily.       No current facility-administered medications for this visit.   Facility-Administered Medications Ordered in Other Visits  Medication Dose Route Frequency Provider Last Rate Last Dose  . sodium chloride 0.9 % injection 10 mL  10 mL Intracatheter PRN Lloyd Huger, MD   10 mL at 10/27/14 1000    OBJECTIVE: Filed Vitals:   11/24/14 1341  BP: 94/64  Pulse: 80  Temp: 98.4 F (36.9 C)  Resp: 16     Body mass index is 20.76 kg/(m^2).    ECOG FS:2 - Symptomatic, <50% confined to bed  General: Well-developed, well-nourished, no acute distress. Sitting in a wheelchair. Eyes: anicteric sclera. Lungs: Clear to auscultation bilaterally. Heart: Regular rate and rhythm. No rubs, murmurs, or gallops. Abdomen: Soft, nontender, nondistended. No organomegaly noted, normoactive bowel sounds. Musculoskeletal: 2+ bilateral lower extremity edema. Neuro: Alert, answering all questions appropriately. Cranial nerves grossly intact. Skin: No rashes or petechiae noted. Psych: Normal affect.   LAB RESULTS:  Lab Results  Component Value Date   NA 136 11/24/2014   K 3.4* 11/24/2014   CL 109 11/24/2014   CO2 23 11/24/2014   GLUCOSE 125* 11/24/2014   BUN 19 11/24/2014   CREATININE 0.85 11/24/2014   CALCIUM 9.0 11/24/2014   PROT 6.2* 10/13/2014   ALBUMIN 3.5 10/13/2014   AST 22 10/13/2014   ALT 12* 10/13/2014   ALKPHOS 48 10/13/2014   BILITOT 0.7 10/13/2014   GFRNONAA >60 11/24/2014   GFRAA >60 11/24/2014    Lab Results  Component Value Date   WBC 3.1* 11/24/2014   NEUTROABS 1.8 11/24/2014   HGB 11.5* 11/24/2014   HCT 33.6* 11/24/2014   MCV 106.9* 11/24/2014   PLT 59* 11/24/2014      STUDIES: Nm Pet Image Restag (ps) Skull Base To Thigh  11/19/2014   CLINICAL DATA:  Subsequent treatment strategy for ovarian cancer. Chemotherapy of 3 weeks ago. Left arm nodule resected. Subsequent encounter.  EXAM: NUCLEAR MEDICINE PET SKULL BASE TO THIGH  TECHNIQUE: 12.16 mCi F-18 FDG was injected intravenously. Full-ring PET imaging was performed from the skull base to thigh after the radiotracer. CT data was obtained and used for attenuation correction and anatomic localization.  FASTING BLOOD GLUCOSE:  Value: 93 mg/dl  COMPARISON:  Abdominal CT 10/14/2012. PET-CT 05/27/2014. Chest CT 07/21/2014.  FINDINGS: NECK  No hypermetabolic lymph nodes are demonstrated within the upper neck. There is a 4 mm left  supraclavicular node on image 49. There is low-level hypermetabolic activity in this area which may be secondary to brown fat. However, given the abdominal findings, a small hypermetabolic lymph node is difficult to exclude. This has an SUV max of 3.1.There are no lesions of the pharyngeal mucosal space.  CHEST  There are no hypermetabolic mediastinal, hilar or axillary lymph nodes. Paraspinal activity is similar to the prior study and attributed to brown fat. Compared with the prior PET-CT, there is new airspace disease with volume loss in the right middle lobe. This demonstrates low-level hypermetabolic activity with an SUV max of 1.5. This right middle lobe opacity has partially cleared compared with the chest CT of 4 months ago. No discrete pulmonary nodules demonstrated.  ABDOMEN/PELVIS  There is no hypermetabolic activity within the liver, adrenal glands, spleen or pancreas. The patient has developed several ill-defined mildly enlarged retroperitoneal and retrocrural lymph nodes which demonstrate mildly elevated metabolic activity. An approximately 10 mm left periaortic node on image 166 has an SUV max of 4.6. Small hypermetabolic external iliac nodes are present bilaterally, not well-defined on  the CT images. These demonstrate an SUV max of 3.7 on the left and 3.5 on the right. No ascites or peritoneal nodularity identified. Diverticular changes within the colon, mild atherosclerosis and generalized subcutaneous edema within the pelvis are similar to the prior study.  SKELETON  There is no hypermetabolic activity to suggest osseous metastatic disease.  IMPRESSION: 1. Interval development of several small mildly hypermetabolic abdominal pelvic lymph nodes suspicious or metastatic disease. In light of this, a small left supraclavicular hypermetabolic node cannot be excluded, although this could be related to brown fat which is present elsewhere in the neck and chest. 2. Resolving right middle lobe airspace disease with mild hypermetabolic activity, probably inflammatory or pulmonary infarct.   Electronically Signed   By: Richardean Sale M.D.   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  PLAN:    1. Ovarian cancer: PET scan results reviewed independently and reported as above with mild progression of disease. Despite this, we will continue single agent carboplatinum for several more cycles. Delay cycle 5 of single agent carboplatinum today secondary to thrombocytopenia. Taxol was discontinued secondary to a reaction. Plan on giving a total of 6 cycles with reimaging after cycle 4. Can consider adding Taxotere in the future if her performance status improves. Because of thrombocytopenia, patient can only receive infusions every 4 weeks. Return to clinic in 1 week for reconsideration of cycle 5. If patient's platelet count improves to greater than 80 or 90 can proceed with treatment. Patient expressed understanding and was in agreement with this plan. 2. Peripheral edema: Likely multifactorial. Continue diuretic as prescribed, monitor. Continue recommendations per lymphedema clinic.  3. Weakness and fatigue/decreased performance status:  Secondary to chemotherapy as well as malignancy. Monitor. 4. Thrombocytopenia: Secondary to chemotherapy.  5. Leukopenia: Secondary to chemotherapy. Monitor.  Patient expressed understanding and was in agreement with this plan. She also understands that She can call clinic at any time with any questions, concerns, or complaints.   No matching staging information was found for the patient.  Lloyd Huger, MD   11/27/2014 1:33 PM

## 2014-12-01 ENCOUNTER — Inpatient Hospital Stay (HOSPITAL_BASED_OUTPATIENT_CLINIC_OR_DEPARTMENT_OTHER): Payer: Medicare Other | Admitting: Hematology and Oncology

## 2014-12-01 ENCOUNTER — Inpatient Hospital Stay: Payer: Medicare Other

## 2014-12-01 VITALS — BP 89/58 | HR 94 | Temp 97.5°F | Resp 20 | Wt 132.0 lb

## 2014-12-01 VITALS — BP 118/55 | HR 74

## 2014-12-01 DIAGNOSIS — J449 Chronic obstructive pulmonary disease, unspecified: Secondary | ICD-10-CM | POA: Diagnosis not present

## 2014-12-01 DIAGNOSIS — D693 Immune thrombocytopenic purpura: Secondary | ICD-10-CM

## 2014-12-01 DIAGNOSIS — D539 Nutritional anemia, unspecified: Secondary | ICD-10-CM

## 2014-12-01 DIAGNOSIS — Z9221 Personal history of antineoplastic chemotherapy: Secondary | ICD-10-CM

## 2014-12-01 DIAGNOSIS — Z888 Allergy status to other drugs, medicaments and biological substances status: Secondary | ICD-10-CM

## 2014-12-01 DIAGNOSIS — C569 Malignant neoplasm of unspecified ovary: Secondary | ICD-10-CM | POA: Diagnosis not present

## 2014-12-01 DIAGNOSIS — K219 Gastro-esophageal reflux disease without esophagitis: Secondary | ICD-10-CM

## 2014-12-01 DIAGNOSIS — M810 Age-related osteoporosis without current pathological fracture: Secondary | ICD-10-CM

## 2014-12-01 DIAGNOSIS — Z86711 Personal history of pulmonary embolism: Secondary | ICD-10-CM

## 2014-12-01 DIAGNOSIS — Z79899 Other long term (current) drug therapy: Secondary | ICD-10-CM

## 2014-12-01 DIAGNOSIS — Z87891 Personal history of nicotine dependence: Secondary | ICD-10-CM

## 2014-12-01 DIAGNOSIS — Z7901 Long term (current) use of anticoagulants: Secondary | ICD-10-CM

## 2014-12-01 LAB — BASIC METABOLIC PANEL
Anion gap: 5 (ref 5–15)
BUN: 17 mg/dL (ref 6–20)
CALCIUM: 8.4 mg/dL — AB (ref 8.9–10.3)
CO2: 23 mmol/L (ref 22–32)
CREATININE: 0.94 mg/dL (ref 0.44–1.00)
Chloride: 104 mmol/L (ref 101–111)
GFR calc Af Amer: >60 mL/min (ref >60)
GFR calc non Af Amer: 59 mL/min — ABNORMAL LOW (ref >60)
Glucose, Bld: 119 mg/dL — ABNORMAL HIGH (ref 65–99)
Potassium: 3.5 mmol/L (ref 3.5–5.1)
Sodium: 132 mmol/L — ABNORMAL LOW (ref 135–145)

## 2014-12-01 LAB — CBC WITH DIFFERENTIAL/PLATELET
BASOS ABS: 0 10*3/uL (ref 0–0.1)
Basophils Relative: 0 %
EOS ABS: 0.1 10*3/uL (ref 0–0.7)
EOS PCT: 3 %
HEMATOCRIT: 34.6 % — AB (ref 35.0–47.0)
Hemoglobin: 11.9 g/dL — ABNORMAL LOW (ref 12.0–16.0)
LYMPHS ABS: 1.2 10*3/uL (ref 1.0–3.6)
Lymphocytes Relative: 39 %
MCH: 36.7 pg — AB (ref 26.0–34.0)
MCHC: 34.5 g/dL (ref 32.0–36.0)
MCV: 106.5 fL — AB (ref 80.0–100.0)
MONO ABS: 0.2 10*3/uL (ref 0.2–0.9)
MONOS PCT: 8 %
Neutro Abs: 1.6 10*3/uL (ref 1.4–6.5)
Neutrophils Relative %: 50 %
PLATELETS: 107 10*3/uL — AB (ref 150–440)
RBC: 3.25 MIL/uL — AB (ref 3.80–5.20)
RDW: 13.2 % (ref 11.5–14.5)
WBC: 3.1 10*3/uL — ABNORMAL LOW (ref 3.6–11.0)

## 2014-12-01 LAB — FOLATE: Folate: 27 ng/mL (ref >5.9)

## 2014-12-01 LAB — TSH: TSH: 4.335 u[IU]/mL (ref 0.350–4.500)

## 2014-12-01 MED ORDER — HEPARIN SOD (PORK) LOCK FLUSH 100 UNIT/ML IV SOLN
500.0000 [IU] | Freq: Once | INTRAVENOUS | Status: AC | PRN
  Administered 2014-12-01: 500 [IU]
  Filled 2014-12-01: qty 5

## 2014-12-01 MED ORDER — SODIUM CHLORIDE 0.9 % IV SOLN
Freq: Once | INTRAVENOUS | Status: AC
  Administered 2014-12-01: 16:00:00 via INTRAVENOUS
  Filled 2014-12-01: qty 1000

## 2014-12-01 MED ORDER — SODIUM CHLORIDE 0.9 % IJ SOLN
10.0000 mL | INTRAMUSCULAR | Status: DC | PRN
  Administered 2014-12-01: 10 mL
  Filled 2014-12-01: qty 10

## 2014-12-01 MED ORDER — SODIUM CHLORIDE 0.9 % IV SOLN
301.2000 mg | Freq: Once | INTRAVENOUS | Status: AC
  Administered 2014-12-01: 300 mg via INTRAVENOUS
  Filled 2014-12-01: qty 30

## 2014-12-01 MED ORDER — SODIUM CHLORIDE 0.9 % IV SOLN
Freq: Once | INTRAVENOUS | Status: AC
  Administered 2014-12-01: 16:00:00 via INTRAVENOUS
  Filled 2014-12-01: qty 8

## 2014-12-01 NOTE — Progress Notes (Signed)
[facility] day:  12/01/2014  Chief Complaint: Amanda Robbins is a 73 y.o. female with stage IV ovarian cancer who is seen for assessment prior to cycle #5 carboplatin.  HPI: The patient was last seen in the medical oncology clinic on 11/24/2014.  At that time, she was to have received cycle #5 of single agent carboplatin. Chemotherapy was held for platelet count of 59,000. She does not receive Taxol secondary to her prior reaction.  She states that her first 2 treatments were on time. Her carboplatin dose was decreased when the chemotherapy was postponed secondary to low counts. Symptomatically, the patient states that she feels good. For 4 days after chemotherapy, she does not feel very good.  She describes her symptoms as being fatigued and nauseated.    Past Medical History  Diagnosis Date  . 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 Irritable bowel   . C. difficile colitis 2011  . Allergic rhinitis     Followed by Dr. Donneta Romberg  . Esophageal reflux     Followed by GI physician, on Dexilant and Ranitidine  . Migraine   . 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 Osteoporosis 2009    on Reclast once yearly, Followed by Dr. Jefm Bryant  . Ovarian cancer   . COPD (chronic obstructive pulmonary disease)   . Thrombocytopenia     Past Surgical History  Procedure Laterality Date  . Bil roken wrists    . Gallbladder removed    . Bilateral bunionectomies    . Cesarean section    . Abdominal hysterectomy    . Cholecystectomy      No family history on file.  Social History:  reports that she quit smoking about 25 years ago. She has never used smokeless tobacco. She reports that she does not drink alcohol or use illicit drugs.  The patient is accompanied by her husband today.  Allergies:  Allergies  Allergen Reactions  . Aspirin     Other reaction(s): Localized superficial swelling of skin uncoated asa-causes throat  to swell up  . Codeine Itching    Other reaction(s): Itching of Skin  . Fosamax  [Alendronate Sodium]     Other reaction(s): Distress (finding)  . Ibuprofen   . Ibuprofen     Other reaction(s): Distress (finding)  . Naproxen     Other reaction(s): Distress (finding)  . Naproxen Sodium   . Nsaids     GI UPSET / GERD  . Oxycodone     Other reaction(s): Diarrhea and vomiting (finding)  . Oxycodone-Acetaminophen     Other reaction(s): Diarrhea and vomiting (finding)    Current Medications: Current Outpatient Prescriptions  Medication Sig Dispense Refill  . Acetaminophen 500 MG coapsule Take 1,000 mg by mouth every 6 (six) hours as needed for fever.    . Calcium Carb-Cholecalciferol (CALCIUM 1000 + D PO) Take by mouth daily.      . DimenhyDRINATE (DRAMAMINE PO) Take by mouth.    . Multiple Vitamin (MULTIVITAMIN) tablet Take 1 tablet by mouth daily.      Marland Kitchen omeprazole (PRILOSEC) 20 MG capsule   0  . prochlorperazine (COMPAZINE) 10 MG tablet Take 10 mg by mouth 3 (three) times daily.  0  . ELIQUIS 5 MG TABS tablet Take 1 tablet (5 mg total) by mouth 2 (two) times daily. 60 tablet 0  . furosemide (LASIX) 20 MG tablet take 1 tablet by mouth once daily  30 tablet 5  . phenazopyridine (PYRIDIUM) 100 MG tablet Take 1 tablet (100 mg total) by mouth 3 (three) times daily as needed for pain. 21 tablet 0  . potassium chloride SA (K-DUR,KLOR-CON) 20 MEQ tablet Take 1 tablet (20 mEq total) by mouth 2 (two) times daily. 60 tablet 1  . topiramate (TOPAMAX) 25 MG tablet TAKE 1-2 TABLETS BY MOUTH DAILY 60 tablet 5  . [DISCONTINUED] dexlansoprazole (DEXILANT) 60 MG capsule Take 60 mg by mouth daily.       No current facility-administered medications for this visit.   Facility-Administered Medications Ordered in Other Visits  Medication Dose Route Frequency Provider Last Rate Last Dose  . fludeoxyglucose F - 18 (FDG) injection 12 milli Curie  12 milli Curie Intravenous Once PRN Medication Radiologist,  MD   12.85 milli Curie at 03/03/15 1035  . sodium chloride 0.9 % injection 10 mL  10 mL Intracatheter PRN Lloyd Huger, MD   10 mL at 10/27/14 1000    Review of Systems:  GENERAL:  Feels good today.  No fevers, sweats or weight loss. PERFORMANCE STATUS (ECOG):  2 HEENT:  No visual changes, runny nose, sore throat, mouth sores or tenderness. Lungs: No shortness of breath or cough.  No hemoptysis. Cardiac:  No chest pain, palpitations, orthopnea, or PND. GI:  No nausea, vomiting, diarrhea, constipation, melena or hematochezia. GU:  No urgency, frequency, dysuria, or hematuria. Musculoskeletal:  No back pain.  No joint pain.  No muscle tenderness. Extremities:  No pain or swelling. Skin:  No rashes or skin changes. Neuro:  No headache, numbness or weakness, balance or coordination issues. Endocrine:  No diabetes, thyroid issues, hot flashes or night sweats. Psych:  No mood changes, depression or anxiety. Pain:  No focal pain. Review of systems:  All other systems reviewed and found to be negative.   Physical Exam: Blood pressure 89/58, pulse 94, temperature 97.5 F (36.4 C), temperature source Oral, resp. rate 20, weight 132 lb (59.875 kg). GENERAL:  Well developed, well nourished woman sitting comfortably in a wheelchair in the exam room in no acute distress. MENTAL STATUS:  Alert and oriented to person, place and time. HEAD:  Styled gray hair.  Normocephalic, atraumatic, face symmetric, no Cushingoid features. EYES:  Blue eyes.  Pupils equal round and reactive to light and accomodation.  No conjunctivitis or scleral icterus. ENT:  Oropharynx clear without lesion.  Tongue normal. Mucous membranes moist.  RESPIRATORY:  Clear to auscultation without rales, wheezes or rhonchi. CARDIOVASCULAR:  Regular rate and rhythm without murmur, rub or gallop. ABDOMEN:  Soft, non-tender, with active bowel sounds, and no hepatosplenomegaly.  No masses. SKIN:  No rashes, ulcers or  lesions. EXTREMITIES: Dense lower extremity edema.  No skin discoloration or tenderness.  No palpable cords. LYMPH NODES: No palpable cervical, supraclavicular, axillary or inguinal adenopathy  NEUROLOGICAL: Unremarkable. PSYCH:  Appropriate.  Infusion on 12/01/2014  Component Date Value Ref Range Status  . WBC 12/01/2014 3.1* 3.6 - 11.0 K/uL Final   A-LINE DRAW  . RBC 12/01/2014 3.25* 3.80 - 5.20 MIL/uL Final  . Hemoglobin 12/01/2014 11.9* 12.0 - 16.0 g/dL Final  . HCT 12/01/2014 34.6* 35.0 - 47.0 % Final  . MCV 12/01/2014 106.5* 80.0 - 100.0 fL Final  . MCH 12/01/2014 36.7* 26.0 - 34.0 pg Final  . MCHC 12/01/2014 34.5  32.0 - 36.0 g/dL Final  . RDW 12/01/2014 13.2  11.5 - 14.5 % Final  . Platelets 12/01/2014 107* 150 - 440 K/uL Final  .  Neutrophils Relative % 12/01/2014 50   Final  . Neutro Abs 12/01/2014 1.6  1.4 - 6.5 K/uL Final  . Lymphocytes Relative 12/01/2014 39   Final  . Lymphs Abs 12/01/2014 1.2  1.0 - 3.6 K/uL Final  . Monocytes Relative 12/01/2014 8   Final  . Monocytes Absolute 12/01/2014 0.2  0.2 - 0.9 K/uL Final  . Eosinophils Relative 12/01/2014 3   Final  . Eosinophils Absolute 12/01/2014 0.1  0 - 0.7 K/uL Final  . Basophils Relative 12/01/2014 0   Final  . Basophils Absolute 12/01/2014 0.0  0 - 0.1 K/uL Final  . Sodium 12/01/2014 132* 135 - 145 mmol/L Final  . Potassium 12/01/2014 3.5  3.5 - 5.1 mmol/L Final  . Chloride 12/01/2014 104  101 - 111 mmol/L Final  . CO2 12/01/2014 23  22 - 32 mmol/L Final  . Glucose, Bld 12/01/2014 119* 65 - 99 mg/dL Final  . BUN 12/01/2014 17  6 - 20 mg/dL Final  . Creatinine, Ser 12/01/2014 0.94  0.44 - 1.00 mg/dL Final  . Calcium 12/01/2014 8.4* 8.9 - 10.3 mg/dL Final  . GFR calc non Af Amer 12/01/2014 59* >60 mL/min Final  . GFR calc Af Amer 12/01/2014 >60  >60 mL/min Final   Comment: (NOTE) The eGFR has been calculated using the CKD EPI equation. This calculation has not been validated in all clinical situations. eGFR's  persistently <60 mL/min signify possible Chronic Kidney Disease.   . Anion gap 12/01/2014 5  5 - 15 Final  . Folate 12/01/2014 27.0  >5.9 ng/mL Final  . TSH 12/01/2014 4.335  0.350 - 4.500 uIU/mL Final    Assessment:  Amanda Robbins is a 74 y.o. female with stage IV ovarian cancer with brain metastasis. PET scan on 11/19/2014 revealed interval development of several small mildly hypermetabolic abdominal pelvic node suspicious for metastatic disease.  She had a pulmonary embolism on 07/21/2014.  She is on Eliquis.  She has received 4 cycles of single agent carboplatin (last 10/27/2014). She developed a reaction to Taxol in the past.  Thrombocytopenia has resulted in delays in therapy.  Platelet count is > 100k today.  Exam is stable.  Plan: 1. Labs today:  CBC with diff, BMP, CA125, folate, TSH. 2. Cycle #5 carboplatin today.  3. RTC in 2 weeks for CBC with diff. 4. RTC in 4 weeks for MD (Dr. Grayland Ormond) assessment, labs (CBC with diff, CMP, Mg, CA125), and cycle #6 carboplatin.  Lequita Asal, MD  12/01/2014, 1:57 PM

## 2014-12-03 ENCOUNTER — Ambulatory Visit (INDEPENDENT_AMBULATORY_CARE_PROVIDER_SITE_OTHER): Payer: Medicare Other | Admitting: Internal Medicine

## 2014-12-03 ENCOUNTER — Encounter: Payer: Self-pay | Admitting: Internal Medicine

## 2014-12-03 VITALS — BP 89/56 | HR 69 | Temp 97.9°F | Ht 65.75 in | Wt 134.2 lb

## 2014-12-03 DIAGNOSIS — C569 Malignant neoplasm of unspecified ovary: Secondary | ICD-10-CM | POA: Diagnosis not present

## 2014-12-03 NOTE — Patient Instructions (Signed)
Follow up in 6 months and as needed.

## 2014-12-03 NOTE — Assessment & Plan Note (Signed)
Reviewed recent oncology notes. Pt tolerating Carboplatin well. Reviewed PET scan with her today. Follow up with oncology as scheduled.

## 2014-12-03 NOTE — Progress Notes (Signed)
   Subjective:    Patient ID: Amanda Robbins, female    DOB: 06/18/1940, 74 y.o.   MRN: [id number]  HPI  74YO female presents for follow up.  Recent PET scan showed small pos superclavicular node. Decision made to give 3 more treatments of chemotherapy with Carboplatin. Last treatment 6/21. Some treatments have been delayed because of thrombocytopenia. Next in 3 weeks. Tolerating treatment well. No vomiting. Some mild fatigue and dyspnea with exertion.  Started back on Miralax to help with constipation.   Wt Readings from Last 3 Encounters:  12/03/14 134 lb 4 oz (60.895 kg)  12/01/14 132 lb (59.875 kg)  11/24/14 133 lb 13.1 oz (60.7 kg)     Past medical, surgical, family and social history per today's encounter.  Review of Systems  Constitutional: Positive for fatigue. Negative for fever, chills, appetite change and unexpected weight change.  Eyes: Negative for visual disturbance.  Respiratory: Positive for shortness of breath.   Cardiovascular: Negative for chest pain and leg swelling.  Gastrointestinal: Negative for nausea, vomiting, abdominal pain, diarrhea and constipation.  Musculoskeletal: Positive for myalgias and arthralgias.  Skin: Negative for color change and rash.  Hematological: Negative for adenopathy. Does not bruise/bleed easily.  Psychiatric/Behavioral: Negative for sleep disturbance and dysphoric mood. The patient is not nervous/anxious.        Objective:    BP 89/56 mmHg  Pulse 69  Temp(Src) 97.9 F (36.6 C) (Oral)  Ht 5' 5.75" (1.67 m)  Wt 134 lb 4 oz (60.895 kg)  BMI 21.83 kg/m2  SpO2 100% Physical Exam  Constitutional: She is oriented to person, place, and time. She appears well-developed and well-nourished. No distress.  HENT:  Head: Normocephalic and atraumatic.  Right Ear: External ear normal.  Left Ear: External ear normal.  Nose: Nose normal.  Mouth/Throat: Oropharynx is clear and moist. No oropharyngeal exudate.  Eyes: Conjunctivae and  EOM are normal. Pupils are equal, round, and reactive to light. Right eye exhibits no discharge.  Neck: Normal range of motion. Neck supple. No thyromegaly present.  Cardiovascular: Normal rate, regular rhythm, normal heart sounds and intact distal pulses.  Exam reveals no gallop and no friction rub.   No murmur heard. Pulmonary/Chest: Effort normal. No respiratory distress. She has no wheezes. She has no rales.  Abdominal: Soft. Bowel sounds are normal. She exhibits no distension and no mass. There is no tenderness. There is no rebound and no guarding.  Musculoskeletal: Normal range of motion. She exhibits no edema or tenderness.  Lymphadenopathy:    She has no cervical adenopathy.  Neurological: She is alert and oriented to person, place, and time. No cranial nerve deficit. Coordination normal.  Skin: Skin is warm and dry. No rash noted. She is not diaphoretic. No erythema. No pallor.  Psychiatric: She has a normal mood and affect. Her behavior is normal. Judgment and thought content normal.          Assessment & Plan:  Over 30min of which >50% spent in face-to-face contact with patient discussing plan of care  Problem List Items Addressed This Visit      Unprioritized   Ovarian cancer - Primary    Reviewed recent oncology notes. Pt tolerating Carboplatin well. Reviewed PET scan with her today. Follow up with oncology as scheduled.          Return in about 6 months (around 06/04/2015) for Recheck.

## 2014-12-03 NOTE — Progress Notes (Signed)
Pre visit review using our clinic review tool, if applicable. No additional management support is needed unless otherwise documented below in the visit note. 

## 2014-12-15 ENCOUNTER — Inpatient Hospital Stay: Payer: Medicare Other | Attending: Oncology

## 2014-12-15 DIAGNOSIS — D6959 Other secondary thrombocytopenia: Secondary | ICD-10-CM | POA: Insufficient documentation

## 2014-12-15 DIAGNOSIS — C7931 Secondary malignant neoplasm of brain: Secondary | ICD-10-CM | POA: Insufficient documentation

## 2014-12-15 DIAGNOSIS — Z90710 Acquired absence of both cervix and uterus: Secondary | ICD-10-CM | POA: Insufficient documentation

## 2014-12-15 DIAGNOSIS — J449 Chronic obstructive pulmonary disease, unspecified: Secondary | ICD-10-CM | POA: Insufficient documentation

## 2014-12-15 DIAGNOSIS — Z7982 Long term (current) use of aspirin: Secondary | ICD-10-CM | POA: Insufficient documentation

## 2014-12-15 DIAGNOSIS — Z79899 Other long term (current) drug therapy: Secondary | ICD-10-CM | POA: Diagnosis not present

## 2014-12-15 DIAGNOSIS — D702 Other drug-induced agranulocytosis: Secondary | ICD-10-CM | POA: Insufficient documentation

## 2014-12-15 DIAGNOSIS — R600 Localized edema: Secondary | ICD-10-CM | POA: Insufficient documentation

## 2014-12-15 DIAGNOSIS — T451X5S Adverse effect of antineoplastic and immunosuppressive drugs, sequela: Secondary | ICD-10-CM | POA: Diagnosis not present

## 2014-12-15 DIAGNOSIS — D693 Immune thrombocytopenic purpura: Secondary | ICD-10-CM | POA: Diagnosis not present

## 2014-12-15 DIAGNOSIS — C569 Malignant neoplasm of unspecified ovary: Secondary | ICD-10-CM | POA: Diagnosis not present

## 2014-12-15 DIAGNOSIS — K589 Irritable bowel syndrome without diarrhea: Secondary | ICD-10-CM | POA: Insufficient documentation

## 2014-12-15 DIAGNOSIS — Z5111 Encounter for antineoplastic chemotherapy: Secondary | ICD-10-CM | POA: Insufficient documentation

## 2014-12-15 DIAGNOSIS — R5381 Other malaise: Secondary | ICD-10-CM | POA: Diagnosis not present

## 2014-12-15 DIAGNOSIS — R531 Weakness: Secondary | ICD-10-CM | POA: Diagnosis not present

## 2014-12-15 DIAGNOSIS — Z87891 Personal history of nicotine dependence: Secondary | ICD-10-CM | POA: Insufficient documentation

## 2014-12-15 DIAGNOSIS — M810 Age-related osteoporosis without current pathological fracture: Secondary | ICD-10-CM | POA: Insufficient documentation

## 2014-12-15 DIAGNOSIS — R5383 Other fatigue: Secondary | ICD-10-CM | POA: Diagnosis not present

## 2014-12-15 LAB — CBC WITH DIFFERENTIAL/PLATELET
BASOS ABS: 0 10*3/uL (ref 0–0.1)
Basophils Relative: 1 %
EOS ABS: 0 10*3/uL (ref 0–0.7)
Eosinophils Relative: 1 %
HCT: 37.1 % (ref 35.0–47.0)
Hemoglobin: 12.6 g/dL (ref 12.0–16.0)
LYMPHS ABS: 1.2 10*3/uL (ref 1.0–3.6)
Lymphocytes Relative: 42 %
MCH: 36.3 pg — ABNORMAL HIGH (ref 26.0–34.0)
MCHC: 34 g/dL (ref 32.0–36.0)
MCV: 106.8 fL — ABNORMAL HIGH (ref 80.0–100.0)
Monocytes Absolute: 0.2 10*3/uL (ref 0.2–0.9)
Monocytes Relative: 9 %
NEUTROS PCT: 47 %
Neutro Abs: 1.4 10*3/uL (ref 1.4–6.5)
PLATELETS: 102 10*3/uL — AB (ref 150–440)
RBC: 3.47 MIL/uL — AB (ref 3.80–5.20)
RDW: 12.8 % (ref 11.5–14.5)
WBC: 2.9 10*3/uL — AB (ref 3.6–11.0)

## 2014-12-15 LAB — BASIC METABOLIC PANEL
Anion gap: 5 (ref 5–15)
BUN: 22 mg/dL — AB (ref 6–20)
CALCIUM: 9 mg/dL (ref 8.9–10.3)
CO2: 24 mmol/L (ref 22–32)
Chloride: 105 mmol/L (ref 101–111)
Creatinine, Ser: 0.95 mg/dL (ref 0.44–1.00)
GFR calc Af Amer: >60 mL/min (ref >60)
GFR, EST NON AFRICAN AMERICAN: 58 mL/min — AB (ref >60)
Glucose, Bld: 98 mg/dL (ref 65–99)
POTASSIUM: 3.9 mmol/L (ref 3.5–5.1)
Sodium: 134 mmol/L — ABNORMAL LOW (ref 135–145)

## 2014-12-16 ENCOUNTER — Telehealth: Payer: Self-pay | Admitting: *Deleted

## 2014-12-16 ENCOUNTER — Other Ambulatory Visit: Payer: Self-pay | Admitting: *Deleted

## 2014-12-16 LAB — CA 125: CA 125: 38.7 U/mL — ABNORMAL HIGH (ref 0.0–38.1)

## 2014-12-16 MED ORDER — ELIQUIS 5 MG PO TABS: 5.0000 mg | ORAL_TABLET | Freq: Two times a day (BID) | ORAL | Status: DC

## 2014-12-16 NOTE — Telephone Encounter (Signed)
Refill for 1 month supply of eliquis sent to patients pharmacy.

## 2014-12-18 ENCOUNTER — Other Ambulatory Visit: Payer: Self-pay | Admitting: *Deleted

## 2014-12-18 MED ORDER — POTASSIUM CHLORIDE CRYS ER 20 MEQ PO TBCR: 20.0000 meq | EXTENDED_RELEASE_TABLET | Freq: Two times a day (BID) | ORAL | Status: DC

## 2014-12-18 NOTE — Telephone Encounter (Signed)
Please refill. Thanks!

## 2014-12-18 NOTE — Telephone Encounter (Signed)
E scribed, pt notified

## 2014-12-21 ENCOUNTER — Telehealth: Payer: Self-pay | Admitting: *Deleted

## 2014-12-21 ENCOUNTER — Inpatient Hospital Stay: Payer: Medicare Other | Admitting: *Deleted

## 2014-12-21 ENCOUNTER — Encounter: Payer: Self-pay | Admitting: Internal Medicine

## 2014-12-21 DIAGNOSIS — R6883 Chills (without fever): Secondary | ICD-10-CM

## 2014-12-21 DIAGNOSIS — R1030 Lower abdominal pain, unspecified: Secondary | ICD-10-CM

## 2014-12-21 DIAGNOSIS — C569 Malignant neoplasm of unspecified ovary: Secondary | ICD-10-CM

## 2014-12-21 DIAGNOSIS — Z5111 Encounter for antineoplastic chemotherapy: Secondary | ICD-10-CM | POA: Diagnosis not present

## 2014-12-21 LAB — CBC WITH DIFFERENTIAL/PLATELET
Basophils Absolute: 0 10*3/uL (ref 0–0.1)
Basophils Relative: 1 %
Eosinophils Absolute: 0 10*3/uL (ref 0–0.7)
Eosinophils Relative: 1 %
HEMATOCRIT: 38.4 % (ref 35.0–47.0)
HEMOGLOBIN: 13.2 g/dL (ref 12.0–16.0)
LYMPHS PCT: 21 %
Lymphs Abs: 0.6 10*3/uL — ABNORMAL LOW (ref 1.0–3.6)
MCH: 36.8 pg — ABNORMAL HIGH (ref 26.0–34.0)
MCHC: 34.3 g/dL (ref 32.0–36.0)
MCV: 107.3 fL — AB (ref 80.0–100.0)
MONO ABS: 0.1 10*3/uL — AB (ref 0.2–0.9)
Monocytes Relative: 4 %
NEUTROS PCT: 73 %
Neutro Abs: 2.2 10*3/uL (ref 1.4–6.5)
Platelets: 61 10*3/uL — ABNORMAL LOW (ref 150–440)
RBC: 3.57 MIL/uL — AB (ref 3.80–5.20)
RDW: 12.8 % (ref 11.5–14.5)
WBC: 2.9 10*3/uL — ABNORMAL LOW (ref 3.6–11.0)

## 2014-12-21 LAB — URINALYSIS COMPLETE WITH MICROSCOPIC (ARMC ONLY)
BILIRUBIN URINE: NEGATIVE
Bacteria, UA: NONE SEEN
Glucose, UA: NEGATIVE mg/dL
HGB URINE DIPSTICK: NEGATIVE
Ketones, ur: NEGATIVE mg/dL
Nitrite: NEGATIVE
Protein, ur: NEGATIVE mg/dL
SQUAMOUS EPITHELIAL / LPF: NONE SEEN
Specific Gravity, Urine: 1.008 (ref 1.005–1.030)
pH: 6 (ref 5.0–8.0)

## 2014-12-21 LAB — BASIC METABOLIC PANEL
Anion gap: 7 (ref 5–15)
BUN: 15 mg/dL (ref 6–20)
CALCIUM: 9 mg/dL (ref 8.9–10.3)
CO2: 25 mmol/L (ref 22–32)
CREATININE: 0.95 mg/dL (ref 0.44–1.00)
Chloride: 104 mmol/L (ref 101–111)
GFR calc non Af Amer: 58 mL/min — ABNORMAL LOW (ref >60)
Glucose, Bld: 129 mg/dL — ABNORMAL HIGH (ref 65–99)
POTASSIUM: 3.6 mmol/L (ref 3.5–5.1)
SODIUM: 136 mmol/L (ref 135–145)

## 2014-12-21 MED ORDER — LEVOFLOXACIN 500 MG PO TABS: 500.0000 mg | ORAL_TABLET | Freq: Every day | ORAL | Status: DC

## 2014-12-21 NOTE — Telephone Encounter (Signed)
Can come in for UA and Culture per Dr Grayland Ormond Pt states she is on her way

## 2014-12-21 NOTE — Telephone Encounter (Signed)
Reports that has not felt well sincde Sat, thinks she may have a UTI. Having lower abd pain going into her legs, Chills and shaking without fever. Asking if she needs to be seen here, she does have a call into her PMD office also

## 2014-12-22 ENCOUNTER — Telehealth: Payer: Self-pay | Admitting: *Deleted

## 2014-12-22 MED ORDER — PHENAZOPYRIDINE HCL 100 MG PO TABS: 100.0000 mg | ORAL_TABLET | Freq: Three times a day (TID) | ORAL | Status: DC | PRN

## 2014-12-22 NOTE — Telephone Encounter (Signed)
Asking what her results show

## 2014-12-23 LAB — URINE CULTURE

## 2014-12-23 NOTE — Telephone Encounter (Signed)
Notified that culture showsshe is on the correct medicine and to make sure she takes all of her rx adn to call back if needed

## 2014-12-29 ENCOUNTER — Inpatient Hospital Stay (HOSPITAL_BASED_OUTPATIENT_CLINIC_OR_DEPARTMENT_OTHER): Payer: Medicare Other | Admitting: Oncology

## 2014-12-29 ENCOUNTER — Inpatient Hospital Stay: Payer: Medicare Other

## 2014-12-29 VITALS — BP 123/72 | HR 90 | Temp 98.8°F | Resp 16 | Wt 127.6 lb

## 2014-12-29 DIAGNOSIS — R5383 Other fatigue: Secondary | ICD-10-CM

## 2014-12-29 DIAGNOSIS — D6959 Other secondary thrombocytopenia: Secondary | ICD-10-CM

## 2014-12-29 DIAGNOSIS — R531 Weakness: Secondary | ICD-10-CM

## 2014-12-29 DIAGNOSIS — Z7982 Long term (current) use of aspirin: Secondary | ICD-10-CM

## 2014-12-29 DIAGNOSIS — D702 Other drug-induced agranulocytosis: Secondary | ICD-10-CM | POA: Diagnosis not present

## 2014-12-29 DIAGNOSIS — R600 Localized edema: Secondary | ICD-10-CM

## 2014-12-29 DIAGNOSIS — T451X5S Adverse effect of antineoplastic and immunosuppressive drugs, sequela: Secondary | ICD-10-CM

## 2014-12-29 DIAGNOSIS — C569 Malignant neoplasm of unspecified ovary: Secondary | ICD-10-CM

## 2014-12-29 DIAGNOSIS — C7931 Secondary malignant neoplasm of brain: Secondary | ICD-10-CM | POA: Diagnosis not present

## 2014-12-29 DIAGNOSIS — J449 Chronic obstructive pulmonary disease, unspecified: Secondary | ICD-10-CM

## 2014-12-29 DIAGNOSIS — D693 Immune thrombocytopenic purpura: Secondary | ICD-10-CM

## 2014-12-29 DIAGNOSIS — R5381 Other malaise: Secondary | ICD-10-CM

## 2014-12-29 DIAGNOSIS — Z5111 Encounter for antineoplastic chemotherapy: Secondary | ICD-10-CM | POA: Diagnosis not present

## 2014-12-29 LAB — CBC WITH DIFFERENTIAL/PLATELET
Basophils Absolute: 0 10*3/uL (ref 0–0.1)
Basophils Relative: 1 %
EOS ABS: 0.1 10*3/uL (ref 0–0.7)
EOS PCT: 2 %
HCT: 33.5 % — ABNORMAL LOW (ref 35.0–47.0)
Hemoglobin: 11.5 g/dL — ABNORMAL LOW (ref 12.0–16.0)
Lymphocytes Relative: 32 %
Lymphs Abs: 1.2 10*3/uL (ref 1.0–3.6)
MCH: 36.4 pg — ABNORMAL HIGH (ref 26.0–34.0)
MCHC: 34.5 g/dL (ref 32.0–36.0)
MCV: 105.5 fL — AB (ref 80.0–100.0)
MONO ABS: 0.2 10*3/uL (ref 0.2–0.9)
Monocytes Relative: 5 %
NEUTROS ABS: 2.2 10*3/uL (ref 1.4–6.5)
NEUTROS PCT: 60 %
PLATELETS: 49 10*3/uL — AB (ref 150–440)
RBC: 3.17 MIL/uL — ABNORMAL LOW (ref 3.80–5.20)
RDW: 12.4 % (ref 11.5–14.5)
WBC: 3.6 10*3/uL (ref 3.6–11.0)

## 2014-12-29 LAB — BASIC METABOLIC PANEL
Anion gap: 6 (ref 5–15)
BUN: 20 mg/dL (ref 6–20)
CHLORIDE: 108 mmol/L (ref 101–111)
CO2: 25 mmol/L (ref 22–32)
Calcium: 8.9 mg/dL (ref 8.9–10.3)
Creatinine, Ser: 0.76 mg/dL (ref 0.44–1.00)
GFR calc Af Amer: >60 mL/min (ref >60)
Glucose, Bld: 99 mg/dL (ref 65–99)
Potassium: 3.6 mmol/L (ref 3.5–5.1)
SODIUM: 139 mmol/L (ref 135–145)

## 2014-12-29 MED ORDER — HEPARIN SOD (PORK) LOCK FLUSH 100 UNIT/ML IV SOLN
500.0000 [IU] | Freq: Once | INTRAVENOUS | Status: AC
  Administered 2014-12-29: 500 [IU] via INTRAVENOUS

## 2014-12-29 MED ORDER — SODIUM CHLORIDE 0.9 % IJ SOLN
10.0000 mL | INTRAMUSCULAR | Status: DC | PRN
  Administered 2014-12-29: 10 mL via INTRAVENOUS
  Filled 2014-12-29: qty 10

## 2014-12-29 MED ORDER — HEPARIN SOD (PORK) LOCK FLUSH 100 UNIT/ML IV SOLN
INTRAVENOUS | Status: AC
  Filled 2014-12-29: qty 5

## 2015-01-05 ENCOUNTER — Inpatient Hospital Stay: Payer: Medicare Other

## 2015-01-05 ENCOUNTER — Inpatient Hospital Stay (HOSPITAL_BASED_OUTPATIENT_CLINIC_OR_DEPARTMENT_OTHER): Payer: Medicare Other | Admitting: Oncology

## 2015-01-05 VITALS — BP 88/62 | HR 87 | Temp 97.2°F | Resp 20 | Wt 127.6 lb

## 2015-01-05 VITALS — BP 100/55 | HR 90 | Resp 18

## 2015-01-05 DIAGNOSIS — D693 Immune thrombocytopenic purpura: Secondary | ICD-10-CM

## 2015-01-05 DIAGNOSIS — T451X5S Adverse effect of antineoplastic and immunosuppressive drugs, sequela: Secondary | ICD-10-CM

## 2015-01-05 DIAGNOSIS — R531 Weakness: Secondary | ICD-10-CM

## 2015-01-05 DIAGNOSIS — C7931 Secondary malignant neoplasm of brain: Secondary | ICD-10-CM | POA: Diagnosis not present

## 2015-01-05 DIAGNOSIS — D702 Other drug-induced agranulocytosis: Secondary | ICD-10-CM | POA: Diagnosis not present

## 2015-01-05 DIAGNOSIS — R5383 Other fatigue: Secondary | ICD-10-CM

## 2015-01-05 DIAGNOSIS — R5381 Other malaise: Secondary | ICD-10-CM

## 2015-01-05 DIAGNOSIS — D6959 Other secondary thrombocytopenia: Secondary | ICD-10-CM

## 2015-01-05 DIAGNOSIS — R600 Localized edema: Secondary | ICD-10-CM

## 2015-01-05 DIAGNOSIS — Z5111 Encounter for antineoplastic chemotherapy: Secondary | ICD-10-CM | POA: Diagnosis not present

## 2015-01-05 DIAGNOSIS — C569 Malignant neoplasm of unspecified ovary: Secondary | ICD-10-CM | POA: Diagnosis not present

## 2015-01-05 DIAGNOSIS — J449 Chronic obstructive pulmonary disease, unspecified: Secondary | ICD-10-CM

## 2015-01-05 DIAGNOSIS — Z7982 Long term (current) use of aspirin: Secondary | ICD-10-CM

## 2015-01-05 LAB — BASIC METABOLIC PANEL
Anion gap: 5 (ref 5–15)
BUN: 16 mg/dL (ref 6–20)
CALCIUM: 8.9 mg/dL (ref 8.9–10.3)
CHLORIDE: 110 mmol/L (ref 101–111)
CO2: 24 mmol/L (ref 22–32)
CREATININE: 0.91 mg/dL (ref 0.44–1.00)
GFR calc Af Amer: >60 mL/min (ref >60)
Glucose, Bld: 130 mg/dL — ABNORMAL HIGH (ref 65–99)
POTASSIUM: 3.6 mmol/L (ref 3.5–5.1)
Sodium: 139 mmol/L (ref 135–145)

## 2015-01-05 LAB — CBC WITH DIFFERENTIAL/PLATELET
Basophils Absolute: 0 10*3/uL (ref 0–0.1)
Basophils Relative: 0 %
EOS PCT: 3 %
Eosinophils Absolute: 0.1 10*3/uL (ref 0–0.7)
HCT: 34 % — ABNORMAL LOW (ref 35.0–47.0)
HEMOGLOBIN: 11.7 g/dL — AB (ref 12.0–16.0)
Lymphocytes Relative: 42 %
Lymphs Abs: 1 10*3/uL (ref 1.0–3.6)
MCH: 36.4 pg — ABNORMAL HIGH (ref 26.0–34.0)
MCHC: 34.3 g/dL (ref 32.0–36.0)
MCV: 106.1 fL — ABNORMAL HIGH (ref 80.0–100.0)
MONO ABS: 0.2 10*3/uL (ref 0.2–0.9)
MONOS PCT: 8 %
NEUTROS PCT: 47 %
Neutro Abs: 1.1 10*3/uL — ABNORMAL LOW (ref 1.4–6.5)
PLATELETS: 107 10*3/uL — AB (ref 150–440)
RBC: 3.2 MIL/uL — ABNORMAL LOW (ref 3.80–5.20)
RDW: 13 % (ref 11.5–14.5)
WBC: 2.3 10*3/uL — ABNORMAL LOW (ref 3.6–11.0)

## 2015-01-05 MED ORDER — SODIUM CHLORIDE 0.9 % IV SOLN
Freq: Once | INTRAVENOUS | Status: AC
  Administered 2015-01-05: 15:00:00 via INTRAVENOUS
  Filled 2015-01-05: qty 8

## 2015-01-05 MED ORDER — SODIUM CHLORIDE 0.9 % IJ SOLN
10.0000 mL | INTRAMUSCULAR | Status: DC | PRN
  Administered 2015-01-05: 10 mL
  Filled 2015-01-05: qty 10

## 2015-01-05 MED ORDER — HEPARIN SOD (PORK) LOCK FLUSH 100 UNIT/ML IV SOLN
500.0000 [IU] | Freq: Once | INTRAVENOUS | Status: AC | PRN
  Administered 2015-01-05: 500 [IU]

## 2015-01-05 MED ORDER — SODIUM CHLORIDE 0.9 % IV SOLN
301.2000 mg | Freq: Once | INTRAVENOUS | Status: AC
  Administered 2015-01-05: 300 mg via INTRAVENOUS
  Filled 2015-01-05: qty 30

## 2015-01-05 MED ORDER — SODIUM CHLORIDE 0.9 % IV SOLN
Freq: Once | INTRAVENOUS | Status: AC
  Administered 2015-01-05: 15:00:00 via INTRAVENOUS
  Filled 2015-01-05: qty 1000

## 2015-01-10 NOTE — Progress Notes (Signed)
Amanda Robbins  [phone] [phone]) [phone]  ID: Amanda Robbins OB: 1941-04-19  MR#: [id number]  TKZ#:[id number]  Patient Care Team: Amanda Confer, MD as PCP - General (Internal Medicine)  CHIEF COMPLAINT:  Chief Complaint  Patient presents with  . Follow-up    ovarian cancer    INTERVAL HISTORY: Patient returns to clinic today for further evaluation and consideration of cycle 6 of single agent carboplatinum. She continues to feel weak and fatigued. She does not complain of peripheral neuropathy today. Her bilateral lower extremity edema is improved since going to lymphedema clinic. She denies any fevers or illnesses. She denies any chest pain or shortness of breath. She has a fair appetite and denies weight loss. She denies any nausea, vomiting, constipation, or diarrhea. Her urinary tract infection symptoms have resolved. Patient offers no further specific complaints.   REVIEW OF SYSTEMS:   Review of Systems  Constitutional: Positive for malaise/fatigue. Negative for weight loss.  Cardiovascular: Positive for leg swelling.  Genitourinary: Negative.   Neurological: Positive for weakness.    As per HPI. Otherwise, a complete review of systems is negatve.  PAST MEDICAL HISTORY: Past Medical History  Diagnosis Date  . 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 Irritable bowel   . C. difficile colitis 2011  . Allergic rhinitis     Followed by Dr. Donneta Romberg  . Esophageal reflux     Followed by GI physician, on Dexilant and Ranitidine  . Migraine   . 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 Osteoporosis 2009    on Reclast once yearly, Followed by Dr. Jefm Bryant  . Ovarian cancer   . COPD (chronic obstructive pulmonary disease)   . Thrombocytopenia     PAST SURGICAL HISTORY: Past Surgical History  Procedure Laterality Date  . Bil roken wrists    . Gallbladder removed    . Bilateral bunionectomies    . Cesarean section     . Abdominal hysterectomy    . Cholecystectomy      FAMILY HISTORY: Unchanged. No reported history of malignancy or chronic disease.     ADVANCED DIRECTIVES:    HEALTH MAINTENANCE: History  Substance Use Topics  . Smoking status: Former Smoker    Quit date: 06/12/1989  . Smokeless tobacco: Never Used  . Alcohol Use: No     Colonoscopy:  PAP:  Bone density:  Lipid panel:  Allergies  Allergen Reactions  . Aspirin     Other reaction(s): Localized superficial swelling of skin uncoated asa-causes throat to swell up  . Codeine Itching    Other reaction(s): Itching of Skin  . Fosamax  [Alendronate Sodium]     Other reaction(s): Distress (finding)  . Ibuprofen   . Ibuprofen     Other reaction(s): Distress (finding)  . Naproxen     Other reaction(s): Distress (finding)  . Naproxen Sodium   . Nsaids     GI UPSET / GERD  . Oxycodone     Other reaction(s): Diarrhea and vomiting (finding)  . Oxycodone-Acetaminophen     Other reaction(s): Diarrhea and vomiting (finding)    Current Outpatient Prescriptions  Medication Sig Dispense Refill  . Acetaminophen 500 MG coapsule Take 1,000 mg by mouth every 6 (six) hours as needed for fever.    . Calcium Carb-Cholecalciferol (CALCIUM 1000 + D PO) Take by mouth daily.      . DimenhyDRINATE (DRAMAMINE PO) Take by mouth.    Arne Cleveland 5 MG TABS  tablet Take 1 tablet (5 mg total) by mouth 2 (two) times daily. 60 tablet 0  . furosemide (LASIX) 20 MG tablet take 1 tablet by mouth once daily 30 tablet 5  . Multiple Vitamin (MULTIVITAMIN) tablet Take 1 tablet by mouth daily.      Marland Kitchen omeprazole (PRILOSEC) 20 MG capsule   0  . phenazopyridine (PYRIDIUM) 100 MG tablet Take 1 tablet (100 mg total) by mouth 3 (three) times daily as needed for pain. 21 tablet 0  . potassium chloride SA (K-DUR,KLOR-CON) 20 MEQ tablet Take 1 tablet (20 mEq total) by mouth 2 (two) times daily. 60 tablet 1  . prochlorperazine (COMPAZINE) 10 MG tablet Take 10 mg by  mouth 3 (three) times daily.  0  . topiramate (TOPAMAX) 25 MG tablet Take 1-2 tablets (25-50 mg total) by mouth daily. 60 tablet 5  . [DISCONTINUED] dexlansoprazole (DEXILANT) 60 MG capsule Take 60 mg by mouth daily.       No current facility-administered medications for this visit.   Facility-Administered Medications Ordered in Other Visits  Medication Dose Route Frequency Provider Last Rate Last Dose  . sodium chloride 0.9 % injection 10 mL  10 mL Intracatheter PRN Lloyd Huger, MD   10 mL at 10/27/14 1000    OBJECTIVE: Filed Vitals:   12/29/14 1436  BP: 123/72  Pulse: 90  Temp: 98.8 F (37.1 C)  Resp: 16     Body mass index is 20.76 kg/(m^2).    ECOG FS:2 - Symptomatic, <50% confined to bed  General: Well-developed, well-nourished, no acute distress. Sitting in a wheelchair. Eyes: anicteric sclera. Lungs: Clear to auscultation bilaterally. Heart: Regular rate and rhythm. No rubs, murmurs, or gallops. Abdomen: Soft, nontender, nondistended. No organomegaly noted, normoactive bowel sounds. Musculoskeletal: 2+ bilateral lower extremity edema. Neuro: Alert, answering all questions appropriately. Cranial nerves grossly intact. Skin: No rashes or petechiae noted. Psych: Normal affect.   LAB RESULTS:  Lab Results  Component Value Date   NA 139 01/05/2015   K 3.6 01/05/2015   CL 110 01/05/2015   CO2 24 01/05/2015   GLUCOSE 130* 01/05/2015   BUN 16 01/05/2015   CREATININE 0.91 01/05/2015   CALCIUM 8.9 01/05/2015   PROT 6.2* 10/13/2014   ALBUMIN 3.5 10/13/2014   AST 22 10/13/2014   ALT 12* 10/13/2014   ALKPHOS 48 10/13/2014   BILITOT 0.7 10/13/2014   GFRNONAA >60 01/05/2015   GFRAA >60 01/05/2015    Lab Results  Component Value Date   WBC 2.3* 01/05/2015   NEUTROABS 1.1* 01/05/2015   HGB 11.7* 01/05/2015   HCT 34.0* 01/05/2015   MCV 106.1* 01/05/2015   PLT 107* 01/05/2015     STUDIES: No results found.  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  PLAN:    1. Ovarian cancer: PET scan results reviewed independently from November 19, 2014 reported mild progression of disease. Despite this, continue single agent carboplatinum for a total of 8 cycles. Delay cycle 6 of single agent carboplatinum today secondary to thrombocytopenia. Taxol was discontinued secondary to a reaction. Can consider adding Taxotere in the future if her performance status improves. Because of thrombocytopenia, patient can only receive infusions every 4-5 weeks. Return to clinic in 1 week for reconsideration of cycle 6. If patient's platelet count improves to greater than 80 or 90 can proceed with treatment. Patient expressed understanding and was in agreement with this plan. 2. Peripheral edema: Likely multifactorial. Continue diuretic as prescribed, monitor. Continue  recommendations per lymphedema clinic.  3. Weakness and fatigue/decreased performance status: Secondary to chemotherapy as well as malignancy. Monitor. 4. Thrombocytopenia: Secondary to chemotherapy.  5. Leukopenia: Secondary to chemotherapy. Monitor.  6. UTI: Resolved. Patient completed her course of anti-biotics.  Patient expressed understanding and was in agreement with this plan. She also understands that She can call clinic at any time with any questions, concerns, or complaints.    Lloyd Huger, MD   01/10/2015 8:43 AM

## 2015-01-10 NOTE — Progress Notes (Signed)
[place]  Telephone:(336) [phone] Fax:(336) [phone]  ID: Amanda Robbins OB: 10/31/1940  MR#: [id number]  WNU#:[id number]  Patient Care Team: Jackolyn Confer, MD as PCP - General (Internal Medicine)  CHIEF COMPLAINT:  Chief Complaint  Patient presents with  . Follow-up    ovarian cancer  . Chemotherapy    INTERVAL HISTORY: Patient returns to clinic today for further evaluation and reconsideration of cycle 6 of single agent carboplatinum. She continues to feel weak and fatigued. She does not complain of peripheral neuropathy today. Her bilateral lower extremity edema is improved since going to lymphedema clinic. She denies any fevers or illnesses. She denies any chest pain or shortness of breath. She has a fair appetite and denies weight loss. She denies any nausea, vomiting, constipation, or diarrhea. Patient offers no further specific complaints.   REVIEW OF SYSTEMS:   Review of Systems  Constitutional: Positive for malaise/fatigue. Negative for weight loss.  Cardiovascular: Positive for leg swelling.  Genitourinary: Negative.   Neurological: Positive for weakness.    As per HPI. Otherwise, a complete review of systems is negatve.  PAST MEDICAL HISTORY: Past Medical History  Diagnosis Date  . 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 Irritable bowel   . C. difficile colitis 2011  . Allergic rhinitis     Followed by Dr. Donneta Romberg  . Esophageal reflux     Followed by GI physician, on Dexilant and Ranitidine  . Migraine   . 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 Osteoporosis 2009    on Reclast once yearly, Followed by Dr. Jefm Bryant  . Ovarian cancer   . COPD (chronic obstructive pulmonary disease)   . Thrombocytopenia     PAST SURGICAL HISTORY: Past Surgical History  Procedure Laterality Date  . Bil roken wrists    . Gallbladder removed    . Bilateral bunionectomies    . Cesarean section    . Abdominal hysterectomy      . Cholecystectomy      FAMILY HISTORY: Unchanged. No reported history of malignancy or chronic disease.     ADVANCED DIRECTIVES:    HEALTH MAINTENANCE: History  Substance Use Topics  . Smoking status: Former Smoker    Quit date: 06/12/1989  . Smokeless tobacco: Never Used  . Alcohol Use: No     Colonoscopy:  PAP:  Bone density:  Lipid panel:  Allergies  Allergen Reactions  . Aspirin     Other reaction(s): Localized superficial swelling of skin uncoated asa-causes throat to swell up  . Codeine Itching    Other reaction(s): Itching of Skin  . Fosamax  [Alendronate Sodium]     Other reaction(s): Distress (finding)  . Ibuprofen   . Ibuprofen     Other reaction(s): Distress (finding)  . Naproxen     Other reaction(s): Distress (finding)  . Naproxen Sodium   . Nsaids     GI UPSET / GERD  . Oxycodone     Other reaction(s): Diarrhea and vomiting (finding)  . Oxycodone-Acetaminophen     Other reaction(s): Diarrhea and vomiting (finding)    Current Outpatient Prescriptions  Medication Sig Dispense Refill  . Acetaminophen 500 MG coapsule Take 1,000 mg by mouth every 6 (six) hours as needed for fever.    . Calcium Carb-Cholecalciferol (CALCIUM 1000 + D PO) Take by mouth daily.      . DimenhyDRINATE (DRAMAMINE PO) Take by mouth.    Arne Cleveland 5 MG TABS tablet Take 1  tablet (5 mg total) by mouth 2 (two) times daily. 60 tablet 0  . furosemide (LASIX) 20 MG tablet take 1 tablet by mouth once daily 30 tablet 5  . Multiple Vitamin (MULTIVITAMIN) tablet Take 1 tablet by mouth daily.      . phenazopyridine (PYRIDIUM) 100 MG tablet Take 1 tablet (100 mg total) by mouth 3 (three) times daily as needed for pain. 21 tablet 0  . potassium chloride SA (K-DUR,KLOR-CON) 20 MEQ tablet Take 1 tablet (20 mEq total) by mouth 2 (two) times daily. 60 tablet 1  . prochlorperazine (COMPAZINE) 10 MG tablet Take 10 mg by mouth 3 (three) times daily.  0  . topiramate (TOPAMAX) 25 MG tablet Take  1-2 tablets (25-50 mg total) by mouth daily. 60 tablet 5  . omeprazole (PRILOSEC) 20 MG capsule   0  . [DISCONTINUED] dexlansoprazole (DEXILANT) 60 MG capsule Take 60 mg by mouth daily.       No current facility-administered medications for this visit.   Facility-Administered Medications Ordered in Other Visits  Medication Dose Route Frequency Provider Last Rate Last Dose  . sodium chloride 0.9 % injection 10 mL  10 mL Intracatheter PRN Lloyd Huger, MD   10 mL at 10/27/14 1000    OBJECTIVE: Filed Vitals:   01/05/15 1412  BP: 88/62  Pulse: 87  Temp: 97.2 F (36.2 C)  Resp: 20     Body mass index is 20.76 kg/(m^2).    ECOG FS:2 - Symptomatic, <50% confined to bed  General: Well-developed, well-nourished, no acute distress. Sitting in a wheelchair. Eyes: anicteric sclera. Lungs: Clear to auscultation bilaterally. Heart: Regular rate and rhythm. No rubs, murmurs, or gallops. Abdomen: Soft, nontender, nondistended. No organomegaly noted, normoactive bowel sounds. Musculoskeletal: 2+ bilateral lower extremity edema. Neuro: Alert, answering all questions appropriately. Cranial nerves grossly intact. Skin: No rashes or petechiae noted. Psych: Normal affect.   LAB RESULTS:  Lab Results  Component Value Date   NA 139 01/05/2015   K 3.6 01/05/2015   CL 110 01/05/2015   CO2 24 01/05/2015   GLUCOSE 130* 01/05/2015   BUN 16 01/05/2015   CREATININE 0.91 01/05/2015   CALCIUM 8.9 01/05/2015   PROT 6.2* 10/13/2014   ALBUMIN 3.5 10/13/2014   AST 22 10/13/2014   ALT 12* 10/13/2014   ALKPHOS 48 10/13/2014   BILITOT 0.7 10/13/2014   GFRNONAA >60 01/05/2015   GFRAA >60 01/05/2015    Lab Results  Component Value Date   WBC 2.3* 01/05/2015   NEUTROABS 1.1* 01/05/2015   HGB 11.7* 01/05/2015   HCT 34.0* 01/05/2015   MCV 106.1* 01/05/2015   PLT 107* 01/05/2015     STUDIES: No results found.  ASSESSMENT: Stage IV ovarian cancer with brain metastasis, weakness and  fatigue.  PLAN:    1. Ovarian cancer: PET scan results reviewed independently from November 19, 2014 reported mild progression of disease. Despite this, continue single agent carboplatinum for a total of 8 cycles. Proceed with cycle 6 of single agent carboplatinum today. Taxol was discontinued secondary to a reaction. Can consider adding Taxotere in the future if her performance status improves. Because of thrombocytopenia, patient can only receive infusions every 4-5 weeks. Return to clinic in 2 weeks for laboratory work and then in 4 weeks for consideration of cycle 7. Plan to reimage after cycle 8.  Patient expressed understanding and was in agreement with this plan. 2. Peripheral edema: Likely multifactorial. Continue diuretic as prescribed, monitor. Continue recommendations per lymphedema clinic.  3. Weakness and fatigue/decreased performance status: Secondary to chemotherapy as well as malignancy. Monitor. 4. Thrombocytopenia: Secondary to chemotherapy.  5. Leukopenia: Secondary to chemotherapy. Monitor.  6. UTI: Resolved. Patient completed her course of anti-biotics.  Patient expressed understanding and was in agreement with this plan. She also understands that She can call clinic at any time with any questions, concerns, or complaints.    Lloyd Huger, MD   01/10/2015 8:47 AM

## 2015-01-12 ENCOUNTER — Other Ambulatory Visit: Payer: Self-pay | Admitting: Internal Medicine

## 2015-01-16 ENCOUNTER — Other Ambulatory Visit: Payer: Self-pay | Admitting: Internal Medicine

## 2015-01-19 ENCOUNTER — Inpatient Hospital Stay: Payer: Medicare Other | Attending: Oncology

## 2015-01-19 DIAGNOSIS — C569 Malignant neoplasm of unspecified ovary: Secondary | ICD-10-CM | POA: Diagnosis not present

## 2015-01-19 DIAGNOSIS — K589 Irritable bowel syndrome without diarrhea: Secondary | ICD-10-CM | POA: Insufficient documentation

## 2015-01-19 DIAGNOSIS — D693 Immune thrombocytopenic purpura: Secondary | ICD-10-CM | POA: Insufficient documentation

## 2015-01-19 DIAGNOSIS — R5383 Other fatigue: Secondary | ICD-10-CM | POA: Diagnosis not present

## 2015-01-19 DIAGNOSIS — J449 Chronic obstructive pulmonary disease, unspecified: Secondary | ICD-10-CM | POA: Diagnosis not present

## 2015-01-19 DIAGNOSIS — K219 Gastro-esophageal reflux disease without esophagitis: Secondary | ICD-10-CM | POA: Diagnosis not present

## 2015-01-19 DIAGNOSIS — Z79899 Other long term (current) drug therapy: Secondary | ICD-10-CM | POA: Diagnosis not present

## 2015-01-19 DIAGNOSIS — R531 Weakness: Secondary | ICD-10-CM | POA: Insufficient documentation

## 2015-01-19 DIAGNOSIS — R600 Localized edema: Secondary | ICD-10-CM | POA: Insufficient documentation

## 2015-01-19 DIAGNOSIS — T451X5S Adverse effect of antineoplastic and immunosuppressive drugs, sequela: Secondary | ICD-10-CM | POA: Diagnosis not present

## 2015-01-19 DIAGNOSIS — Z7901 Long term (current) use of anticoagulants: Secondary | ICD-10-CM | POA: Insufficient documentation

## 2015-01-19 DIAGNOSIS — Z5111 Encounter for antineoplastic chemotherapy: Secondary | ICD-10-CM | POA: Diagnosis not present

## 2015-01-19 DIAGNOSIS — Z87891 Personal history of nicotine dependence: Secondary | ICD-10-CM | POA: Insufficient documentation

## 2015-01-19 DIAGNOSIS — C7931 Secondary malignant neoplasm of brain: Secondary | ICD-10-CM | POA: Diagnosis not present

## 2015-01-19 DIAGNOSIS — D701 Agranulocytosis secondary to cancer chemotherapy: Secondary | ICD-10-CM | POA: Diagnosis not present

## 2015-01-19 LAB — CBC WITH DIFFERENTIAL/PLATELET
BASOS ABS: 0 10*3/uL (ref 0–0.1)
BASOS PCT: 1 %
EOS PCT: 1 %
Eosinophils Absolute: 0 10*3/uL (ref 0–0.7)
HCT: 34.8 % — ABNORMAL LOW (ref 35.0–47.0)
HEMOGLOBIN: 12.1 g/dL (ref 12.0–16.0)
LYMPHS ABS: 1.2 10*3/uL (ref 1.0–3.6)
LYMPHS PCT: 43 %
MCH: 36.5 pg — ABNORMAL HIGH (ref 26.0–34.0)
MCHC: 34.8 g/dL (ref 32.0–36.0)
MCV: 105.2 fL — ABNORMAL HIGH (ref 80.0–100.0)
MONO ABS: 0.3 10*3/uL (ref 0.2–0.9)
MONOS PCT: 12 %
NEUTROS ABS: 1.2 10*3/uL — AB (ref 1.4–6.5)
Neutrophils Relative %: 43 %
PLATELETS: 87 10*3/uL — AB (ref 150–440)
RBC: 3.31 MIL/uL — ABNORMAL LOW (ref 3.80–5.20)
RDW: 13.7 % (ref 11.5–14.5)
WBC: 2.8 10*3/uL — ABNORMAL LOW (ref 3.6–11.0)

## 2015-01-19 LAB — BASIC METABOLIC PANEL
Anion gap: 4 — ABNORMAL LOW (ref 5–15)
BUN: 18 mg/dL (ref 6–20)
CALCIUM: 9.1 mg/dL (ref 8.9–10.3)
CO2: 24 mmol/L (ref 22–32)
CREATININE: 0.8 mg/dL (ref 0.44–1.00)
Chloride: 107 mmol/L (ref 101–111)
GFR calc non Af Amer: >60 mL/min (ref >60)
Glucose, Bld: 105 mg/dL — ABNORMAL HIGH (ref 65–99)
Potassium: 4.4 mmol/L (ref 3.5–5.1)
Sodium: 135 mmol/L (ref 135–145)

## 2015-01-20 ENCOUNTER — Other Ambulatory Visit: Payer: Self-pay | Admitting: *Deleted

## 2015-01-20 LAB — CA 125: CA 125: 35.4 U/mL (ref 0.0–38.1)

## 2015-01-20 MED ORDER — ELIQUIS 5 MG PO TABS: 5.0000 mg | ORAL_TABLET | Freq: Two times a day (BID) | ORAL | Status: DC

## 2015-02-02 ENCOUNTER — Inpatient Hospital Stay: Payer: Medicare Other

## 2015-02-02 ENCOUNTER — Telehealth: Payer: Self-pay

## 2015-02-02 ENCOUNTER — Inpatient Hospital Stay (HOSPITAL_BASED_OUTPATIENT_CLINIC_OR_DEPARTMENT_OTHER): Payer: Medicare Other | Admitting: Oncology

## 2015-02-02 VITALS — BP 102/61 | HR 73 | Temp 97.0°F | Resp 18

## 2015-02-02 DIAGNOSIS — C569 Malignant neoplasm of unspecified ovary: Secondary | ICD-10-CM | POA: Diagnosis not present

## 2015-02-02 DIAGNOSIS — K219 Gastro-esophageal reflux disease without esophagitis: Secondary | ICD-10-CM

## 2015-02-02 DIAGNOSIS — D701 Agranulocytosis secondary to cancer chemotherapy: Secondary | ICD-10-CM | POA: Diagnosis not present

## 2015-02-02 DIAGNOSIS — Z5111 Encounter for antineoplastic chemotherapy: Secondary | ICD-10-CM | POA: Diagnosis not present

## 2015-02-02 DIAGNOSIS — R600 Localized edema: Secondary | ICD-10-CM

## 2015-02-02 DIAGNOSIS — C7931 Secondary malignant neoplasm of brain: Secondary | ICD-10-CM | POA: Diagnosis not present

## 2015-02-02 DIAGNOSIS — K589 Irritable bowel syndrome without diarrhea: Secondary | ICD-10-CM

## 2015-02-02 DIAGNOSIS — R531 Weakness: Secondary | ICD-10-CM

## 2015-02-02 DIAGNOSIS — J449 Chronic obstructive pulmonary disease, unspecified: Secondary | ICD-10-CM

## 2015-02-02 DIAGNOSIS — Z79899 Other long term (current) drug therapy: Secondary | ICD-10-CM

## 2015-02-02 DIAGNOSIS — T451X5S Adverse effect of antineoplastic and immunosuppressive drugs, sequela: Secondary | ICD-10-CM

## 2015-02-02 DIAGNOSIS — R5383 Other fatigue: Secondary | ICD-10-CM

## 2015-02-02 DIAGNOSIS — D693 Immune thrombocytopenic purpura: Secondary | ICD-10-CM

## 2015-02-02 LAB — CBC WITH DIFFERENTIAL/PLATELET
BASOS ABS: 0 10*3/uL (ref 0–0.1)
BASOS PCT: 0 %
EOS ABS: 0.1 10*3/uL (ref 0–0.7)
EOS PCT: 3 %
HCT: 34.9 % — ABNORMAL LOW (ref 35.0–47.0)
Hemoglobin: 12.2 g/dL (ref 12.0–16.0)
Lymphocytes Relative: 34 %
Lymphs Abs: 1.2 10*3/uL (ref 1.0–3.6)
MCH: 36.5 pg — ABNORMAL HIGH (ref 26.0–34.0)
MCHC: 35.1 g/dL (ref 32.0–36.0)
MCV: 103.9 fL — ABNORMAL HIGH (ref 80.0–100.0)
MONO ABS: 0.2 10*3/uL (ref 0.2–0.9)
Monocytes Relative: 5 %
Neutro Abs: 2 10*3/uL (ref 1.4–6.5)
Neutrophils Relative %: 58 %
PLATELETS: 78 10*3/uL — AB (ref 150–440)
RBC: 3.36 MIL/uL — AB (ref 3.80–5.20)
RDW: 13.3 % (ref 11.5–14.5)
WBC: 3.5 10*3/uL — AB (ref 3.6–11.0)

## 2015-02-02 LAB — BASIC METABOLIC PANEL
ANION GAP: 3 — AB (ref 5–15)
BUN: 20 mg/dL (ref 6–20)
CALCIUM: 8.8 mg/dL — AB (ref 8.9–10.3)
CO2: 23 mmol/L (ref 22–32)
Chloride: 109 mmol/L (ref 101–111)
Creatinine, Ser: 0.91 mg/dL (ref 0.44–1.00)
GLUCOSE: 114 mg/dL — AB (ref 65–99)
Potassium: 3.6 mmol/L (ref 3.5–5.1)
SODIUM: 135 mmol/L (ref 135–145)

## 2015-02-02 MED ORDER — CARBOPLATIN CHEMO INJECTION 450 MG/45ML
301.2000 mg | Freq: Once | INTRAVENOUS | Status: AC
  Administered 2015-02-02: 300 mg via INTRAVENOUS
  Filled 2015-02-02: qty 30

## 2015-02-02 MED ORDER — SODIUM CHLORIDE 0.9 % IV SOLN
Freq: Once | INTRAVENOUS | Status: AC
  Administered 2015-02-02: 15:00:00 via INTRAVENOUS
  Filled 2015-02-02: qty 1000

## 2015-02-02 MED ORDER — HEPARIN SOD (PORK) LOCK FLUSH 100 UNIT/ML IV SOLN
500.0000 [IU] | Freq: Once | INTRAVENOUS | Status: AC | PRN
  Administered 2015-02-02: 500 [IU]
  Filled 2015-02-02: qty 5

## 2015-02-02 MED ORDER — SODIUM CHLORIDE 0.9 % IV SOLN
Freq: Once | INTRAVENOUS | Status: AC
  Administered 2015-02-02: 15:00:00 via INTRAVENOUS
  Filled 2015-02-02: qty 8

## 2015-02-02 NOTE — Telephone Encounter (Signed)
Plt =78.  MD ok to treat today

## 2015-02-02 NOTE — Progress Notes (Signed)
Patient has neuropathy in her feet that seems to be getting worse.  She has not been sleeping well at night, especially same night as treatment, does have Ambien at home but does not want to take every night so only took occasion 1/2 tab.

## 2015-02-09 NOTE — Progress Notes (Signed)
[place]  Telephone:(336) [phone] Fax:(336) [phone]  ID: Amanda Robbins OB: 03/12/1941  MR#: [id number]  QIW#:[id number]  Patient Care Team: Jackolyn Confer, MD as PCP - General (Internal Medicine)  CHIEF COMPLAINT:  Chief Complaint  Patient presents with  . Follow-up    Ovarian cancer    INTERVAL HISTORY: Patient returns to clinic today for further evaluation and consideration of cycle 7 of single agent carboplatinum. She continues to feel weak and fatigued. She does not complain of peripheral neuropathy today. Her bilateral lower extremity edema is improved since going to lymphedema clinic. She denies any fevers or illnesses. She denies any chest pain or shortness of breath. She has a fair appetite and denies weight loss. She denies any nausea, vomiting, constipation, or diarrhea. Patient offers no further specific complaints.   REVIEW OF SYSTEMS:   Review of Systems  Constitutional: Positive for malaise/fatigue. Negative for weight loss.  Cardiovascular: Positive for leg swelling.  Genitourinary: Negative.   Neurological: Positive for weakness.    As per HPI. Otherwise, a complete review of systems is negatve.  PAST MEDICAL HISTORY: Past Medical History  Diagnosis Date  . 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 Irritable bowel   . C. difficile colitis 2011  . Allergic rhinitis     Followed by Dr. Donneta Romberg  . Esophageal reflux     Followed by GI physician, on Dexilant and Ranitidine  . Migraine   . 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 Osteoporosis 2009    on Reclast once yearly, Followed by Dr. Jefm Bryant  . Ovarian cancer   . COPD (chronic obstructive pulmonary disease)   . Thrombocytopenia     PAST SURGICAL HISTORY: Past Surgical History  Procedure Laterality Date  . Bil roken wrists    . Gallbladder removed    . Bilateral bunionectomies    . Cesarean section    . Abdominal hysterectomy    . Cholecystectomy       FAMILY HISTORY: Unchanged. No reported history of malignancy or chronic disease.     ADVANCED DIRECTIVES:    HEALTH MAINTENANCE: Social History  Substance Use Topics  . Smoking status: Former Smoker    Quit date: 06/12/1989  . Smokeless tobacco: Never Used  . Alcohol Use: No     Colonoscopy:  PAP:  Bone density:  Lipid panel:  Allergies  Allergen Reactions  . Aspirin     Other reaction(s): Localized superficial swelling of skin uncoated asa-causes throat to swell up  . Codeine Itching    Other reaction(s): Itching of Skin  . Fosamax  [Alendronate Sodium]     Other reaction(s): Distress (finding)  . Ibuprofen   . Ibuprofen     Other reaction(s): Distress (finding)  . Naproxen     Other reaction(s): Distress (finding)  . Naproxen Sodium   . Nsaids     GI UPSET / GERD  . Oxycodone     Other reaction(s): Diarrhea and vomiting (finding)  . Oxycodone-Acetaminophen     Other reaction(s): Diarrhea and vomiting (finding)    Current Outpatient Prescriptions  Medication Sig Dispense Refill  . Acetaminophen 500 MG coapsule Take 1,000 mg by mouth every 6 (six) hours as needed for fever.    . Calcium Carb-Cholecalciferol (CALCIUM 1000 + D PO) Take by mouth daily.      . DimenhyDRINATE (DRAMAMINE PO) Take by mouth.    Arne Cleveland 5 MG TABS tablet Take 1 tablet (5 mg  total) by mouth 2 (two) times daily. 60 tablet 0  . furosemide (LASIX) 20 MG tablet take 1 tablet by mouth once daily 30 tablet 5  . Multiple Vitamin (MULTIVITAMIN) tablet Take 1 tablet by mouth daily.      Marland Kitchen omeprazole (PRILOSEC) 20 MG capsule   0  . phenazopyridine (PYRIDIUM) 100 MG tablet Take 1 tablet (100 mg total) by mouth 3 (three) times daily as needed for pain. 21 tablet 0  . potassium chloride SA (K-DUR,KLOR-CON) 20 MEQ tablet Take 1 tablet (20 mEq total) by mouth 2 (two) times daily. 60 tablet 1  . prochlorperazine (COMPAZINE) 10 MG tablet Take 10 mg by mouth 3 (three) times daily.  0  . topiramate  (TOPAMAX) 25 MG tablet Take 1-2 tablets (25-50 mg total) by mouth daily. 60 tablet 5  . [DISCONTINUED] dexlansoprazole (DEXILANT) 60 MG capsule Take 60 mg by mouth daily.       No current facility-administered medications for this visit.   Facility-Administered Medications Ordered in Other Visits  Medication Dose Route Frequency Provider Last Rate Last Dose  . sodium chloride 0.9 % injection 10 mL  10 mL Intracatheter PRN Lloyd Huger, MD   10 mL at 10/27/14 1000    OBJECTIVE: There were no vitals filed for this visit.   There is no weight on file to calculate BMI.    ECOG FS:2 - Symptomatic, <50% confined to bed  General: Well-developed, well-nourished, no acute distress. Sitting in a wheelchair. Eyes: anicteric sclera. Lungs: Clear to auscultation bilaterally. Heart: Regular rate and rhythm. No rubs, murmurs, or gallops. Abdomen: Soft, nontender, nondistended. No organomegaly noted, normoactive bowel sounds. Musculoskeletal: 2+ bilateral lower extremity edema. Neuro: Alert, answering all questions appropriately. Cranial nerves grossly intact. Skin: No rashes or petechiae noted. Psych: Normal affect.   LAB RESULTS:  Lab Results  Component Value Date   NA 135 02/02/2015   K 3.6 02/02/2015   CL 109 02/02/2015   CO2 23 02/02/2015   GLUCOSE 114* 02/02/2015   BUN 20 02/02/2015   CREATININE 0.91 02/02/2015   CALCIUM 8.8* 02/02/2015   PROT 6.2* 10/13/2014   ALBUMIN 3.5 10/13/2014   AST 22 10/13/2014   ALT 12* 10/13/2014   ALKPHOS 48 10/13/2014   BILITOT 0.7 10/13/2014   GFRNONAA >60 02/02/2015   GFRAA >60 02/02/2015    Lab Results  Component Value Date   WBC 3.5* 02/02/2015   NEUTROABS 2.0 02/02/2015   HGB 12.2 02/02/2015   HCT 34.9* 02/02/2015   MCV 103.9* 02/02/2015   PLT 78* 02/02/2015     STUDIES: No results found.  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  PLAN:    1. Ovarian cancer: PET scan results reviewed  independently from November 19, 2014 reported mild progression of disease. CA-125 is within normal limits. Despite this, continue single agent carboplatinum. Will proceed with cycle 7 of single agent carboplatinum today despite her thrombocytopenia. Taxol was discontinued secondary to a reaction. Can consider adding Taxotere in the future if her performance status improves. Because of thrombocytopenia, patient can only receive infusions every 4-5 weeks. Return to clinic in 2 weeks for laboratory work and then in 4 weeks for consideration of cycle 8. Will reimage prior to next infusion.  Patient expressed understanding and was in agreement with this plan. 2. Peripheral edema: Likely multifactorial. Continue diuretic as prescribed, monitor. Continue recommendations per lymphedema clinic.  3. Weakness and fatigue/decreased performance status: Secondary to chemotherapy as well as  malignancy. Monitor. 4. Thrombocytopenia: Secondary to chemotherapy.  5. Leukopenia: Secondary to chemotherapy. Monitor.  6. UTI: Resolved. Patient completed her course of anti-biotics.  Patient expressed understanding and was in agreement with this plan. She also understands that She can call clinic at any time with any questions, concerns, or complaints.    Lloyd Huger, MD   02/09/2015 1:56 PM

## 2015-02-16 ENCOUNTER — Other Ambulatory Visit: Payer: Self-pay | Admitting: *Deleted

## 2015-02-16 ENCOUNTER — Other Ambulatory Visit: Payer: Medicare Other

## 2015-02-16 ENCOUNTER — Inpatient Hospital Stay: Payer: Medicare Other | Attending: Oncology

## 2015-02-16 DIAGNOSIS — Z9221 Personal history of antineoplastic chemotherapy: Secondary | ICD-10-CM | POA: Diagnosis not present

## 2015-02-16 DIAGNOSIS — T451X5S Adverse effect of antineoplastic and immunosuppressive drugs, sequela: Secondary | ICD-10-CM | POA: Diagnosis not present

## 2015-02-16 DIAGNOSIS — M810 Age-related osteoporosis without current pathological fracture: Secondary | ICD-10-CM | POA: Diagnosis not present

## 2015-02-16 DIAGNOSIS — Z7901 Long term (current) use of anticoagulants: Secondary | ICD-10-CM | POA: Insufficient documentation

## 2015-02-16 DIAGNOSIS — Z888 Allergy status to other drugs, medicaments and biological substances status: Secondary | ICD-10-CM | POA: Insufficient documentation

## 2015-02-16 DIAGNOSIS — C569 Malignant neoplasm of unspecified ovary: Secondary | ICD-10-CM | POA: Diagnosis present

## 2015-02-16 DIAGNOSIS — R5381 Other malaise: Secondary | ICD-10-CM | POA: Insufficient documentation

## 2015-02-16 DIAGNOSIS — R600 Localized edema: Secondary | ICD-10-CM | POA: Diagnosis not present

## 2015-02-16 DIAGNOSIS — Z86711 Personal history of pulmonary embolism: Secondary | ICD-10-CM | POA: Diagnosis not present

## 2015-02-16 DIAGNOSIS — J449 Chronic obstructive pulmonary disease, unspecified: Secondary | ICD-10-CM | POA: Insufficient documentation

## 2015-02-16 DIAGNOSIS — Z79899 Other long term (current) drug therapy: Secondary | ICD-10-CM | POA: Insufficient documentation

## 2015-02-16 DIAGNOSIS — Z87891 Personal history of nicotine dependence: Secondary | ICD-10-CM | POA: Insufficient documentation

## 2015-02-16 DIAGNOSIS — D6959 Other secondary thrombocytopenia: Secondary | ICD-10-CM | POA: Insufficient documentation

## 2015-02-16 DIAGNOSIS — R5383 Other fatigue: Secondary | ICD-10-CM | POA: Insufficient documentation

## 2015-02-16 DIAGNOSIS — D701 Agranulocytosis secondary to cancer chemotherapy: Secondary | ICD-10-CM | POA: Insufficient documentation

## 2015-02-16 DIAGNOSIS — R531 Weakness: Secondary | ICD-10-CM | POA: Insufficient documentation

## 2015-02-16 DIAGNOSIS — K219 Gastro-esophageal reflux disease without esophagitis: Secondary | ICD-10-CM | POA: Diagnosis not present

## 2015-02-16 LAB — CBC WITH DIFFERENTIAL/PLATELET
Basophils Absolute: 0 10*3/uL (ref 0–0.1)
Basophils Relative: 1 %
EOS ABS: 0 10*3/uL (ref 0–0.7)
EOS PCT: 1 %
HCT: 35.8 % (ref 35.0–47.0)
Hemoglobin: 12.5 g/dL (ref 12.0–16.0)
LYMPHS ABS: 1.1 10*3/uL (ref 1.0–3.6)
Lymphocytes Relative: 46 %
MCH: 36.8 pg — AB (ref 26.0–34.0)
MCHC: 35 g/dL (ref 32.0–36.0)
MCV: 104.9 fL — AB (ref 80.0–100.0)
MONO ABS: 0.3 10*3/uL (ref 0.2–0.9)
MONOS PCT: 11 %
Neutro Abs: 1 10*3/uL — ABNORMAL LOW (ref 1.4–6.5)
Neutrophils Relative %: 41 %
PLATELETS: 105 10*3/uL — AB (ref 150–440)
RBC: 3.41 MIL/uL — AB (ref 3.80–5.20)
RDW: 14.6 % — AB (ref 11.5–14.5)
WBC: 2.5 10*3/uL — AB (ref 3.6–11.0)

## 2015-02-16 LAB — COMPREHENSIVE METABOLIC PANEL
ALT: 16 U/L (ref 14–54)
ANION GAP: 6 (ref 5–15)
AST: 27 U/L (ref 15–41)
Albumin: 3.6 g/dL (ref 3.5–5.0)
Alkaline Phosphatase: 53 U/L (ref 38–126)
BUN: 19 mg/dL (ref 6–20)
CHLORIDE: 106 mmol/L (ref 101–111)
CO2: 26 mmol/L (ref 22–32)
Calcium: 9.3 mg/dL (ref 8.9–10.3)
Creatinine, Ser: 0.96 mg/dL (ref 0.44–1.00)
GFR, EST NON AFRICAN AMERICAN: 58 mL/min — AB (ref >60)
Glucose, Bld: 112 mg/dL — ABNORMAL HIGH (ref 65–99)
POTASSIUM: 4.1 mmol/L (ref 3.5–5.1)
SODIUM: 138 mmol/L (ref 135–145)
Total Bilirubin: 0.9 mg/dL (ref 0.3–1.2)
Total Protein: 6.4 g/dL — ABNORMAL LOW (ref 6.5–8.1)

## 2015-02-16 MED ORDER — POTASSIUM CHLORIDE CRYS ER 20 MEQ PO TBCR: 20.0000 meq | EXTENDED_RELEASE_TABLET | Freq: Two times a day (BID) | ORAL | Status: DC

## 2015-02-16 MED ORDER — ELIQUIS 5 MG PO TABS: 5.0000 mg | ORAL_TABLET | Freq: Two times a day (BID) | ORAL | Status: DC

## 2015-02-17 LAB — CA 125: CA 125: 39.8 U/mL — ABNORMAL HIGH (ref 0.0–38.1)

## 2015-03-01 ENCOUNTER — Other Ambulatory Visit: Payer: Self-pay | Admitting: Internal Medicine

## 2015-03-03 ENCOUNTER — Ambulatory Visit
Admission: RE | Admit: 2015-03-03 | Discharge: 2015-03-03 | Disposition: A | Discharge status: Home / Self Care | Payer: Medicare Other | Source: Ambulatory Visit | Attending: Oncology | Admitting: Oncology

## 2015-03-03 DIAGNOSIS — C569 Malignant neoplasm of unspecified ovary: Secondary | ICD-10-CM | POA: Diagnosis not present

## 2015-03-03 DIAGNOSIS — Z79899 Other long term (current) drug therapy: Secondary | ICD-10-CM | POA: Diagnosis not present

## 2015-03-03 DIAGNOSIS — R599 Enlarged lymph nodes, unspecified: Secondary | ICD-10-CM | POA: Insufficient documentation

## 2015-03-03 LAB — GLUCOSE, CAPILLARY: GLUCOSE-CAPILLARY: 96 mg/dL (ref 65–99)

## 2015-03-03 MED ORDER — FLUDEOXYGLUCOSE F - 18 (FDG) INJECTION
12.0000 | Freq: Once | INTRAVENOUS | Status: DC | PRN
  Administered 2015-03-03: 12.85 via INTRAVENOUS
  Filled 2015-03-03: qty 12

## 2015-03-08 ENCOUNTER — Encounter: Payer: Self-pay | Admitting: Hematology and Oncology

## 2015-03-09 ENCOUNTER — Inpatient Hospital Stay: Payer: Medicare Other

## 2015-03-09 ENCOUNTER — Inpatient Hospital Stay (HOSPITAL_BASED_OUTPATIENT_CLINIC_OR_DEPARTMENT_OTHER): Payer: Medicare Other | Admitting: Oncology

## 2015-03-09 VITALS — BP 108/71 | HR 82 | Temp 98.8°F | Resp 16 | Wt 122.6 lb

## 2015-03-09 DIAGNOSIS — R531 Weakness: Secondary | ICD-10-CM

## 2015-03-09 DIAGNOSIS — R5383 Other fatigue: Secondary | ICD-10-CM

## 2015-03-09 DIAGNOSIS — C569 Malignant neoplasm of unspecified ovary: Secondary | ICD-10-CM | POA: Diagnosis not present

## 2015-03-09 DIAGNOSIS — Z9221 Personal history of antineoplastic chemotherapy: Secondary | ICD-10-CM

## 2015-03-09 DIAGNOSIS — M810 Age-related osteoporosis without current pathological fracture: Secondary | ICD-10-CM

## 2015-03-09 DIAGNOSIS — J449 Chronic obstructive pulmonary disease, unspecified: Secondary | ICD-10-CM

## 2015-03-09 DIAGNOSIS — R5381 Other malaise: Secondary | ICD-10-CM

## 2015-03-09 DIAGNOSIS — Z7901 Long term (current) use of anticoagulants: Secondary | ICD-10-CM

## 2015-03-09 DIAGNOSIS — R600 Localized edema: Secondary | ICD-10-CM

## 2015-03-09 DIAGNOSIS — R701 Abnormal plasma viscosity: Secondary | ICD-10-CM

## 2015-03-09 DIAGNOSIS — Z86711 Personal history of pulmonary embolism: Secondary | ICD-10-CM

## 2015-03-09 DIAGNOSIS — D6959 Other secondary thrombocytopenia: Secondary | ICD-10-CM | POA: Diagnosis not present

## 2015-03-09 DIAGNOSIS — T451X5S Adverse effect of antineoplastic and immunosuppressive drugs, sequela: Secondary | ICD-10-CM

## 2015-03-09 LAB — CBC WITH DIFFERENTIAL/PLATELET
BASOS PCT: 1 %
Basophils Absolute: 0 10*3/uL (ref 0–0.1)
Eosinophils Absolute: 0.1 10*3/uL (ref 0–0.7)
Eosinophils Relative: 3 %
HEMATOCRIT: 36 % (ref 35.0–47.0)
HEMOGLOBIN: 12.5 g/dL (ref 12.0–16.0)
LYMPHS ABS: 0.9 10*3/uL — AB (ref 1.0–3.6)
Lymphocytes Relative: 37 %
MCH: 37 pg — AB (ref 26.0–34.0)
MCHC: 34.7 g/dL (ref 32.0–36.0)
MCV: 106.8 fL — AB (ref 80.0–100.0)
MONO ABS: 0.2 10*3/uL (ref 0.2–0.9)
MONOS PCT: 7 %
NEUTROS ABS: 1.3 10*3/uL — AB (ref 1.4–6.5)
NEUTROS PCT: 52 %
Platelets: 81 10*3/uL — ABNORMAL LOW (ref 150–440)
RBC: 3.37 MIL/uL — ABNORMAL LOW (ref 3.80–5.20)
RDW: 14.2 % (ref 11.5–14.5)
WBC: 2.4 10*3/uL — ABNORMAL LOW (ref 3.6–11.0)

## 2015-03-09 LAB — BASIC METABOLIC PANEL
ANION GAP: 6 (ref 5–15)
BUN: 24 mg/dL — ABNORMAL HIGH (ref 6–20)
CALCIUM: 9.1 mg/dL (ref 8.9–10.3)
CO2: 25 mmol/L (ref 22–32)
Chloride: 107 mmol/L (ref 101–111)
Creatinine, Ser: 0.93 mg/dL (ref 0.44–1.00)
GFR calc Af Amer: >60 mL/min (ref >60)
GFR, EST NON AFRICAN AMERICAN: 60 mL/min — AB (ref >60)
GLUCOSE: 110 mg/dL — AB (ref 65–99)
Potassium: 4.2 mmol/L (ref 3.5–5.1)
Sodium: 138 mmol/L (ref 135–145)

## 2015-03-15 ENCOUNTER — Other Ambulatory Visit: Payer: Self-pay | Admitting: Oncology

## 2015-03-16 ENCOUNTER — Inpatient Hospital Stay: Payer: Medicare Other

## 2015-03-16 ENCOUNTER — Inpatient Hospital Stay: Payer: Medicare Other | Attending: Oncology

## 2015-03-16 ENCOUNTER — Telehealth: Payer: Self-pay

## 2015-03-16 ENCOUNTER — Inpatient Hospital Stay (HOSPITAL_BASED_OUTPATIENT_CLINIC_OR_DEPARTMENT_OTHER): Payer: Medicare Other | Admitting: Oncology

## 2015-03-16 VITALS — BP 101/65 | HR 69 | Resp 18

## 2015-03-16 VITALS — BP 100/65 | HR 83 | Temp 96.1°F | Resp 16 | Wt 123.0 lb

## 2015-03-16 DIAGNOSIS — R197 Diarrhea, unspecified: Secondary | ICD-10-CM | POA: Diagnosis not present

## 2015-03-16 DIAGNOSIS — E860 Dehydration: Secondary | ICD-10-CM | POA: Diagnosis not present

## 2015-03-16 DIAGNOSIS — Z87891 Personal history of nicotine dependence: Secondary | ICD-10-CM | POA: Insufficient documentation

## 2015-03-16 DIAGNOSIS — R531 Weakness: Secondary | ICD-10-CM | POA: Diagnosis not present

## 2015-03-16 DIAGNOSIS — R002 Palpitations: Secondary | ICD-10-CM | POA: Diagnosis not present

## 2015-03-16 DIAGNOSIS — R5381 Other malaise: Secondary | ICD-10-CM | POA: Insufficient documentation

## 2015-03-16 DIAGNOSIS — R5383 Other fatigue: Secondary | ICD-10-CM

## 2015-03-16 DIAGNOSIS — J449 Chronic obstructive pulmonary disease, unspecified: Secondary | ICD-10-CM | POA: Insufficient documentation

## 2015-03-16 DIAGNOSIS — C7931 Secondary malignant neoplasm of brain: Secondary | ICD-10-CM | POA: Insufficient documentation

## 2015-03-16 DIAGNOSIS — Z7901 Long term (current) use of anticoagulants: Secondary | ICD-10-CM | POA: Insufficient documentation

## 2015-03-16 DIAGNOSIS — R600 Localized edema: Secondary | ICD-10-CM | POA: Diagnosis not present

## 2015-03-16 DIAGNOSIS — K219 Gastro-esophageal reflux disease without esophagitis: Secondary | ICD-10-CM

## 2015-03-16 DIAGNOSIS — R21 Rash and other nonspecific skin eruption: Secondary | ICD-10-CM | POA: Diagnosis not present

## 2015-03-16 DIAGNOSIS — C569 Malignant neoplasm of unspecified ovary: Secondary | ICD-10-CM

## 2015-03-16 DIAGNOSIS — Z79899 Other long term (current) drug therapy: Secondary | ICD-10-CM

## 2015-03-16 DIAGNOSIS — K589 Irritable bowel syndrome without diarrhea: Secondary | ICD-10-CM | POA: Diagnosis not present

## 2015-03-16 DIAGNOSIS — D61818 Other pancytopenia: Secondary | ICD-10-CM | POA: Diagnosis not present

## 2015-03-16 DIAGNOSIS — Z5111 Encounter for antineoplastic chemotherapy: Secondary | ICD-10-CM | POA: Insufficient documentation

## 2015-03-16 LAB — BASIC METABOLIC PANEL
Anion gap: 4 — ABNORMAL LOW (ref 5–15)
BUN: 21 mg/dL — ABNORMAL HIGH (ref 6–20)
CALCIUM: 8.5 mg/dL — AB (ref 8.9–10.3)
CO2: 23 mmol/L (ref 22–32)
CREATININE: 0.9 mg/dL (ref 0.44–1.00)
Chloride: 108 mmol/L (ref 101–111)
GFR calc non Af Amer: >60 mL/min (ref >60)
Glucose, Bld: 102 mg/dL — ABNORMAL HIGH (ref 65–99)
Potassium: 3.6 mmol/L (ref 3.5–5.1)
SODIUM: 135 mmol/L (ref 135–145)

## 2015-03-16 LAB — CBC WITH DIFFERENTIAL/PLATELET
BASOS PCT: 1 %
Basophils Absolute: 0 10*3/uL (ref 0–0.1)
EOS ABS: 0 10*3/uL (ref 0–0.7)
EOS PCT: 2 %
HEMATOCRIT: 34.5 % — AB (ref 35.0–47.0)
Hemoglobin: 12.2 g/dL (ref 12.0–16.0)
Lymphocytes Relative: 44 %
Lymphs Abs: 0.8 10*3/uL — ABNORMAL LOW (ref 1.0–3.6)
MCH: 37.3 pg — ABNORMAL HIGH (ref 26.0–34.0)
MCHC: 35.3 g/dL (ref 32.0–36.0)
MCV: 105.8 fL — ABNORMAL HIGH (ref 80.0–100.0)
MONO ABS: 0.2 10*3/uL (ref 0.2–0.9)
MONOS PCT: 11 %
Neutro Abs: 0.8 10*3/uL — ABNORMAL LOW (ref 1.4–6.5)
Neutrophils Relative %: 42 %
PLATELETS: 128 10*3/uL — AB (ref 150–440)
RBC: 3.27 MIL/uL — ABNORMAL LOW (ref 3.80–5.20)
RDW: 14.3 % (ref 11.5–14.5)
WBC: 1.8 10*3/uL — ABNORMAL LOW (ref 3.6–11.0)

## 2015-03-16 MED ORDER — DOCETAXEL CHEMO INJECTION 160 MG/16ML
25.0000 mg/m2 | Freq: Once | INTRAVENOUS | Status: AC
  Administered 2015-03-16: 40 mg via INTRAVENOUS
  Filled 2015-03-16: qty 4

## 2015-03-16 MED ORDER — SODIUM CHLORIDE 0.9 % IJ SOLN
10.0000 mL | INTRAMUSCULAR | Status: DC | PRN
  Administered 2015-03-16: 10 mL via INTRAVENOUS
  Filled 2015-03-16: qty 10

## 2015-03-16 MED ORDER — SODIUM CHLORIDE 0.9 % IV SOLN
Freq: Once | INTRAVENOUS | Status: AC
  Administered 2015-03-16: 13:00:00 via INTRAVENOUS
  Filled 2015-03-16: qty 4

## 2015-03-16 MED ORDER — SODIUM CHLORIDE 0.9 % IV SOLN
Freq: Once | INTRAVENOUS | Status: AC
  Administered 2015-03-16: 12:00:00 via INTRAVENOUS
  Filled 2015-03-16: qty 1000

## 2015-03-16 MED ORDER — HEPARIN SOD (PORK) LOCK FLUSH 100 UNIT/ML IV SOLN
500.0000 [IU] | Freq: Once | INTRAVENOUS | Status: AC
  Administered 2015-03-16: 500 [IU] via INTRAVENOUS
  Filled 2015-03-16: qty 5

## 2015-03-16 MED ORDER — TRAMADOL HCL 50 MG PO TABS: 50.0000 mg | ORAL_TABLET | Freq: Four times a day (QID) | ORAL | Status: DC | PRN

## 2015-03-16 MED ORDER — DIPHENHYDRAMINE HCL 50 MG/ML IJ SOLN
12.5000 mg | Freq: Once | INTRAMUSCULAR | Status: AC
  Administered 2015-03-16: 12.5 mg via INTRAVENOUS
  Filled 2015-03-16: qty 1

## 2015-03-16 NOTE — Progress Notes (Signed)
ANC: 800. MD, Dr. Grayland Ormond, notified via telephone and already aware. Per MD, Dr. Grayland Ormond, order: proceed with chemotherapy treatment today.

## 2015-03-16 NOTE — Progress Notes (Signed)
Patient has a new right groin pain that radiates to her hip and the only pain medication she can tolerate is Morphine but does not like to take it much.

## 2015-03-16 NOTE — Telephone Encounter (Signed)
Neutrophils =0.8.  MD to proceed with treatment today

## 2015-03-17 LAB — CA 125: CA 125: 58.4 U/mL — ABNORMAL HIGH (ref 0.0–38.1)

## 2015-03-20 NOTE — Progress Notes (Signed)
[facility]  [phone] [phone]) [phone]  ID: GRACIELLA ARMENT OB: [birth year]  MR#: [id number]  KGU#:[id number]  Patient Care Team: Jackolyn Confer, MD as PCP - General (Internal Medicine)  CHIEF COMPLAINT:  Chief Complaint  Patient presents with  . Ovarian Cancer  . Chemotherapy    INTERVAL HISTORY: Patient returns to clinic today for further evaluation and initiation of cycle 1, day 1 of single agent Taxotere.  She currently feels well.  She does not complain of peripheral neuropathy today. Her bilateral lower extremity edema is unchanged. She denies any fevers. She denies any chest pain or shortness of breath. She has a fair appetite and denies weight loss. She denies any nausea, vomiting, constipation, or diarrhea. Patient offers no further specific complaints.   REVIEW OF SYSTEMS:   Review of Systems  Constitutional: Positive for malaise/fatigue. Negative for weight loss.  Cardiovascular: Positive for leg swelling.  Genitourinary: Negative.   Musculoskeletal: Negative.   Neurological: Positive for weakness.    As per HPI. Otherwise, a complete review of systems is negatve.  PAST MEDICAL HISTORY: Past Medical History  Diagnosis Date  . 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 Irritable bowel   . C. difficile colitis 2011  . Allergic rhinitis     Followed by Dr. Donneta Romberg  . Esophageal reflux     Followed by GI physician, on Dexilant and Ranitidine  . Migraine   . 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 Osteoporosis 2009    on Reclast once yearly, Followed by Dr. Jefm Bryant  . Ovarian cancer   . COPD (chronic obstructive pulmonary disease)   . Thrombocytopenia     PAST SURGICAL HISTORY: Past Surgical History  Procedure Laterality Date  . Bil roken wrists    . Gallbladder removed    . Bilateral bunionectomies    . Cesarean section    . Abdominal hysterectomy    . Cholecystectomy      FAMILY HISTORY:  Unchanged. No reported history of malignancy or chronic disease.     ADVANCED DIRECTIVES:    HEALTH MAINTENANCE: Social History  Substance Use Topics  . Smoking status: Former Smoker    Quit date: 06/12/1989  . Smokeless tobacco: Never Used  . Alcohol Use: No     Colonoscopy:  PAP:  Bone density:  Lipid panel:  Allergies  Allergen Reactions  . Aspirin     Other reaction(s): Localized superficial swelling of skin uncoated asa-causes throat to swell up  . Codeine Itching    Other reaction(s): Itching of Skin  . Fosamax  [Alendronate Sodium]     Other reaction(s): Distress (finding)  . Ibuprofen   . Ibuprofen     Other reaction(s): Distress (finding)  . Metronidazole Nausea And Vomiting  . Naproxen     Other reaction(s): Distress (finding)  . Naproxen Sodium   . Nsaids     GI UPSET / GERD  . Oxycodone     Other reaction(s): Diarrhea and vomiting (finding)  . Oxycodone-Acetaminophen     Other reaction(s): Diarrhea and vomiting (finding)    Current Outpatient Prescriptions  Medication Sig Dispense Refill  . Acetaminophen 500 MG coapsule Take 1,000 mg by mouth every 6 (six) hours as needed for fever.    . Calcium Carb-Cholecalciferol (CALCIUM 1000 + D PO) Take by mouth daily.      . DimenhyDRINATE (DRAMAMINE PO) Take by mouth.    Arne Cleveland 5 MG TABS tablet take 1  tablet by mouth twice a day 60 tablet 0  . furosemide (LASIX) 20 MG tablet take 1 tablet by mouth once daily 30 tablet 5  . Multiple Vitamin (MULTIVITAMIN) tablet Take 1 tablet by mouth daily.      . phenazopyridine (PYRIDIUM) 100 MG tablet Take 1 tablet (100 mg total) by mouth 3 (three) times daily as needed for pain. 21 tablet 0  . potassium chloride SA (K-DUR,KLOR-CON) 20 MEQ tablet Take 1 tablet (20 mEq total) by mouth 2 (two) times daily. 60 tablet 1  . Probiotic Product (PRO-BIOTIC BLEND) CAPS Take 1 capsule by mouth daily.    . prochlorperazine (COMPAZINE) 10 MG tablet Take 10 mg by mouth 3 (three)  times daily.  0  . topiramate (TOPAMAX) 25 MG tablet TAKE 1-2 TABLETS BY MOUTH DAILY 60 tablet 5  . traMADol (ULTRAM) 50 MG tablet Take 1 tablet (50 mg total) by mouth every 6 (six) hours as needed. 60 tablet 1  . [DISCONTINUED] dexlansoprazole (DEXILANT) 60 MG capsule Take 60 mg by mouth daily.       No current facility-administered medications for this visit.    OBJECTIVE: Filed Vitals:   03/16/15 1135  BP: 100/65  Pulse: 83  Temp: 96.1 F (35.6 C)  Resp: 16     Body mass index is 20.01 kg/(m^2).    ECOG FS:1 - Symptomatic but completely ambulatory  General: Well-developed, well-nourished, no acute distress.  Eyes: anicteric sclera. Lungs: Clear to auscultation bilaterally. Heart: Regular rate and rhythm. No rubs, murmurs, or gallops. Abdomen: Soft, nontender, nondistended. No organomegaly noted, normoactive bowel sounds. Musculoskeletal: 1-2+ bilateral lower extremity edema. Neuro: Alert, answering all questions appropriately. Cranial nerves grossly intact. Skin: No rashes or petechiae noted. Psych: Normal affect.   LAB RESULTS:  Lab Results  Component Value Date   NA 135 03/16/2015   K 3.6 03/16/2015   CL 108 03/16/2015   CO2 23 03/16/2015   GLUCOSE 102* 03/16/2015   BUN 21* 03/16/2015   CREATININE 0.90 03/16/2015   CALCIUM 8.5* 03/16/2015   PROT 6.4* 02/16/2015   ALBUMIN 3.6 02/16/2015   AST 27 02/16/2015   ALT 16 02/16/2015   ALKPHOS 53 02/16/2015   BILITOT 0.9 02/16/2015   GFRNONAA >60 03/16/2015   GFRAA >60 03/16/2015    Lab Results  Component Value Date   WBC 1.8* 03/16/2015   NEUTROABS 0.8* 03/16/2015   HGB 12.2 03/16/2015   HCT 34.5* 03/16/2015   MCV 105.8* 03/16/2015   PLT 128* 03/16/2015     STUDIES: Nm Pet Image Restag (ps) Skull Base To Thigh  03/03/2015   CLINICAL DATA:  Subsequent treatment strategy for metastatic ovarian carcinoma. Status post chemotherapy. Restaging.  EXAM: NUCLEAR MEDICINE PET SKULL BASE TO THIGH  TECHNIQUE: 12.9 mCi  F-18 FDG was injected intravenously. Full-ring PET imaging was performed from the skull base to thigh after the radiotracer. CT data was obtained and used for attenuation correction and anatomic localization.  FASTING BLOOD GLUCOSE:  Value: 96 mg/dl  COMPARISON:  11/19/2014  FINDINGS: NECK  Mild hypermetabolic left supraclavicular lymphadenopathy has increased since previous study. Largest lymph node measures 1.8 cm on image 55/series 3, with SUV max of 5.1 on today's study compared to 2.6 previously.  CHEST  No hypermetabolic mediastinal or hilar nodes. No suspicious pulmonary nodules on the CT scan. There is persistent right middle lobe atelectasis or infiltrate which shows mild hypermetabolic activity which is not significantly changed compared to previous study.  ABDOMEN/PELVIS  No abnormal hypermetabolic  activity within the liver, pancreas, adrenal glands, or spleen.  New mild hypermetabolic adenopathy is seen in the bilateral retrocrural spaces, abdominal retroperitoneum, bilateral common and external iliac chains, and right inguinal region. Index abdominal lymph node in the left paraaortic region measures 1.7 cm on image 152/series 3 and has SUV max of 7.2. Index pelvic lymph node in the right external iliac chain measures 1.4 cm on image 2 under 9 of series 3, with SUV max of 5.8.  SKELETON  No focal hypermetabolic activity to suggest skeletal metastasis.  IMPRESSION: Increased and new hypermetabolic lymphadenopathy in left supraclavicular region, bilateral retrocrural spaces, abdominal retroperitoneum, bilateral iliac chains and right inguinal region, consistent progression of metastatic disease.  No significant change in right middle lobe atelectasis or infiltrate with associated metabolic activity. Continued followup by CT recommended.   Electronically Signed   By: Earle Gell M.D.   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  PLAN:     1. Ovarian cancer: PET scan results from February 19, 2015 reviewed independently and report progression of disease. CA-125 has trended up as well and is 58.4.  Proceed with cycle 1, day 1 of Taxotere 25 mg/m2.  She will receive this regimen on days 1, 8, 15 with day 22 off provided her pancytopenia remains adequate to treat. Return to clinic in 1 week for consideration of cycle 1, day 8. Will reimage in approximately Decemeber 2016.   2. Peripheral edema: Likely multifactorial. Continue diuretic as prescribed and recommendations per lymphedema clinic.  3. Weakness and fatigue/decreased performance status: Improved. Monitor. 4. Pancytopenia: Proceed cautiously with treatment as above.    Patient expressed understanding and was in agreement with this plan. She also understands that She can call clinic at any time with any questions, concerns, or complaints.    Lloyd Huger, MD   03/20/2015 2:04 PM

## 2015-03-20 NOTE — Progress Notes (Signed)
[facility]  [phone] [phone]) [phone]  ID: Amanda Robbins OB: Jan 11, 1941  MR#: [id number]  LTJ#:[id number]  Patient Care Team: Jackolyn Confer, MD as PCP - General (Internal Medicine)  CHIEF COMPLAINT:  Chief Complaint  Patient presents with  . Ovarian Cancer    INTERVAL HISTORY: Patient returns to clinic today for further evaluation and discussion of her imaging results.  Her weakness and fatigue have significantly improved. She does not complain of peripheral neuropathy today. Her bilateral lower extremity edema is unchanged. She denies any fevers. She denies any chest pain or shortness of breath. She has a fair appetite and denies weight loss. She denies any nausea, vomiting, constipation, or diarrhea. Patient offers no further specific complaints.   REVIEW OF SYSTEMS:   Review of Systems  Constitutional: Positive for malaise/fatigue. Negative for weight loss.  Cardiovascular: Positive for leg swelling.  Genitourinary: Negative.   Musculoskeletal: Negative.   Neurological: Positive for weakness.    As per HPI. Otherwise, a complete review of systems is negatve.  PAST MEDICAL HISTORY: Past Medical History  Diagnosis Date  . 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 Irritable bowel   . C. difficile colitis 2011  . Allergic rhinitis     Followed by Dr. Donneta Romberg  . Esophageal reflux     Followed by GI physician, on Dexilant and Ranitidine  . Migraine   . 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 Osteoporosis 2009    on Reclast once yearly, Followed by Dr. Jefm Bryant  . Ovarian cancer   . COPD (chronic obstructive pulmonary disease)   . Thrombocytopenia     PAST SURGICAL HISTORY: Past Surgical History  Procedure Laterality Date  . Bil roken wrists    . Gallbladder removed    . Bilateral bunionectomies    . Cesarean section    . Abdominal hysterectomy    . Cholecystectomy      FAMILY HISTORY: Unchanged.  No reported history of malignancy or chronic disease.     ADVANCED DIRECTIVES:    HEALTH MAINTENANCE: Social History  Substance Use Topics  . Smoking status: Former Smoker    Quit date: 06/12/1989  . Smokeless tobacco: Never Used  . Alcohol Use: No     Colonoscopy:  PAP:  Bone density:  Lipid panel:  Allergies  Allergen Reactions  . Aspirin     Other reaction(s): Localized superficial swelling of skin uncoated asa-causes throat to swell up  . Codeine Itching    Other reaction(s): Itching of Skin  . Fosamax  [Alendronate Sodium]     Other reaction(s): Distress (finding)  . Ibuprofen   . Ibuprofen     Other reaction(s): Distress (finding)  . Metronidazole Nausea And Vomiting  . Naproxen     Other reaction(s): Distress (finding)  . Naproxen Sodium   . Nsaids     GI UPSET / GERD  . Oxycodone     Other reaction(s): Diarrhea and vomiting (finding)  . Oxycodone-Acetaminophen     Other reaction(s): Diarrhea and vomiting (finding)    Current Outpatient Prescriptions  Medication Sig Dispense Refill  . Acetaminophen 500 MG coapsule Take 1,000 mg by mouth every 6 (six) hours as needed for fever.    . Calcium Carb-Cholecalciferol (CALCIUM 1000 + D PO) Take by mouth daily.      . DimenhyDRINATE (DRAMAMINE PO) Take by mouth.    . furosemide (LASIX) 20 MG tablet take 1 tablet by mouth once daily 30  tablet 5  . Multiple Vitamin (MULTIVITAMIN) tablet Take 1 tablet by mouth daily.      . phenazopyridine (PYRIDIUM) 100 MG tablet Take 1 tablet (100 mg total) by mouth 3 (three) times daily as needed for pain. 21 tablet 0  . potassium chloride SA (K-DUR,KLOR-CON) 20 MEQ tablet Take 1 tablet (20 mEq total) by mouth 2 (two) times daily. 60 tablet 1  . Probiotic Product (PRO-BIOTIC BLEND) CAPS Take 1 capsule by mouth daily.    . prochlorperazine (COMPAZINE) 10 MG tablet Take 10 mg by mouth 3 (three) times daily.  0  . topiramate (TOPAMAX) 25 MG tablet TAKE 1-2 TABLETS BY MOUTH DAILY 60  tablet 5  . ELIQUIS 5 MG TABS tablet take 1 tablet by mouth twice a day 60 tablet 0  . traMADol (ULTRAM) 50 MG tablet Take 1 tablet (50 mg total) by mouth every 6 (six) hours as needed. 60 tablet 1  . [DISCONTINUED] dexlansoprazole (DEXILANT) 60 MG capsule Take 60 mg by mouth daily.       No current facility-administered medications for this visit.    OBJECTIVE: Filed Vitals:   03/09/15 1512  BP: 108/71  Pulse: 82  Temp: 98.8 F (37.1 C)  Resp: 16     Body mass index is 19.94 kg/(m^2).    ECOG FS:1 - Symptomatic but completely ambulatory  General: Well-developed, well-nourished, no acute distress.  Eyes: anicteric sclera. Lungs: Clear to auscultation bilaterally. Heart: Regular rate and rhythm. No rubs, murmurs, or gallops. Abdomen: Soft, nontender, nondistended. No organomegaly noted, normoactive bowel sounds. Musculoskeletal: 1-2+ bilateral lower extremity edema. Neuro: Alert, answering all questions appropriately. Cranial nerves grossly intact. Skin: No rashes or petechiae noted. Psych: Normal affect.   LAB RESULTS:  Lab Results  Component Value Date   NA 135 03/16/2015   K 3.6 03/16/2015   CL 108 03/16/2015   CO2 23 03/16/2015   GLUCOSE 102* 03/16/2015   BUN 21* 03/16/2015   CREATININE 0.90 03/16/2015   CALCIUM 8.5* 03/16/2015   PROT 6.4* 02/16/2015   ALBUMIN 3.6 02/16/2015   AST 27 02/16/2015   ALT 16 02/16/2015   ALKPHOS 53 02/16/2015   BILITOT 0.9 02/16/2015   GFRNONAA >60 03/16/2015   GFRAA >60 03/16/2015    Lab Results  Component Value Date   WBC 1.8* 03/16/2015   NEUTROABS 0.8* 03/16/2015   HGB 12.2 03/16/2015   HCT 34.5* 03/16/2015   MCV 105.8* 03/16/2015   PLT 128* 03/16/2015     STUDIES: Nm Pet Image Restag (ps) Skull Base To Thigh  03/03/2015   CLINICAL DATA:  Subsequent treatment strategy for metastatic ovarian carcinoma. Status post chemotherapy. Restaging.  EXAM: NUCLEAR MEDICINE PET SKULL BASE TO THIGH  TECHNIQUE: 12.9 mCi F-18 FDG was  injected intravenously. Full-ring PET imaging was performed from the skull base to thigh after the radiotracer. CT data was obtained and used for attenuation correction and anatomic localization.  FASTING BLOOD GLUCOSE:  Value: 96 mg/dl  COMPARISON:  11/19/2014  FINDINGS: NECK  Mild hypermetabolic left supraclavicular lymphadenopathy has increased since previous study. Largest lymph node measures 1.8 cm on image 55/series 3, with SUV max of 5.1 on today's study compared to 2.6 previously.  CHEST  No hypermetabolic mediastinal or hilar nodes. No suspicious pulmonary nodules on the CT scan. There is persistent right middle lobe atelectasis or infiltrate which shows mild hypermetabolic activity which is not significantly changed compared to previous study.  ABDOMEN/PELVIS  No abnormal hypermetabolic activity within the liver, pancreas, adrenal  glands, or spleen.  New mild hypermetabolic adenopathy is seen in the bilateral retrocrural spaces, abdominal retroperitoneum, bilateral common and external iliac chains, and right inguinal region. Index abdominal lymph node in the left paraaortic region measures 1.7 cm on image 152/series 3 and has SUV max of 7.2. Index pelvic lymph node in the right external iliac chain measures 1.4 cm on image 2 under 9 of series 3, with SUV max of 5.8.  SKELETON  No focal hypermetabolic activity to suggest skeletal metastasis.  IMPRESSION: Increased and new hypermetabolic lymphadenopathy in left supraclavicular region, bilateral retrocrural spaces, abdominal retroperitoneum, bilateral iliac chains and right inguinal region, consistent progression of metastatic disease.  No significant change in right middle lobe atelectasis or infiltrate with associated metabolic activity. Continued followup by CT recommended.   Electronically Signed   By: Earle Gell M.D.   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  PLAN:    1. Ovarian  cancer: PET scan results from February 19, 2015 reviewed independently and report progression of disease. CA-125 has trended up as well and is 58.4.  Patient wishes to pursue palliative chemotherapy and will return to clinic in one week to initiate single agent Taxotere.  Will reimage in approximately Decemeber 2016.   2. Peripheral edema: Likely multifactorial. Continue diuretic as prescribed and recommendations per lymphedema clinic.  3. Weakness and fatigue/decreased performance status: Improved. Monitor. 4. Thrombocytopenia: Secondary to chemotherapy.  5. Leukopenia: Secondary to chemotherapy. Monitor.   Approximately 30 minutes was spent in discussion and consultation.   Patient expressed understanding and was in agreement with this plan. She also understands that She can call clinic at any time with any questions, concerns, or complaints.    Lloyd Huger, MD   03/20/2015 1:50 PM

## 2015-03-23 ENCOUNTER — Ambulatory Visit: Payer: Medicare Other | Admitting: Oncology

## 2015-03-23 ENCOUNTER — Other Ambulatory Visit: Payer: Medicare Other

## 2015-03-23 ENCOUNTER — Ambulatory Visit: Payer: Medicare Other

## 2015-03-25 ENCOUNTER — Inpatient Hospital Stay: Payer: Medicare Other

## 2015-03-25 ENCOUNTER — Inpatient Hospital Stay (HOSPITAL_BASED_OUTPATIENT_CLINIC_OR_DEPARTMENT_OTHER): Payer: Medicare Other | Admitting: Oncology

## 2015-03-25 ENCOUNTER — Encounter: Payer: Self-pay | Admitting: Oncology

## 2015-03-25 ENCOUNTER — Telehealth: Payer: Self-pay

## 2015-03-25 VITALS — BP 105/71 | HR 85 | Temp 97.6°F | Resp 18 | Wt 123.0 lb

## 2015-03-25 DIAGNOSIS — C569 Malignant neoplasm of unspecified ovary: Secondary | ICD-10-CM

## 2015-03-25 DIAGNOSIS — Z7901 Long term (current) use of anticoagulants: Secondary | ICD-10-CM

## 2015-03-25 DIAGNOSIS — J449 Chronic obstructive pulmonary disease, unspecified: Secondary | ICD-10-CM

## 2015-03-25 DIAGNOSIS — C7931 Secondary malignant neoplasm of brain: Secondary | ICD-10-CM

## 2015-03-25 DIAGNOSIS — D61818 Other pancytopenia: Secondary | ICD-10-CM | POA: Diagnosis not present

## 2015-03-25 DIAGNOSIS — R600 Localized edema: Secondary | ICD-10-CM

## 2015-03-25 DIAGNOSIS — R5383 Other fatigue: Secondary | ICD-10-CM | POA: Diagnosis not present

## 2015-03-25 DIAGNOSIS — R5381 Other malaise: Secondary | ICD-10-CM

## 2015-03-25 DIAGNOSIS — R531 Weakness: Secondary | ICD-10-CM

## 2015-03-25 DIAGNOSIS — Z87891 Personal history of nicotine dependence: Secondary | ICD-10-CM

## 2015-03-25 DIAGNOSIS — K219 Gastro-esophageal reflux disease without esophagitis: Secondary | ICD-10-CM

## 2015-03-25 DIAGNOSIS — Z5111 Encounter for antineoplastic chemotherapy: Secondary | ICD-10-CM | POA: Diagnosis not present

## 2015-03-25 DIAGNOSIS — K589 Irritable bowel syndrome without diarrhea: Secondary | ICD-10-CM

## 2015-03-25 DIAGNOSIS — Z79899 Other long term (current) drug therapy: Secondary | ICD-10-CM

## 2015-03-25 LAB — COMPREHENSIVE METABOLIC PANEL
ALBUMIN: 3.1 g/dL — AB (ref 3.5–5.0)
ALT: 12 U/L — ABNORMAL LOW (ref 14–54)
ANION GAP: 4 — AB (ref 5–15)
AST: 27 U/L (ref 15–41)
Alkaline Phosphatase: 46 U/L (ref 38–126)
BUN: 22 mg/dL — ABNORMAL HIGH (ref 6–20)
CHLORIDE: 109 mmol/L (ref 101–111)
CO2: 23 mmol/L (ref 22–32)
CREATININE: 0.78 mg/dL (ref 0.44–1.00)
Calcium: 8.5 mg/dL — ABNORMAL LOW (ref 8.9–10.3)
Glucose, Bld: 128 mg/dL — ABNORMAL HIGH (ref 65–99)
Potassium: 3.7 mmol/L (ref 3.5–5.1)
SODIUM: 136 mmol/L (ref 135–145)
Total Bilirubin: 0.7 mg/dL (ref 0.3–1.2)
Total Protein: 5.5 g/dL — ABNORMAL LOW (ref 6.5–8.1)

## 2015-03-25 LAB — CBC WITH DIFFERENTIAL/PLATELET
BASOS ABS: 0 10*3/uL (ref 0–0.1)
BASOS PCT: 1 %
Eosinophils Absolute: 0 10*3/uL (ref 0–0.7)
Eosinophils Relative: 0 %
HEMATOCRIT: 31.8 % — AB (ref 35.0–47.0)
HEMOGLOBIN: 11 g/dL — AB (ref 12.0–16.0)
Lymphocytes Relative: 36 %
Lymphs Abs: 0.7 10*3/uL — ABNORMAL LOW (ref 1.0–3.6)
MCH: 36.4 pg — ABNORMAL HIGH (ref 26.0–34.0)
MCHC: 34.5 g/dL (ref 32.0–36.0)
MCV: 105.5 fL — ABNORMAL HIGH (ref 80.0–100.0)
Monocytes Absolute: 0.2 10*3/uL (ref 0.2–0.9)
Monocytes Relative: 12 %
NEUTROS ABS: 0.9 10*3/uL — AB (ref 1.4–6.5)
NEUTROS PCT: 51 %
Platelets: 110 10*3/uL — ABNORMAL LOW (ref 150–440)
RBC: 3.01 MIL/uL — AB (ref 3.80–5.20)
RDW: 13.5 % (ref 11.5–14.5)
WBC: 1.8 10*3/uL — AB (ref 3.6–11.0)

## 2015-03-25 MED ORDER — HEPARIN SOD (PORK) LOCK FLUSH 100 UNIT/ML IV SOLN
500.0000 [IU] | Freq: Once | INTRAVENOUS | Status: AC
  Administered 2015-03-25: 500 [IU] via INTRAVENOUS
  Filled 2015-03-25: qty 5

## 2015-03-25 MED ORDER — SODIUM CHLORIDE 0.9 % IJ SOLN
10.0000 mL | INTRAMUSCULAR | Status: DC | PRN
  Administered 2015-03-25: 10 mL via INTRAVENOUS
  Filled 2015-03-25: qty 10

## 2015-03-25 MED ORDER — FIRST-DUKES MOUTHWASH MT SUSP
5.0000 mL | Freq: Four times a day (QID) | OROMUCOSAL | Status: DC | PRN
Start: 2015-03-25 — End: 2015-08-19

## 2015-03-25 MED ORDER — SODIUM CHLORIDE 0.9 % IV SOLN
Freq: Once | INTRAVENOUS | Status: AC
  Administered 2015-03-25: 12:00:00 via INTRAVENOUS
  Filled 2015-03-25: qty 1000

## 2015-03-25 MED ORDER — SODIUM CHLORIDE 0.9 % IV SOLN
Freq: Once | INTRAVENOUS | Status: AC
  Administered 2015-03-25: 13:00:00 via INTRAVENOUS
  Filled 2015-03-25: qty 4

## 2015-03-25 MED ORDER — DOCETAXEL CHEMO INJECTION 160 MG/16ML
25.0000 mg/m2 | Freq: Once | INTRAVENOUS | Status: AC
  Administered 2015-03-25: 40 mg via INTRAVENOUS
  Filled 2015-03-25: qty 4

## 2015-03-25 MED ORDER — DIPHENHYDRAMINE HCL 50 MG/ML IJ SOLN
12.5000 mg | Freq: Once | INTRAMUSCULAR | Status: AC
  Administered 2015-03-25: 12.5 mg via INTRAVENOUS
  Filled 2015-03-25: qty 1

## 2015-03-25 NOTE — Telephone Encounter (Signed)
Neutrophils =0.9, MD ok with treatment today

## 2015-03-25 NOTE — Progress Notes (Signed)
Patient here today for follow up and possible treatment for Ovarian Cancer. States she has not felt as good this week. States pain in right leg is 6/10 mostly at night. She reports she took the Tramadol ordered for her but she experienced  Indigestion and soreness of mouth. "It might be my chemotherapy but I stopped taking the Tramadol and went back to Tylenol".

## 2015-04-01 ENCOUNTER — Inpatient Hospital Stay (HOSPITAL_BASED_OUTPATIENT_CLINIC_OR_DEPARTMENT_OTHER): Payer: Medicare Other | Admitting: Oncology

## 2015-04-01 ENCOUNTER — Inpatient Hospital Stay: Payer: Medicare Other

## 2015-04-01 VITALS — BP 99/65 | HR 80 | Temp 99.6°F | Resp 16 | Wt 124.6 lb

## 2015-04-01 DIAGNOSIS — R5381 Other malaise: Secondary | ICD-10-CM

## 2015-04-01 DIAGNOSIS — D61818 Other pancytopenia: Secondary | ICD-10-CM | POA: Diagnosis not present

## 2015-04-01 DIAGNOSIS — R002 Palpitations: Secondary | ICD-10-CM

## 2015-04-01 DIAGNOSIS — R5383 Other fatigue: Secondary | ICD-10-CM

## 2015-04-01 DIAGNOSIS — C569 Malignant neoplasm of unspecified ovary: Secondary | ICD-10-CM

## 2015-04-01 DIAGNOSIS — C7931 Secondary malignant neoplasm of brain: Secondary | ICD-10-CM

## 2015-04-01 DIAGNOSIS — E860 Dehydration: Secondary | ICD-10-CM | POA: Diagnosis not present

## 2015-04-01 DIAGNOSIS — R600 Localized edema: Secondary | ICD-10-CM

## 2015-04-01 DIAGNOSIS — Z5111 Encounter for antineoplastic chemotherapy: Secondary | ICD-10-CM | POA: Diagnosis not present

## 2015-04-01 DIAGNOSIS — Z87891 Personal history of nicotine dependence: Secondary | ICD-10-CM

## 2015-04-01 DIAGNOSIS — Z79899 Other long term (current) drug therapy: Secondary | ICD-10-CM

## 2015-04-01 DIAGNOSIS — R197 Diarrhea, unspecified: Secondary | ICD-10-CM

## 2015-04-01 DIAGNOSIS — R21 Rash and other nonspecific skin eruption: Secondary | ICD-10-CM

## 2015-04-01 LAB — COMPREHENSIVE METABOLIC PANEL
ALBUMIN: 3.3 g/dL — AB (ref 3.5–5.0)
ALK PHOS: 50 U/L (ref 38–126)
ALT: 16 U/L (ref 14–54)
ANION GAP: 5 (ref 5–15)
AST: 30 U/L (ref 15–41)
BUN: 22 mg/dL — ABNORMAL HIGH (ref 6–20)
CHLORIDE: 109 mmol/L (ref 101–111)
CO2: 23 mmol/L (ref 22–32)
Calcium: 8.3 mg/dL — ABNORMAL LOW (ref 8.9–10.3)
Creatinine, Ser: 0.81 mg/dL (ref 0.44–1.00)
GFR calc Af Amer: >60 mL/min (ref >60)
GFR calc non Af Amer: >60 mL/min (ref >60)
GLUCOSE: 109 mg/dL — AB (ref 65–99)
POTASSIUM: 3.6 mmol/L (ref 3.5–5.1)
SODIUM: 137 mmol/L (ref 135–145)
Total Bilirubin: 0.8 mg/dL (ref 0.3–1.2)
Total Protein: 5.7 g/dL — ABNORMAL LOW (ref 6.5–8.1)

## 2015-04-01 MED ORDER — SODIUM CHLORIDE 0.9 % IV SOLN
Freq: Once | INTRAVENOUS | Status: AC
  Administered 2015-04-01: 15:00:00 via INTRAVENOUS
  Filled 2015-04-01: qty 4

## 2015-04-01 MED ORDER — HEPARIN SOD (PORK) LOCK FLUSH 100 UNIT/ML IV SOLN
500.0000 [IU] | Freq: Once | INTRAVENOUS | Status: AC | PRN
  Administered 2015-04-01: 500 [IU]
  Filled 2015-04-01: qty 5

## 2015-04-01 MED ORDER — DIPHENHYDRAMINE HCL 50 MG/ML IJ SOLN
12.5000 mg | Freq: Once | INTRAMUSCULAR | Status: AC
  Administered 2015-04-01: 12.5 mg via INTRAVENOUS
  Filled 2015-04-01: qty 1

## 2015-04-01 MED ORDER — SODIUM CHLORIDE 0.9 % IV SOLN
Freq: Once | INTRAVENOUS | Status: AC
  Administered 2015-04-01: 15:00:00 via INTRAVENOUS
  Filled 2015-04-01: qty 1000

## 2015-04-01 MED ORDER — DOCETAXEL CHEMO INJECTION 160 MG/16ML
25.0000 mg/m2 | Freq: Once | INTRAVENOUS | Status: AC
  Administered 2015-04-01: 40 mg via INTRAVENOUS
  Filled 2015-04-01: qty 4

## 2015-04-01 NOTE — Progress Notes (Signed)
Patient has not been feeling well for the last few days.  She has a rash that is red, itchy, and burning for the past 3 days.  Also having diarrhea with incontinence, for example she had 1 episode of diarrhea this morning then took 1/2 an Immodium tablet, has not had any loose stools since.  Has the feeling of palpitations with any exertion and she will have to sit down.

## 2015-04-02 LAB — CBC WITH DIFFERENTIAL/PLATELET
BASOS ABS: 0 10*3/uL (ref 0–0.1)
Basophils Relative: 1 %
EOS ABS: 0 10*3/uL (ref 0–0.7)
Eosinophils Relative: 0 %
HCT: 30.5 % — ABNORMAL LOW (ref 35.0–47.0)
HEMOGLOBIN: 10.8 g/dL — AB (ref 12.0–16.0)
LYMPHS PCT: 32 %
Lymphs Abs: 0.8 10*3/uL — ABNORMAL LOW (ref 1.0–3.6)
MCH: 36.5 pg — AB (ref 26.0–34.0)
MCHC: 35.3 g/dL (ref 32.0–36.0)
MCV: 103.4 fL — ABNORMAL HIGH (ref 80.0–100.0)
Monocytes Absolute: 0.3 10*3/uL (ref 0.2–0.9)
Monocytes Relative: 11 %
NEUTROS PCT: 56 %
Neutro Abs: 1.5 10*3/uL (ref 1.4–6.5)
Platelets: 119 10*3/uL — ABNORMAL LOW (ref 150–440)
RBC: 2.95 MIL/uL — AB (ref 3.80–5.20)
RDW: 13 % (ref 11.5–14.5)
WBC: 2.6 10*3/uL — AB (ref 4.0–10.5)

## 2015-04-05 ENCOUNTER — Inpatient Hospital Stay: Payer: Medicare Other

## 2015-04-05 ENCOUNTER — Telehealth: Payer: Self-pay | Admitting: *Deleted

## 2015-04-05 DIAGNOSIS — Z5111 Encounter for antineoplastic chemotherapy: Secondary | ICD-10-CM | POA: Diagnosis not present

## 2015-04-05 DIAGNOSIS — R350 Frequency of micturition: Secondary | ICD-10-CM

## 2015-04-05 DIAGNOSIS — C569 Malignant neoplasm of unspecified ovary: Secondary | ICD-10-CM

## 2015-04-05 DIAGNOSIS — R309 Painful micturition, unspecified: Secondary | ICD-10-CM

## 2015-04-05 LAB — URINALYSIS COMPLETE WITH MICROSCOPIC (ARMC ONLY)
BILIRUBIN URINE: NEGATIVE
GLUCOSE, UA: NEGATIVE mg/dL
Ketones, ur: NEGATIVE mg/dL
Nitrite: NEGATIVE
Protein, ur: 30 mg/dL — AB
Specific Gravity, Urine: 1.01 (ref 1.005–1.030)
pH: 5 (ref 5.0–8.0)

## 2015-04-05 LAB — CBC WITH DIFFERENTIAL/PLATELET
BASOS ABS: 0 10*3/uL (ref 0–0.1)
BASOS PCT: 1 %
EOS ABS: 0 10*3/uL (ref 0–0.7)
EOS PCT: 0 %
HCT: 32.3 % — ABNORMAL LOW (ref 35.0–47.0)
Hemoglobin: 11.2 g/dL — ABNORMAL LOW (ref 12.0–16.0)
Lymphocytes Relative: 17 %
Lymphs Abs: 0.7 10*3/uL — ABNORMAL LOW (ref 1.0–3.6)
MCH: 36.1 pg — ABNORMAL HIGH (ref 26.0–34.0)
MCHC: 34.6 g/dL (ref 32.0–36.0)
MCV: 104.1 fL — AB (ref 80.0–100.0)
MONO ABS: 0.3 10*3/uL (ref 0.2–0.9)
Monocytes Relative: 8 %
Neutro Abs: 3.2 10*3/uL (ref 1.4–6.5)
Neutrophils Relative %: 74 %
PLATELETS: 119 10*3/uL — AB (ref 150–440)
RBC: 3.1 MIL/uL — AB (ref 3.80–5.20)
RDW: 13.3 % (ref 11.5–14.5)
WBC: 4.3 10*3/uL (ref 3.6–11.0)

## 2015-04-05 LAB — COMPREHENSIVE METABOLIC PANEL
ALBUMIN: 3.2 g/dL — AB (ref 3.5–5.0)
ALT: 16 U/L (ref 14–54)
AST: 30 U/L (ref 15–41)
Alkaline Phosphatase: 51 U/L (ref 38–126)
Anion gap: 3 — ABNORMAL LOW (ref 5–15)
BUN: 19 mg/dL (ref 6–20)
CHLORIDE: 108 mmol/L (ref 101–111)
CO2: 23 mmol/L (ref 22–32)
Calcium: 8.2 mg/dL — ABNORMAL LOW (ref 8.9–10.3)
Creatinine, Ser: 0.8 mg/dL (ref 0.44–1.00)
GFR calc Af Amer: >60 mL/min (ref >60)
Glucose, Bld: 104 mg/dL — ABNORMAL HIGH (ref 65–99)
POTASSIUM: 3.8 mmol/L (ref 3.5–5.1)
SODIUM: 134 mmol/L — AB (ref 135–145)
Total Bilirubin: 0.9 mg/dL (ref 0.3–1.2)
Total Protein: 5.7 g/dL — ABNORMAL LOW (ref 6.5–8.1)

## 2015-04-05 MED ORDER — SULFAMETHOXAZOLE-TRIMETHOPRIM 800-160 MG PO TABS: 1.0000 | ORAL_TABLET | Freq: Two times a day (BID) | ORAL | Status: DC

## 2015-04-05 MED ORDER — PHENAZOPYRIDINE HCL 200 MG PO TABS: 200.0000 mg | ORAL_TABLET | Freq: Three times a day (TID) | ORAL | Status: DC | PRN

## 2015-04-05 NOTE — Telephone Encounter (Signed)
Notified patietn to come in for urine sample, she said she will be here in 20 mins. Alos notified of 2 rx sent to pharmacy and she states she still has pyridium on hand but will get the abx

## 2015-04-05 NOTE — Telephone Encounter (Signed)
Called to report having to urinate all the time and it is hurting very bad every time she goes. She has been going every hour since 3 this morning. Denies fever.

## 2015-04-06 LAB — CA 125: CA 125: 63.5 U/mL — AB (ref 0.0–38.1)

## 2015-04-07 ENCOUNTER — Other Ambulatory Visit: Payer: Self-pay | Admitting: Oncology

## 2015-04-07 LAB — URINE CULTURE: Culture: >=100000

## 2015-04-08 NOTE — Progress Notes (Signed)
[place]  [phone]) [phone] Fax:(336) [phone]  ID: LASHAUNDA SCHILD OB: 09-30-1940  MR#: [id number]  AOZ#:[id number]  Patient Care Team: Jackolyn Confer, MD as PCP - General (Internal Medicine)  CHIEF COMPLAINT:  Chief Complaint  Patient presents with  . Ovarian Cancer    INTERVAL HISTORY: Patient returns to clinic today for further evaluation and initiation of cycle 1, day 15 of single agent Taxotere.  She has developed a rash that is itchy in the last several days. She also had an episode of diarrhea that resolved with Imodium. Finally, she has occasional palpitations with exertion. She does not complain of peripheral neuropathy today. Her bilateral lower extremity edema is unchanged. She denies any fevers. She denies any chest pain or shortness of breath. She has a fair appetite and denies weight loss. She denies any nausea, vomiting, constipation, or diarrhea. Patient offers no further specific complaints.   REVIEW OF SYSTEMS:   Review of Systems  Constitutional: Positive for malaise/fatigue. Negative for weight loss.  Cardiovascular: Positive for palpitations and leg swelling.  Gastrointestinal: Positive for diarrhea.  Genitourinary: Negative.   Musculoskeletal: Negative.   Neurological: Positive for weakness.    As per HPI. Otherwise, a complete review of systems is negatve.  PAST MEDICAL HISTORY: Past Medical History  Diagnosis Date  . Ischemic bowel syndrome ([place]) 06/2010  . Irritable bowel   . C. difficile colitis 2011  . Allergic rhinitis     Followed by Dr. Donneta Romberg  . Esophageal reflux     Followed by GI physician, on Dexilant and Ranitidine  . Migraine   . 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 Osteoporosis 2009    on Reclast once yearly, Followed by Dr. Jefm Bryant  . Ovarian cancer ([place])   . COPD (chronic obstructive pulmonary disease) ([place])   . Thrombocytopenia ([place])     PAST SURGICAL  HISTORY: Past Surgical History  Procedure Laterality Date  . Bil roken wrists    . Gallbladder removed    . Bilateral bunionectomies    . Cesarean section    . Abdominal hysterectomy    . Cholecystectomy      FAMILY HISTORY: Unchanged. No reported history of malignancy or chronic disease.     ADVANCED DIRECTIVES:    HEALTH MAINTENANCE: Social History  Substance Use Topics  . Smoking status: Former Smoker    Quit date: 06/12/1989  . Smokeless tobacco: Never Used  . Alcohol Use: No     Colonoscopy:  PAP:  Bone density:  Lipid panel:  Allergies  Allergen Reactions  . Aspirin     Other reaction(s): Localized superficial swelling of skin uncoated asa-causes throat to swell up  . Codeine Itching    Other reaction(s): Itching of Skin  . Fosamax  [Alendronate Sodium]     Other reaction(s): Distress (finding)  . Ibuprofen   . Ibuprofen     Other reaction(s): Distress (finding)  . Metronidazole Nausea And Vomiting  . Naproxen     Other reaction(s): Distress (finding)  . Naproxen Sodium   . Nsaids     GI UPSET / GERD  . Oxycodone     Other reaction(s): Diarrhea and vomiting (finding)  . Oxycodone-Acetaminophen     Other reaction(s): Diarrhea and vomiting (finding)    Current Outpatient Prescriptions  Medication Sig Dispense Refill  . Acetaminophen 500 MG coapsule Take 1,000 mg by mouth every 6 (six) hours as needed for fever.    Marland Kitchen  Calcium Carb-Cholecalciferol (CALCIUM 1000 + D PO) Take by mouth daily.      . DimenhyDRINATE (DRAMAMINE PO) Take by mouth.    . Diphenhyd-Hydrocort-Nystatin (FIRST-DUKES MOUTHWASH) SUSP Use as directed 5 mLs in the mouth or throat 4 (four) times daily as needed. 1 Bottle 1  . ELIQUIS 5 MG TABS tablet take 1 tablet by mouth twice a day 60 tablet 0  . furosemide (LASIX) 20 MG tablet take 1 tablet by mouth once daily 30 tablet 5  . Multiple Vitamin (MULTIVITAMIN) tablet Take 1 tablet by mouth daily.      Marland Kitchen omeprazole (PRILOSEC) 20 MG  capsule Take 20 mg by mouth 2 (two) times daily before a meal.    . Probiotic Product (PRO-BIOTIC BLEND) CAPS Take 1 capsule by mouth daily.    . prochlorperazine (COMPAZINE) 10 MG tablet Take 10 mg by mouth 3 (three) times daily.  0  . topiramate (TOPAMAX) 25 MG tablet TAKE 1-2 TABLETS BY MOUTH DAILY 60 tablet 5  . traMADol (ULTRAM) 50 MG tablet Take 1 tablet (50 mg total) by mouth every 6 (six) hours as needed. 60 tablet 1  . phenazopyridine (PYRIDIUM) 200 MG tablet Take 1 tablet (200 mg total) by mouth 3 (three) times daily as needed for pain. 10 tablet 0  . potassium chloride SA (K-DUR,KLOR-CON) 20 MEQ tablet take 1 tablet by mouth twice a day 60 tablet 1  . sulfamethoxazole-trimethoprim (BACTRIM DS,SEPTRA DS) 800-160 MG tablet Take 1 tablet by mouth 2 (two) times daily. 14 tablet 0  . [DISCONTINUED] dexlansoprazole (DEXILANT) 60 MG capsule Take 60 mg by mouth daily.       No current facility-administered medications for this visit.    OBJECTIVE: Filed Vitals:   04/01/15 1411  BP: 99/65  Pulse: 80  Temp: 99.6 F (37.6 C)  Resp: 16     Body mass index is 20.26 kg/(m^2).    ECOG FS:1 - Symptomatic but completely ambulatory  General: Well-developed, well-nourished, no acute distress.  Eyes: anicteric sclera. Lungs: Clear to auscultation bilaterally. Heart: Regular rate and rhythm. No rubs, murmurs, or gallops. Abdomen: Soft, nontender, nondistended. No organomegaly noted, normoactive bowel sounds. Musculoskeletal: 1-2+ bilateral lower extremity edema. Neuro: Alert, answering all questions appropriately. Cranial nerves grossly intact. Skin: No rashes or petechiae noted. Psych: Normal affect.   LAB RESULTS:  Lab Results  Component Value Date   NA 134* 04/05/2015   K 3.8 04/05/2015   CL 108 04/05/2015   CO2 23 04/05/2015   GLUCOSE 104* 04/05/2015   BUN 19 04/05/2015   CREATININE 0.80 04/05/2015   CALCIUM 8.2* 04/05/2015   PROT 5.7* 04/05/2015   ALBUMIN 3.2* 04/05/2015    AST 30 04/05/2015   ALT 16 04/05/2015   ALKPHOS 51 04/05/2015   BILITOT 0.9 04/05/2015   GFRNONAA >60 04/05/2015   GFRAA >60 04/05/2015    Lab Results  Component Value Date   WBC 4.3 04/05/2015   NEUTROABS 3.2 04/05/2015   HGB 11.2* 04/05/2015   HCT 32.3* 04/05/2015   MCV 104.1* 04/05/2015   PLT 119* 04/05/2015     STUDIES: No results found.  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  PLAN:    1. Ovarian cancer: PET scan results from February 19, 2015 reviewed independently and report progression of disease. CA-125 has trended up as well and is 63.5.  Proceed with cycle 1, day 15 of Taxotere 25 mg/m2.  She will receive this regimen on days 1, 8, 15  with day 22 off provided her pancytopenia remains adequate to treat. Return to clinic in 2 weeks for consideration of cycle 2, day 1. Will reimage in approximately Decemeber 2016.   2. Peripheral edema: Likely multifactorial. Continue diuretic as prescribed and recommendations per lymphedema clinic.  3. Weakness and fatigue/decreased performance status: Improved. Monitor. 4. Pancytopenia: Proceed cautiously with treatment as above.  5. Diarrhea: Continue Imodium as needed. 6. Rash: Recommended over-the-counter hydrocortisone cream and Benadryl as needed. 7. Palpitations: Likely secondary to mild dehydration. We will give patient an additional 1 L of fluids during treatment today.   Patient expressed understanding and was in agreement with this plan. She also understands that She can call clinic at any time with any questions, concerns, or complaints.    Lloyd Huger, MD   04/08/2015 11:27 AM

## 2015-04-08 NOTE — Progress Notes (Signed)
[place]  [phone] [phone]) [phone]  ID: Amanda Robbins OB: 1940/11/13  MR#: [id number]  QIW#:[id number]  Patient Care Team: Jackolyn Confer, MD as PCP - General (Internal Medicine)  CHIEF COMPLAINT:  Chief Complaint  Patient presents with  . Ovarian Cancer  . Chemotherapy    INTERVAL HISTORY: Patient returns to clinic today for further evaluation and initiation of cycle 1, day 8 of single agent Taxotere.she tolerated her first infusion well without significant side effects. She admits to some mild weakness and fatigue, but otherwise feels well.  She does not complain of peripheral neuropathy today. Her bilateral lower extremity edema is unchanged. She denies any fevers. She denies any chest pain or shortness of breath. She has a fair appetite and denies weight loss. She denies any nausea, vomiting, constipation, or diarrhea. Patient offers no further specific complaints.   REVIEW OF SYSTEMS:   Review of Systems  Constitutional: Positive for malaise/fatigue. Negative for weight loss.  Cardiovascular: Positive for leg swelling.  Genitourinary: Negative.   Musculoskeletal: Negative.   Neurological: Positive for weakness.    As per HPI. Otherwise, a complete review of systems is negatve.  PAST MEDICAL HISTORY: Past Medical History  Diagnosis Date  . Ischemic bowel syndrome ([place]) 06/2010  . Irritable bowel   . C. difficile colitis 2011  . Allergic rhinitis     Followed by Dr. Donneta Romberg  . Esophageal reflux     Followed by GI physician, on Dexilant and Ranitidine  . Migraine   . 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 Osteoporosis 2009    on Reclast once yearly, Followed by Dr. Jefm Bryant  . Ovarian cancer ([place])   . COPD (chronic obstructive pulmonary disease) ([place])   . Thrombocytopenia ([place])     PAST SURGICAL HISTORY: Past Surgical History  Procedure Laterality Date  . Bil roken wrists    . Gallbladder  removed    . Bilateral bunionectomies    . Cesarean section    . Abdominal hysterectomy    . Cholecystectomy      FAMILY HISTORY: Unchanged. No reported history of malignancy or chronic disease.     ADVANCED DIRECTIVES:    HEALTH MAINTENANCE: Social History  Substance Use Topics  . Smoking status: Former Smoker    Quit date: 06/12/1989  . Smokeless tobacco: Never Used  . Alcohol Use: No     Colonoscopy:  PAP:  Bone density:  Lipid panel:  Allergies  Allergen Reactions  . Aspirin     Other reaction(s): Localized superficial swelling of skin uncoated asa-causes throat to swell up  . Codeine Itching    Other reaction(s): Itching of Skin  . Fosamax  [Alendronate Sodium]     Other reaction(s): Distress (finding)  . Ibuprofen   . Ibuprofen     Other reaction(s): Distress (finding)  . Metronidazole Nausea And Vomiting  . Naproxen     Other reaction(s): Distress (finding)  . Naproxen Sodium   . Nsaids     GI UPSET / GERD  . Oxycodone     Other reaction(s): Diarrhea and vomiting (finding)  . Oxycodone-Acetaminophen     Other reaction(s): Diarrhea and vomiting (finding)    Current Outpatient Prescriptions  Medication Sig Dispense Refill  . Acetaminophen 500 MG coapsule Take 1,000 mg by mouth every 6 (six) hours as needed for fever.    . Calcium Carb-Cholecalciferol (CALCIUM 1000 + D PO) Take by mouth daily.      Marland Kitchen  DimenhyDRINATE (DRAMAMINE PO) Take by mouth.    Arne Cleveland 5 MG TABS tablet take 1 tablet by mouth twice a day 60 tablet 0  . furosemide (LASIX) 20 MG tablet take 1 tablet by mouth once daily 30 tablet 5  . Multiple Vitamin (MULTIVITAMIN) tablet Take 1 tablet by mouth daily.      Marland Kitchen omeprazole (PRILOSEC) 20 MG capsule Take 20 mg by mouth 2 (two) times daily before a meal.    . Probiotic Product (PRO-BIOTIC BLEND) CAPS Take 1 capsule by mouth daily.    . prochlorperazine (COMPAZINE) 10 MG tablet Take 10 mg by mouth 3 (three) times daily.  0  . topiramate  (TOPAMAX) 25 MG tablet TAKE 1-2 TABLETS BY MOUTH DAILY 60 tablet 5  . Diphenhyd-Hydrocort-Nystatin (FIRST-DUKES MOUTHWASH) SUSP Use as directed 5 mLs in the mouth or throat 4 (four) times daily as needed. 1 Bottle 1  . phenazopyridine (PYRIDIUM) 200 MG tablet Take 1 tablet (200 mg total) by mouth 3 (three) times daily as needed for pain. 10 tablet 0  . potassium chloride SA (K-DUR,KLOR-CON) 20 MEQ tablet take 1 tablet by mouth twice a day 60 tablet 1  . sulfamethoxazole-trimethoprim (BACTRIM DS,SEPTRA DS) 800-160 MG tablet Take 1 tablet by mouth 2 (two) times daily. 14 tablet 0  . traMADol (ULTRAM) 50 MG tablet Take 1 tablet (50 mg total) by mouth every 6 (six) hours as needed. 60 tablet 1  . [DISCONTINUED] dexlansoprazole (DEXILANT) 60 MG capsule Take 60 mg by mouth daily.       No current facility-administered medications for this visit.    OBJECTIVE: Filed Vitals:   03/25/15 1037  BP: 105/71  Pulse: 85  Temp: 97.6 F (36.4 C)  Resp: 18     Body mass index is 20.01 kg/(m^2).    ECOG FS:1 - Symptomatic but completely ambulatory  General: Well-developed, well-nourished, no acute distress.  Eyes: anicteric sclera. Lungs: Clear to auscultation bilaterally. Heart: Regular rate and rhythm. No rubs, murmurs, or gallops. Abdomen: Soft, nontender, nondistended. No organomegaly noted, normoactive bowel sounds. Musculoskeletal: 1-2+ bilateral lower extremity edema. Neuro: Alert, answering all questions appropriately. Cranial nerves grossly intact. Skin: No rashes or petechiae noted. Psych: Normal affect.   LAB RESULTS:  Lab Results  Component Value Date   NA 134* 04/05/2015   K 3.8 04/05/2015   CL 108 04/05/2015   CO2 23 04/05/2015   GLUCOSE 104* 04/05/2015   BUN 19 04/05/2015   CREATININE 0.80 04/05/2015   CALCIUM 8.2* 04/05/2015   PROT 5.7* 04/05/2015   ALBUMIN 3.2* 04/05/2015   AST 30 04/05/2015   ALT 16 04/05/2015   ALKPHOS 51 04/05/2015   BILITOT 0.9 04/05/2015    GFRNONAA >60 04/05/2015   GFRAA >60 04/05/2015    Lab Results  Component Value Date   WBC 4.3 04/05/2015   NEUTROABS 3.2 04/05/2015   HGB 11.2* 04/05/2015   HCT 32.3* 04/05/2015   MCV 104.1* 04/05/2015   PLT 119* 04/05/2015     STUDIES: No results found.  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  PLAN:    1. Ovarian cancer: PET scan results from February 19, 2015 reviewed independently and report progression of disease. CA-125 has trended up as well and is 63.5.  Proceed with cycle 1, day 8 of Taxotere 25 mg/m2.  She will receive this regimen on days 1, 8, 15 with day 22 off provided her pancytopenia remains adequate to treat. Return to clinic in 1 week  for consideration of cycle 1, day 15. Will reimage in approximately Decemeber 2016.   2. Peripheral edema: Likely multifactorial. Continue diuretic as prescribed and recommendations per lymphedema clinic.  3. Weakness and fatigue/decreased performance status: Improved. Monitor. 4. Pancytopenia: Proceed cautiously with treatment as above.    Patient expressed understanding and was in agreement with this plan. She also understands that She can call clinic at any time with any questions, concerns, or complaints.    Lloyd Huger, MD   04/08/2015 11:24 AM

## 2015-04-12 ENCOUNTER — Other Ambulatory Visit: Payer: Self-pay | Admitting: Oncology

## 2015-04-13 ENCOUNTER — Encounter: Payer: Self-pay | Admitting: Oncology

## 2015-04-13 ENCOUNTER — Inpatient Hospital Stay: Payer: Medicare Other | Attending: Oncology

## 2015-04-13 ENCOUNTER — Inpatient Hospital Stay: Payer: Medicare Other

## 2015-04-13 ENCOUNTER — Inpatient Hospital Stay (HOSPITAL_BASED_OUTPATIENT_CLINIC_OR_DEPARTMENT_OTHER): Payer: Medicare Other | Admitting: Oncology

## 2015-04-13 VITALS — BP 99/60 | HR 82 | Temp 98.4°F | Ht 65.75 in | Wt 129.0 lb

## 2015-04-13 DIAGNOSIS — R002 Palpitations: Secondary | ICD-10-CM | POA: Insufficient documentation

## 2015-04-13 DIAGNOSIS — R600 Localized edema: Secondary | ICD-10-CM | POA: Diagnosis not present

## 2015-04-13 DIAGNOSIS — Z7901 Long term (current) use of anticoagulants: Secondary | ICD-10-CM | POA: Diagnosis not present

## 2015-04-13 DIAGNOSIS — Z5111 Encounter for antineoplastic chemotherapy: Secondary | ICD-10-CM | POA: Diagnosis not present

## 2015-04-13 DIAGNOSIS — R197 Diarrhea, unspecified: Secondary | ICD-10-CM

## 2015-04-13 DIAGNOSIS — D61818 Other pancytopenia: Secondary | ICD-10-CM | POA: Diagnosis not present

## 2015-04-13 DIAGNOSIS — R21 Rash and other nonspecific skin eruption: Secondary | ICD-10-CM | POA: Insufficient documentation

## 2015-04-13 DIAGNOSIS — R531 Weakness: Secondary | ICD-10-CM | POA: Insufficient documentation

## 2015-04-13 DIAGNOSIS — C569 Malignant neoplasm of unspecified ovary: Secondary | ICD-10-CM

## 2015-04-13 DIAGNOSIS — R5383 Other fatigue: Secondary | ICD-10-CM | POA: Diagnosis not present

## 2015-04-13 DIAGNOSIS — K219 Gastro-esophageal reflux disease without esophagitis: Secondary | ICD-10-CM | POA: Diagnosis not present

## 2015-04-13 DIAGNOSIS — Z87891 Personal history of nicotine dependence: Secondary | ICD-10-CM | POA: Insufficient documentation

## 2015-04-13 DIAGNOSIS — J449 Chronic obstructive pulmonary disease, unspecified: Secondary | ICD-10-CM | POA: Diagnosis not present

## 2015-04-13 DIAGNOSIS — R42 Dizziness and giddiness: Secondary | ICD-10-CM | POA: Diagnosis not present

## 2015-04-13 DIAGNOSIS — G629 Polyneuropathy, unspecified: Secondary | ICD-10-CM | POA: Insufficient documentation

## 2015-04-13 DIAGNOSIS — C7931 Secondary malignant neoplasm of brain: Secondary | ICD-10-CM | POA: Insufficient documentation

## 2015-04-13 DIAGNOSIS — I890 Lymphedema, not elsewhere classified: Secondary | ICD-10-CM

## 2015-04-13 DIAGNOSIS — Z79899 Other long term (current) drug therapy: Secondary | ICD-10-CM | POA: Insufficient documentation

## 2015-04-13 DIAGNOSIS — R5381 Other malaise: Secondary | ICD-10-CM | POA: Diagnosis not present

## 2015-04-13 LAB — BASIC METABOLIC PANEL
Anion gap: 3 — ABNORMAL LOW (ref 5–15)
BUN: 19 mg/dL (ref 6–20)
CALCIUM: 8.1 mg/dL — AB (ref 8.9–10.3)
CHLORIDE: 110 mmol/L (ref 101–111)
CO2: 21 mmol/L — AB (ref 22–32)
CREATININE: 0.93 mg/dL (ref 0.44–1.00)
GFR calc non Af Amer: 60 mL/min — ABNORMAL LOW (ref >60)
Glucose, Bld: 108 mg/dL — ABNORMAL HIGH (ref 65–99)
Potassium: 3.7 mmol/L (ref 3.5–5.1)
Sodium: 134 mmol/L — ABNORMAL LOW (ref 135–145)

## 2015-04-13 LAB — CBC WITH DIFFERENTIAL/PLATELET
Basophils Absolute: 0 10*3/uL (ref 0–0.1)
Basophils Relative: 1 %
Eosinophils Absolute: 0 10*3/uL (ref 0–0.7)
Eosinophils Relative: 0 %
HCT: 31.1 % (ref 36.0–46.0)
Hemoglobin: 10.6 g/dL (ref 12.0–15.0)
Lymphocytes Relative: 26 %
Lymphs Abs: 0.7 10*3/uL — ABNORMAL LOW (ref 1.0–3.6)
MCH: 35.9 pg (ref 26.0–34.0)
MCHC: 34.2 g/dL (ref 30.0–36.0)
MCV: 105.1 fL (ref 78.0–100.0)
Monocytes Absolute: 0.4 10*3/uL (ref 0.2–0.9)
Monocytes Relative: 14 %
Neutro Abs: 1.5 10*3/uL (ref 1.4–6.5)
Neutrophils Relative %: 59 %
Platelets: 119 10*3/uL (ref 150–400)
RBC: 2.96 MIL/uL (ref 3.87–5.11)
RDW: 13.3 % (ref 11.5–15.5)
WBC: 2.6 10*3/uL — ABNORMAL LOW (ref 3.6–11.0)

## 2015-04-13 MED ORDER — SODIUM CHLORIDE 0.9 % IV SOLN
Freq: Once | INTRAVENOUS | Status: AC
  Administered 2015-04-13: 11:00:00 via INTRAVENOUS
  Filled 2015-04-13: qty 4

## 2015-04-13 MED ORDER — SODIUM CHLORIDE 0.9 % IJ SOLN
10.0000 mL | INTRAMUSCULAR | Status: DC | PRN
Start: 2015-04-13 — End: 2015-04-13
  Administered 2015-04-13: 10 mL via INTRAVENOUS
  Filled 2015-04-13: qty 10

## 2015-04-13 MED ORDER — SODIUM CHLORIDE 0.9 % IV SOLN
Freq: Once | INTRAVENOUS | Status: AC
  Administered 2015-04-13: 10:00:00 via INTRAVENOUS
  Filled 2015-04-13: qty 1000

## 2015-04-13 MED ORDER — DEXAMETHASONE 4 MG PO TABS: 4.0000 mg | ORAL_TABLET | Freq: Every day | ORAL | Status: AC

## 2015-04-13 MED ORDER — HEPARIN SOD (PORK) LOCK FLUSH 100 UNIT/ML IV SOLN
500.0000 [IU] | Freq: Once | INTRAVENOUS | Status: AC
  Administered 2015-04-13: 500 [IU] via INTRAVENOUS
  Filled 2015-04-13: qty 5

## 2015-04-13 MED ORDER — DOCETAXEL CHEMO INJECTION 160 MG/16ML
25.0000 mg/m2 | Freq: Once | INTRAVENOUS | Status: AC
  Administered 2015-04-13: 40 mg via INTRAVENOUS
  Filled 2015-04-13: qty 4

## 2015-04-13 MED ORDER — DIPHENHYDRAMINE HCL 50 MG/ML IJ SOLN
12.5000 mg | Freq: Once | INTRAMUSCULAR | Status: AC
  Administered 2015-04-13: 12.5 mg via INTRAVENOUS
  Filled 2015-04-13: qty 1

## 2015-04-13 NOTE — Progress Notes (Signed)
[place]  Telephone:(336) [phone] Fax:(336) [phone]  ID: Amanda Robbins OB: 12/07/1940  MR#: [id number]  CBJ#:[id number]  Patient Care Team: Jackolyn Confer, MD as PCP - General (Internal Medicine)  CHIEF COMPLAINT:  Chief Complaint  Patient presents with  . Ovarian Cancer  . Chemotherapy    INTERVAL HISTORY: Patient returns to clinic today for further evaluation and consideration of cycle 2, day 1 of single agent Taxotere. Her rash has persisted and slightly worse. Her weakness and fatigue are unchanged.  She has occasional dizziness. She does not complain of peripheral neuropathy today. Her bilateral lower extremity edema is unchanged. She denies any fevers. She denies any chest pain or shortness of breath. She has a fair appetite and denies weight loss. She denies any nausea, vomiting, constipation, or diarrhea. Patient offers no further specific complaints.   REVIEW OF SYSTEMS:   Review of Systems  Constitutional: Positive for malaise/fatigue. Negative for weight loss.  Cardiovascular: Positive for palpitations and leg swelling.  Gastrointestinal: Positive for diarrhea.  Genitourinary: Negative.   Musculoskeletal: Negative.   Neurological: Positive for weakness.    As per HPI. Otherwise, a complete review of systems is negatve.  PAST MEDICAL HISTORY: Past Medical History  Diagnosis Date  . Ischemic bowel syndrome ([place]) 06/2010  . Irritable bowel   . C. difficile colitis 2011  . Allergic rhinitis     Followed by Dr. Donneta Romberg  . Esophageal reflux     Followed by GI physician, on Dexilant and Ranitidine  . Migraine   . 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 Osteoporosis 2009    on Reclast once yearly, Followed by Dr. Jefm Bryant  . Ovarian cancer ([place])   . COPD (chronic obstructive pulmonary disease) ([place])   . Thrombocytopenia ([place])     PAST SURGICAL HISTORY: Past Surgical History  Procedure Laterality Date  .  Bil roken wrists    . Gallbladder removed    . Bilateral bunionectomies    . Cesarean section    . Abdominal hysterectomy    . Cholecystectomy      FAMILY HISTORY: Unchanged. No reported history of malignancy or chronic disease.     ADVANCED DIRECTIVES:    HEALTH MAINTENANCE: Social History  Substance Use Topics  . Smoking status: Former Smoker    Quit date: 06/12/1989  . Smokeless tobacco: Never Used  . Alcohol Use: No     Colonoscopy:  PAP:  Bone density:  Lipid panel:  Allergies  Allergen Reactions  . Aspirin     Other reaction(s): Localized superficial swelling of skin uncoated asa-causes throat to swell up  . Codeine Itching    Other reaction(s): Itching of Skin  . Fosamax  [Alendronate Sodium]     Other reaction(s): Distress (finding)  . Ibuprofen   . Ibuprofen     Other reaction(s): Distress (finding)  . Metronidazole Nausea And Vomiting  . Naproxen     Other reaction(s): Distress (finding)  . Naproxen Sodium   . Nsaids     GI UPSET / GERD  . Oxycodone     Other reaction(s): Diarrhea and vomiting (finding)  . Oxycodone-Acetaminophen     Other reaction(s): Diarrhea and vomiting (finding)    Current Outpatient Prescriptions  Medication Sig Dispense Refill  . Acetaminophen 500 MG coapsule Take 1,000 mg by mouth every 6 (six) hours as needed for fever.    . Calcium Carb-Cholecalciferol (CALCIUM 1000 + D PO) Take by mouth daily.      Marland Kitchen  DimenhyDRINATE (DRAMAMINE PO) Take by mouth.    . Diphenhyd-Hydrocort-Nystatin (FIRST-DUKES MOUTHWASH) SUSP Use as directed 5 mLs in the mouth or throat 4 (four) times daily as needed. 1 Bottle 1  . ELIQUIS 5 MG TABS tablet take 1 tablet by mouth twice a day 60 tablet 0  . furosemide (LASIX) 20 MG tablet take 1 tablet by mouth once daily 30 tablet 5  . Multiple Vitamin (MULTIVITAMIN) tablet Take 1 tablet by mouth daily.      Marland Kitchen omeprazole (PRILOSEC) 20 MG capsule Take 20 mg by mouth 2 (two) times daily before a meal.    .  phenazopyridine (PYRIDIUM) 200 MG tablet Take 1 tablet (200 mg total) by mouth 3 (three) times daily as needed for pain. 10 tablet 0  . potassium chloride SA (K-DUR,KLOR-CON) 20 MEQ tablet take 1 tablet by mouth twice a day 60 tablet 1  . Probiotic Product (PRO-BIOTIC BLEND) CAPS Take 1 capsule by mouth daily.    . prochlorperazine (COMPAZINE) 10 MG tablet Take 10 mg by mouth 3 (three) times daily.  0  . topiramate (TOPAMAX) 25 MG tablet TAKE 1-2 TABLETS BY MOUTH DAILY 60 tablet 5  . traMADol (ULTRAM) 50 MG tablet Take 1 tablet (50 mg total) by mouth every 6 (six) hours as needed. 60 tablet 1  . dexamethasone (DECADRON) 4 MG tablet Take 1 tablet (4 mg total) by mouth daily. 60 tablet 2  . sulfamethoxazole-trimethoprim (BACTRIM DS,SEPTRA DS) 800-160 MG tablet Take 1 tablet by mouth 2 (two) times daily. (Patient not taking: Reported on 04/13/2015) 14 tablet 0  . [DISCONTINUED] dexlansoprazole (DEXILANT) 60 MG capsule Take 60 mg by mouth daily.       No current facility-administered medications for this visit.    OBJECTIVE: Filed Vitals:   04/13/15 0916  BP: 99/60  Pulse: 82  Temp: 98.4 F (36.9 C)     Body mass index is 20.98 kg/(m^2).    ECOG FS:1 - Symptomatic but completely ambulatory  General: Well-developed, well-nourished, no acute distress.  Eyes: anicteric sclera. Lungs: Clear to auscultation bilaterally. Heart: Regular rate and rhythm. No rubs, murmurs, or gallops. Abdomen: Soft, nontender, nondistended. No organomegaly noted, normoactive bowel sounds. Musculoskeletal: 1-2+ bilateral lower extremity edema. Neuro: Alert, answering all questions appropriately. Cranial nerves grossly intact. Skin: No rashes or petechiae noted. Psych: Normal affect.   LAB RESULTS:  Lab Results  Component Value Date   NA 134* 04/13/2015   K 3.7 04/13/2015   CL 110 04/13/2015   CO2 21* 04/13/2015   GLUCOSE 108* 04/13/2015   BUN 19 04/13/2015   CREATININE 0.93 04/13/2015   CALCIUM 8.1*  04/13/2015   PROT 5.7* 04/05/2015   ALBUMIN 3.2* 04/05/2015   AST 30 04/05/2015   ALT 16 04/05/2015   ALKPHOS 51 04/05/2015   BILITOT 0.9 04/05/2015   GFRNONAA 60* 04/13/2015   GFRAA >60 04/13/2015    Lab Results  Component Value Date   WBC 2.6* 04/13/2015   NEUTROABS 1.5 04/13/2015   HGB 10.6 04/13/2015   HCT 31.1 04/13/2015   MCV 105.1 04/13/2015   PLT 119 04/13/2015     STUDIES: No results found.  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  PLAN:    1. Ovarian cancer: PET scan results from February 19, 2015 reviewed independently and report progression of disease. CA-125 has trended up as well and is 63.5.  Proceed with cycle 2, day 1 of Taxotere 25 mg/m2.  She will receive this regimen  on days 1, 8, 15 with day 22 off provided her pancytopenia remains adequate to treat. Return to clinic in 1 week for consideration of cycle 2, day 8. Will reimage in approximately Decemeber 2016.   2. Peripheral edema: Likely multifactorial. Continue diuretic as prescribed and recommendations per lymphedema clinic.  3. Weakness and fatigue/decreased performance status: Unchanged. Monitor. 4. Pancytopenia: Proceed cautiously with treatment as above.  5. Diarrhea: Continue Imodium as needed. 6. Rash: Continue over-the-counter hydrocortisone cream and Benadryl as needed. Patient was given a prescription for decadron today. 7. Palpitations: Likely secondary to mild dehydration. Will give patient an additional 1 L of fluids during treatment today.   Patient expressed understanding and was in agreement with this plan. She also understands that She can call clinic at any time with any questions, concerns, or complaints.    Lloyd Huger, MD   04/13/2015 9:20 PM

## 2015-04-13 NOTE — Progress Notes (Signed)
Patient states she is feeling better this week. States appetite is good, just food doesn't taste good. Reports swelling in legs and feet has increased. Reports rash on chest, back and arms is worse. States she uses Cortisone cream and Benadryl for the itching. Denies pain today but states she gets SOB on exertion.

## 2015-04-14 LAB — CA 125: CA 125: 69.1 U/mL — ABNORMAL HIGH (ref 0.0–38.1)

## 2015-04-20 ENCOUNTER — Inpatient Hospital Stay: Payer: Medicare Other

## 2015-04-20 ENCOUNTER — Inpatient Hospital Stay (HOSPITAL_BASED_OUTPATIENT_CLINIC_OR_DEPARTMENT_OTHER): Payer: Medicare Other | Admitting: Oncology

## 2015-04-20 VITALS — BP 104/67 | HR 79 | Temp 97.4°F | Resp 16 | Wt 127.0 lb

## 2015-04-20 DIAGNOSIS — R21 Rash and other nonspecific skin eruption: Secondary | ICD-10-CM | POA: Diagnosis not present

## 2015-04-20 DIAGNOSIS — C569 Malignant neoplasm of unspecified ovary: Secondary | ICD-10-CM

## 2015-04-20 DIAGNOSIS — J449 Chronic obstructive pulmonary disease, unspecified: Secondary | ICD-10-CM

## 2015-04-20 DIAGNOSIS — Z87891 Personal history of nicotine dependence: Secondary | ICD-10-CM

## 2015-04-20 DIAGNOSIS — R600 Localized edema: Secondary | ICD-10-CM

## 2015-04-20 DIAGNOSIS — R5383 Other fatigue: Secondary | ICD-10-CM

## 2015-04-20 DIAGNOSIS — C7931 Secondary malignant neoplasm of brain: Secondary | ICD-10-CM

## 2015-04-20 DIAGNOSIS — I890 Lymphedema, not elsewhere classified: Secondary | ICD-10-CM

## 2015-04-20 DIAGNOSIS — Z5111 Encounter for antineoplastic chemotherapy: Secondary | ICD-10-CM | POA: Diagnosis not present

## 2015-04-20 DIAGNOSIS — D61818 Other pancytopenia: Secondary | ICD-10-CM

## 2015-04-20 DIAGNOSIS — R531 Weakness: Secondary | ICD-10-CM

## 2015-04-20 DIAGNOSIS — R197 Diarrhea, unspecified: Secondary | ICD-10-CM

## 2015-04-20 DIAGNOSIS — G629 Polyneuropathy, unspecified: Secondary | ICD-10-CM

## 2015-04-20 LAB — CBC WITH DIFFERENTIAL/PLATELET
BASOS PCT: 0 %
Basophils Absolute: 0 10*3/uL (ref 0–0.1)
EOS ABS: 0 10*3/uL (ref 0–0.7)
Eosinophils Relative: 0 %
HEMATOCRIT: 32.6 % — AB (ref 35.0–47.0)
HEMOGLOBIN: 11.4 g/dL — AB (ref 12.0–16.0)
LYMPHS ABS: 0.5 10*3/uL — AB (ref 1.0–3.6)
Lymphocytes Relative: 12 %
MCH: 36.2 pg — AB (ref 26.0–34.0)
MCHC: 35 g/dL (ref 32.0–36.0)
MCV: 103.4 fL — ABNORMAL HIGH (ref 80.0–100.0)
MONO ABS: 0.2 10*3/uL (ref 0.2–0.9)
MONOS PCT: 6 %
NEUTROS ABS: 3.1 10*3/uL (ref 1.4–6.5)
Neutrophils Relative %: 82 %
Platelets: 131 10*3/uL — ABNORMAL LOW (ref 150–440)
RBC: 3.15 MIL/uL — ABNORMAL LOW (ref 3.80–5.20)
RDW: 13 % (ref 11.5–14.5)
WBC: 3.8 10*3/uL (ref 3.6–11.0)

## 2015-04-20 LAB — COMPREHENSIVE METABOLIC PANEL
ALK PHOS: 54 U/L (ref 38–126)
ALT: 25 U/L (ref 14–54)
ANION GAP: 6 (ref 5–15)
AST: 38 U/L (ref 15–41)
Albumin: 3.4 g/dL — ABNORMAL LOW (ref 3.5–5.0)
BILIRUBIN TOTAL: 0.8 mg/dL (ref 0.3–1.2)
BUN: 25 mg/dL — ABNORMAL HIGH (ref 6–20)
CALCIUM: 9 mg/dL (ref 8.9–10.3)
CO2: 22 mmol/L (ref 22–32)
Chloride: 106 mmol/L (ref 101–111)
Creatinine, Ser: 0.77 mg/dL (ref 0.44–1.00)
GFR calc non Af Amer: >60 mL/min (ref >60)
GLUCOSE: 105 mg/dL — AB (ref 65–99)
POTASSIUM: 3.7 mmol/L (ref 3.5–5.1)
Sodium: 134 mmol/L — ABNORMAL LOW (ref 135–145)
TOTAL PROTEIN: 6 g/dL — AB (ref 6.5–8.1)

## 2015-04-20 MED ORDER — DIPHENHYDRAMINE HCL 50 MG/ML IJ SOLN
12.5000 mg | Freq: Once | INTRAMUSCULAR | Status: AC
  Administered 2015-04-20: 12.5 mg via INTRAVENOUS
  Filled 2015-04-20: qty 1

## 2015-04-20 MED ORDER — SODIUM CHLORIDE 0.9 % IJ SOLN
10.0000 mL | INTRAMUSCULAR | Status: DC | PRN
  Administered 2015-04-20: 10 mL
  Filled 2015-04-20: qty 10

## 2015-04-20 MED ORDER — HEPARIN SOD (PORK) LOCK FLUSH 100 UNIT/ML IV SOLN
500.0000 [IU] | Freq: Once | INTRAVENOUS | Status: AC
  Administered 2015-04-20: 500 [IU] via INTRAVENOUS

## 2015-04-20 MED ORDER — DOCETAXEL CHEMO INJECTION 160 MG/16ML
25.0000 mg/m2 | Freq: Once | INTRAVENOUS | Status: AC
  Administered 2015-04-20: 40 mg via INTRAVENOUS
  Filled 2015-04-20: qty 4

## 2015-04-20 MED ORDER — ZOLPIDEM TARTRATE 5 MG PO TABS: 5.0000 mg | ORAL_TABLET | Freq: Every evening | ORAL | Status: DC | PRN

## 2015-04-20 MED ORDER — HEPARIN SOD (PORK) LOCK FLUSH 100 UNIT/ML IV SOLN
250.0000 [IU] | Freq: Once | INTRAVENOUS | Status: DC | PRN
  Filled 2015-04-20: qty 5

## 2015-04-20 MED ORDER — SODIUM CHLORIDE 0.9 % IV SOLN
Freq: Once | INTRAVENOUS | Status: AC
  Administered 2015-04-20: 11:00:00 via INTRAVENOUS
  Filled 2015-04-20: qty 1000

## 2015-04-20 MED ORDER — DEXTROSE 5 % IV SOLN: 25.0000 mg/m2 | Freq: Once | INTRAVENOUS | Status: DC

## 2015-04-20 MED ORDER — DEXAMETHASONE SODIUM PHOSPHATE 100 MG/10ML IJ SOLN
Freq: Once | INTRAMUSCULAR | Status: AC
  Administered 2015-04-20: 11:00:00 via INTRAVENOUS
  Filled 2015-04-20: qty 4

## 2015-04-20 NOTE — Progress Notes (Signed)
Patient's neuropathy is now in her toes as well as moving up her legs to the knees.  Also the edema is now noticeable in the inner thighs.

## 2015-04-27 ENCOUNTER — Inpatient Hospital Stay (HOSPITAL_BASED_OUTPATIENT_CLINIC_OR_DEPARTMENT_OTHER): Payer: Medicare Other | Admitting: Oncology

## 2015-04-27 ENCOUNTER — Encounter: Payer: Self-pay | Admitting: Oncology

## 2015-04-27 ENCOUNTER — Inpatient Hospital Stay: Payer: Medicare Other

## 2015-04-27 VITALS — BP 102/67 | HR 86 | Temp 97.5°F | Resp 16 | Wt 123.5 lb

## 2015-04-27 DIAGNOSIS — R531 Weakness: Secondary | ICD-10-CM

## 2015-04-27 DIAGNOSIS — I890 Lymphedema, not elsewhere classified: Secondary | ICD-10-CM

## 2015-04-27 DIAGNOSIS — C569 Malignant neoplasm of unspecified ovary: Secondary | ICD-10-CM

## 2015-04-27 DIAGNOSIS — R21 Rash and other nonspecific skin eruption: Secondary | ICD-10-CM | POA: Diagnosis not present

## 2015-04-27 DIAGNOSIS — Z79899 Other long term (current) drug therapy: Secondary | ICD-10-CM

## 2015-04-27 DIAGNOSIS — D61818 Other pancytopenia: Secondary | ICD-10-CM | POA: Diagnosis not present

## 2015-04-27 DIAGNOSIS — Z7901 Long term (current) use of anticoagulants: Secondary | ICD-10-CM

## 2015-04-27 DIAGNOSIS — R600 Localized edema: Secondary | ICD-10-CM

## 2015-04-27 DIAGNOSIS — G629 Polyneuropathy, unspecified: Secondary | ICD-10-CM

## 2015-04-27 DIAGNOSIS — C7931 Secondary malignant neoplasm of brain: Secondary | ICD-10-CM | POA: Diagnosis not present

## 2015-04-27 DIAGNOSIS — R5383 Other fatigue: Secondary | ICD-10-CM

## 2015-04-27 DIAGNOSIS — Z5111 Encounter for antineoplastic chemotherapy: Secondary | ICD-10-CM | POA: Diagnosis not present

## 2015-04-27 DIAGNOSIS — J449 Chronic obstructive pulmonary disease, unspecified: Secondary | ICD-10-CM

## 2015-04-27 LAB — COMPREHENSIVE METABOLIC PANEL
ALT: 26 U/L (ref 14–54)
ANION GAP: 6 (ref 5–15)
AST: 29 U/L (ref 15–41)
Albumin: 3.5 g/dL (ref 3.5–5.0)
Alkaline Phosphatase: 55 U/L (ref 38–126)
BILIRUBIN TOTAL: 1.1 mg/dL (ref 0.3–1.2)
BUN: 24 mg/dL — ABNORMAL HIGH (ref 6–20)
CO2: 21 mmol/L — ABNORMAL LOW (ref 22–32)
Calcium: 9 mg/dL (ref 8.9–10.3)
Chloride: 105 mmol/L (ref 101–111)
Creatinine, Ser: 0.77 mg/dL (ref 0.44–1.00)
GFR calc Af Amer: >60 mL/min (ref >60)
Glucose, Bld: 95 mg/dL (ref 65–99)
POTASSIUM: 3.7 mmol/L (ref 3.5–5.1)
Sodium: 132 mmol/L — ABNORMAL LOW (ref 135–145)
TOTAL PROTEIN: 6.2 g/dL — AB (ref 6.5–8.1)

## 2015-04-27 LAB — CBC WITH DIFFERENTIAL/PLATELET
BASOS ABS: 0 10*3/uL (ref 0–0.1)
BASOS PCT: 0 %
EOS PCT: 0 %
Eosinophils Absolute: 0 10*3/uL (ref 0–0.7)
HEMATOCRIT: 35 % (ref 35.0–47.0)
Hemoglobin: 12.1 g/dL (ref 12.0–16.0)
LYMPHS PCT: 14 %
Lymphs Abs: 0.6 10*3/uL — ABNORMAL LOW (ref 1.0–3.6)
MCH: 35.4 pg — ABNORMAL HIGH (ref 26.0–34.0)
MCHC: 34.5 g/dL (ref 32.0–36.0)
MCV: 102.5 fL — AB (ref 80.0–100.0)
MONO ABS: 0.3 10*3/uL (ref 0.2–0.9)
Monocytes Relative: 8 %
NEUTROS ABS: 3.6 10*3/uL (ref 1.4–6.5)
Neutrophils Relative %: 78 %
PLATELETS: 111 10*3/uL — AB (ref 150–440)
RBC: 3.42 MIL/uL — AB (ref 3.80–5.20)
RDW: 13.1 % (ref 11.5–14.5)
WBC: 4.6 10*3/uL (ref 3.6–11.0)

## 2015-04-27 MED ORDER — SODIUM CHLORIDE 0.9 % IV SOLN
Freq: Once | INTRAVENOUS | Status: AC
  Administered 2015-04-27: 12:00:00 via INTRAVENOUS
  Filled 2015-04-27: qty 1000

## 2015-04-27 MED ORDER — SODIUM CHLORIDE 0.9 % IJ SOLN
10.0000 mL | INTRAMUSCULAR | Status: DC | PRN
  Administered 2015-04-27: 10 mL
  Filled 2015-04-27: qty 10

## 2015-04-27 MED ORDER — DIPHENHYDRAMINE HCL 50 MG/ML IJ SOLN
12.5000 mg | Freq: Once | INTRAMUSCULAR | Status: AC
  Administered 2015-04-27: 12.5 mg via INTRAVENOUS
  Filled 2015-04-27: qty 1

## 2015-04-27 MED ORDER — DEXAMETHASONE SODIUM PHOSPHATE 100 MG/10ML IJ SOLN
Freq: Once | INTRAMUSCULAR | Status: AC
  Administered 2015-04-27: 11:00:00 via INTRAVENOUS
  Filled 2015-04-27: qty 4

## 2015-04-27 MED ORDER — DOCETAXEL CHEMO INJECTION 160 MG/16ML
25.0000 mg/m2 | Freq: Once | INTRAVENOUS | Status: AC
  Administered 2015-04-27: 40 mg via INTRAVENOUS
  Filled 2015-04-27: qty 4

## 2015-04-27 MED ORDER — DOCETAXEL CHEMO INJECTION 160 MG/16ML
25.0000 mg/m2 | Freq: Once | INTRAVENOUS | Status: DC
  Filled 2015-04-27: qty 4

## 2015-04-27 MED ORDER — HEPARIN SOD (PORK) LOCK FLUSH 100 UNIT/ML IV SOLN
500.0000 [IU] | Freq: Once | INTRAVENOUS | Status: AC | PRN
  Administered 2015-04-27: 500 [IU]
  Filled 2015-04-27: qty 5

## 2015-04-27 MED ORDER — SODIUM CHLORIDE 0.9 % IV SOLN
Freq: Once | INTRAVENOUS | Status: AC
  Administered 2015-04-27: 10:00:00 via INTRAVENOUS
  Filled 2015-04-27: qty 1000

## 2015-04-27 NOTE — Progress Notes (Signed)
Patient's appetite is doing well despite the change in taste.  The rash has improved but still having a few new spots.  Is taking Miralax and that is helping her bowels stay regular.  The only pain she offers today is related to the neuropathy in her feet.

## 2015-05-02 NOTE — Progress Notes (Signed)
[place]  Telephone:(336) [phone] Fax:(336) [phone]  ID: Amanda Robbins OB: 1941-04-10  MR#: [id number]  [id number]  Patient Care Team: Jackolyn Confer, Amanda Robbins as PCP - General (Internal Medicine)  CHIEF COMPLAINT:  Chief Complaint  Patient presents with  . Ovarian Cancer    INTERVAL HISTORY: Patient returns to clinic today for further evaluation and consideration of cycle 2, day 8 of single agent Taxotere. Her rash has improved. Her weakness and fatigue are unchanged. 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 Her bilateral lower extremity edema is unchanged. She denies any fevers. She denies any chest pain or shortness of breath. She has a fair appetite and denies weight loss. She denies any nausea, vomiting, constipation, or diarrhea. Patient offers no further specific complaints.   REVIEW OF SYSTEMS:   Review of Systems  Constitutional: Positive for malaise/fatigue. Negative for weight loss.  Cardiovascular: Positive for leg swelling.  Gastrointestinal: Negative.  Negative for diarrhea.  Genitourinary: Negative.   Musculoskeletal: Negative.   Neurological: Positive for sensory change and weakness.    As per HPI. Otherwise, a complete review of systems is negatve.  PAST MEDICAL HISTORY: Past Medical History  Diagnosis Date  . Ischemic bowel syndrome ([place]) 06/2010  . Irritable bowel   . C. difficile colitis 2011  . Allergic rhinitis     Followed by Dr. Donneta Romberg  . Esophageal reflux     Followed by GI physician, on Dexilant and Ranitidine  . Migraine   . 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 Osteoporosis 2009    on Reclast once yearly, Followed by Dr. Jefm Bryant  . Ovarian cancer ([place])   . COPD (chronic obstructive pulmonary disease) ([place])   . Thrombocytopenia ([place])   . MVP (mitral valve prolapse)   . SVT (supraventricular tachycardia) ([place])     PAST SURGICAL HISTORY: Past Surgical  History  Procedure Laterality Date  . Bil roken wrists    . Gallbladder removed    . Bilateral bunionectomies    . Cesarean section    . Abdominal hysterectomy    . Cholecystectomy      FAMILY HISTORY: Unchanged. No reported history of malignancy or chronic disease.     ADVANCED DIRECTIVES:    HEALTH MAINTENANCE: Social History  Substance Use Topics  . Smoking status: Former Smoker    Quit date: 06/12/1989  . Smokeless tobacco: Never Used  . Alcohol Use: No     Colonoscopy:  PAP:  Bone density:  Lipid panel:  Allergies  Allergen Reactions  . Aspirin     Other reaction(s): Localized superficial swelling of skin uncoated asa-causes throat to swell up  . Codeine Itching    Other reaction(s): Itching of Skin  . Fosamax  [Alendronate Sodium]     Other reaction(s): Distress (finding)  . Ibuprofen   . Ibuprofen     Other reaction(s): Distress (finding)  . Metronidazole Nausea And Vomiting  . Naproxen     Other reaction(s): Distress (finding)  . Naproxen Sodium   . Nsaids     GI UPSET / GERD  . Oxycodone     Other reaction(s): Diarrhea and vomiting (finding)  . Oxycodone-Acetaminophen     Other reaction(s): Diarrhea and vomiting (finding)    Current Outpatient Prescriptions  Medication Sig Dispense Refill  . Acetaminophen 500 MG coapsule Take 1,000 mg by mouth every 6 (six) hours as needed for fever.    . Calcium Carb-Cholecalciferol (CALCIUM  1000 + D PO) Take by mouth daily.      Marland Kitchen dexamethasone (DECADRON) 4 MG tablet Take 1 tablet (4 mg total) by mouth daily. 60 tablet 2  . DimenhyDRINATE (DRAMAMINE PO) Take by mouth.    . Diphenhyd-Hydrocort-Nystatin (FIRST-DUKES MOUTHWASH) SUSP Use as directed 5 mLs in the mouth or throat 4 (four) times daily as needed. 1 Bottle 1  . ELIQUIS 5 MG TABS tablet take 1 tablet by mouth twice a day 60 tablet 0  . furosemide (LASIX) 20 MG tablet take 1 tablet by mouth once daily 30 tablet 5  . Multiple Vitamin (MULTIVITAMIN) tablet  Take 1 tablet by mouth daily.      Marland Kitchen omeprazole (PRILOSEC) 20 MG capsule Take 20 mg by mouth 2 (two) times daily before a meal.    . phenazopyridine (PYRIDIUM) 200 MG tablet Take 1 tablet (200 mg total) by mouth 3 (three) times daily as needed for pain. 10 tablet 0  . potassium chloride SA (K-DUR,KLOR-CON) 20 MEQ tablet take 1 tablet by mouth twice a day 60 tablet 1  . Probiotic Product (PRO-BIOTIC BLEND) CAPS Take 1 capsule by mouth daily.    . prochlorperazine (COMPAZINE) 10 MG tablet Take 10 mg by mouth 3 (three) times daily.  0  . topiramate (TOPAMAX) 25 MG tablet TAKE 1-2 TABLETS BY MOUTH DAILY 60 tablet 5  . traMADol (ULTRAM) 50 MG tablet Take 1 tablet (50 mg total) by mouth every 6 (six) hours as needed. 60 tablet 1  . zolpidem (AMBIEN) 5 MG tablet Take 1 tablet (5 mg total) by mouth at bedtime as needed for sleep. 1/2-1 tab 30 tablet 1  . [DISCONTINUED] dexlansoprazole (DEXILANT) 60 MG capsule Take 60 mg by mouth daily.       No current facility-administered medications for this visit.    OBJECTIVE: Filed Vitals:   04/20/15 0950  BP: 104/67  Pulse: 79  Temp: 97.4 F (36.3 C)  Resp: 16     Body mass index is 20.65 kg/(m^2).    ECOG FS:1 - Symptomatic but completely ambulatory  General: Well-developed, well-nourished, no acute distress.  Eyes: anicteric sclera. Lungs: Clear to auscultation bilaterally. Heart: Regular rate and rhythm. No rubs, murmurs, or gallops. Abdomen: Soft, nontender, nondistended. No organomegaly noted, normoactive bowel sounds. Musculoskeletal: 1-2+ bilateral lower extremity edema. Neuro: Alert, answering all questions appropriately. Cranial nerves grossly intact. Skin: No rashes or petechiae noted. Psych: Normal affect.   LAB RESULTS:  Lab Results  Component Value Date   NA 132* 04/27/2015   K 3.7 04/27/2015   CL 105 04/27/2015   CO2 21* 04/27/2015   GLUCOSE 95 04/27/2015   BUN 24* 04/27/2015   CREATININE 0.77 04/27/2015   CALCIUM 9.0  04/27/2015   PROT 6.2* 04/27/2015   ALBUMIN 3.5 04/27/2015   AST 29 04/27/2015   ALT 26 04/27/2015   ALKPHOS 55 04/27/2015   BILITOT 1.1 04/27/2015   GFRNONAA >60 04/27/2015   GFRAA >60 04/27/2015    Lab Results  Component Value Date   WBC 4.6 04/27/2015   NEUTROABS 3.6 04/27/2015   HGB 12.1 04/27/2015   HCT 35.0 04/27/2015   MCV 102.5* 04/27/2015   PLT 111* 04/27/2015     STUDIES: No results found.  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  PLAN:    1. Ovarian cancer: PET scan results from February 19, 2015 reviewed independently and report progression of disease. CA-125 is slowly trending up and is now 69.1  Proceed with cycle 2, day 8 of Taxotere 25 mg/m2.  She will receive this regimen on days 1, 8, 15 with day 22 off provided her pancytopenia remains adequate to treat. Return to clinic in 1 week for consideration of cycle 2, day 15. Will reimage in approximately Decemeber 2016.   2. Peripheral edema: Likely multifactorial. Continue diuretic as prescribed and recommendations per lymphedema clinic.  3. Weakness and fatigue/decreased performance status: Unchanged, monitor. 4. Pancytopenia: Proceed cautiously with treatment as above.  5. Diarrhea: Continue Imodium as needed. 6. Rash: Continue over-the-counter hydrocortisone cream and Benadryl as needed. Patient was given a prescription for decadron today. 7. Weakness and fatigue: Multifactorial we will give an additional 1L of IVL during treatment today.   Patient expressed understanding and was in agreement with this plan. She also understands that She can call clinic at any time with any questions, concerns, or complaints.    Amanda Huger, Amanda Robbins   05/02/2015 7:36 AM

## 2015-05-02 NOTE — Progress Notes (Signed)
[place]  Telephone:(336) [phone] Fax:(336) [phone]  ID: Amanda Robbins OB: 29-Mar-1941  MR#: [id number]  [id number]  Patient Care Team: Jackolyn Confer, MD as PCP - General (Internal Medicine)  CHIEF COMPLAINT:  Chief Complaint  Patient presents with  . Ovarian Cancer    INTERVAL HISTORY: Patient returns to clinic today for further evaluation and consideration of cycle 2, day 15 of single agent Taxotere. Her rash has improved. Her weakness and fatigue are unchanged. 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 She denies any fevers. She denies any chest pain or shortness of breath. She has a fair appetite and denies weight loss. She denies any nausea, vomiting, constipation, or diarrhea. Patient offers no further specific complaints.   REVIEW OF SYSTEMS:   Review of Systems  Constitutional: Positive for malaise/fatigue. Negative for weight loss.  Cardiovascular: Positive for leg swelling.  Gastrointestinal: Negative.  Negative for diarrhea.  Genitourinary: Negative.   Musculoskeletal: Negative.   Neurological: Positive for sensory change and weakness.    As per HPI. Otherwise, a complete review of systems is negatve.  PAST MEDICAL HISTORY: Past Medical History  Diagnosis Date  . Ischemic bowel syndrome ([place]) 06/2010  . Irritable bowel   . C. difficile colitis 2011  . Allergic rhinitis     Followed by Dr. Donneta Romberg  . Esophageal reflux     Followed by GI physician, on Dexilant and Ranitidine  . Migraine   . 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 Osteoporosis 2009    on Reclast once yearly, Followed by Dr. Jefm Bryant  . Ovarian cancer ([place])   . COPD (chronic obstructive pulmonary disease) ([place])   . Thrombocytopenia ([place])   . MVP (mitral valve prolapse)   . SVT (supraventricular tachycardia) ([place])     PAST SURGICAL HISTORY: Past Surgical History  Procedure  Laterality Date  . Bil roken wrists    . Gallbladder removed    . Bilateral bunionectomies    . Cesarean section    . Abdominal hysterectomy    . Cholecystectomy      FAMILY HISTORY: Unchanged. No reported history of malignancy or chronic disease.     ADVANCED DIRECTIVES:    HEALTH MAINTENANCE: Social History  Substance Use Topics  . Smoking status: Former Smoker    Quit date: 06/12/1989  . Smokeless tobacco: Never Used  . Alcohol Use: No     Colonoscopy:  PAP:  Bone density:  Lipid panel:  Allergies  Allergen Reactions  . Aspirin     Other reaction(s): Localized superficial swelling of skin uncoated asa-causes throat to swell up  . Codeine Itching    Other reaction(s): Itching of Skin  . Fosamax  [Alendronate Sodium]     Other reaction(s): Distress (finding)  . Ibuprofen   . Ibuprofen     Other reaction(s): Distress (finding)  . Metronidazole Nausea And Vomiting  . Naproxen     Other reaction(s): Distress (finding)  . Naproxen Sodium   . Nsaids     GI UPSET / GERD  . Oxycodone     Other reaction(s): Diarrhea and vomiting (finding)  . Oxycodone-Acetaminophen     Other reaction(s): Diarrhea and vomiting (finding)    Current Outpatient Prescriptions  Medication Sig Dispense Refill  . Acetaminophen 500 MG coapsule Take 1,000 mg by mouth every 6 (six) hours as needed for fever.    . Calcium Carb-Cholecalciferol (CALCIUM 1000 + D  PO) Take by mouth daily.      Marland Kitchen dexamethasone (DECADRON) 4 MG tablet Take 1 tablet (4 mg total) by mouth daily. 60 tablet 2  . DimenhyDRINATE (DRAMAMINE PO) Take by mouth.    . Diphenhyd-Hydrocort-Nystatin (FIRST-DUKES MOUTHWASH) SUSP Use as directed 5 mLs in the mouth or throat 4 (four) times daily as needed. 1 Bottle 1  . ELIQUIS 5 MG TABS tablet take 1 tablet by mouth twice a day 60 tablet 0  . furosemide (LASIX) 20 MG tablet take 1 tablet by mouth once daily 30 tablet 5  . Multiple Vitamin (MULTIVITAMIN) tablet Take 1 tablet by  mouth daily.      Marland Kitchen omeprazole (PRILOSEC) 20 MG capsule Take 20 mg by mouth 2 (two) times daily before a meal.    . phenazopyridine (PYRIDIUM) 200 MG tablet Take 1 tablet (200 mg total) by mouth 3 (three) times daily as needed for pain. 10 tablet 0  . potassium chloride SA (K-DUR,KLOR-CON) 20 MEQ tablet take 1 tablet by mouth twice a day 60 tablet 1  . Probiotic Product (PRO-BIOTIC BLEND) CAPS Take 1 capsule by mouth daily.    . prochlorperazine (COMPAZINE) 10 MG tablet Take 10 mg by mouth 3 (three) times daily.  0  . topiramate (TOPAMAX) 25 MG tablet TAKE 1-2 TABLETS BY MOUTH DAILY 60 tablet 5  . traMADol (ULTRAM) 50 MG tablet Take 1 tablet (50 mg total) by mouth every 6 (six) hours as needed. 60 tablet 1  . zolpidem (AMBIEN) 5 MG tablet Take 1 tablet (5 mg total) by mouth at bedtime as needed for sleep. 1/2-1 tab 30 tablet 1  . [DISCONTINUED] dexlansoprazole (DEXILANT) 60 MG capsule Take 60 mg by mouth daily.       No current facility-administered medications for this visit.    OBJECTIVE: Filed Vitals:   04/27/15 0944  BP: 102/67  Pulse: 86  Temp: 97.5 F (36.4 C)  Resp: 16     Body mass index is 20.08 kg/(m^2).    ECOG FS:1 - Symptomatic but completely ambulatory  General: Well-developed, well-nourished, no acute distress.  Eyes: anicteric sclera. Lungs: Clear to auscultation bilaterally. Heart: Regular rate and rhythm. No rubs, murmurs, or gallops. Abdomen: Soft, nontender, nondistended. No organomegaly noted, normoactive bowel sounds. Musculoskeletal: 1-2+ bilateral lower extremity edema. Neuro: Alert, answering all questions appropriately. Cranial nerves grossly intact. Skin: No rashes or petechiae noted. Psych: Normal affect.   LAB RESULTS:  Lab Results  Component Value Date   NA 132* 04/27/2015   K 3.7 04/27/2015   CL 105 04/27/2015   CO2 21* 04/27/2015   GLUCOSE 95 04/27/2015   BUN 24* 04/27/2015   CREATININE 0.77 04/27/2015   CALCIUM 9.0 04/27/2015   PROT 6.2*  04/27/2015   ALBUMIN 3.5 04/27/2015   AST 29 04/27/2015   ALT 26 04/27/2015   ALKPHOS 55 04/27/2015   BILITOT 1.1 04/27/2015   GFRNONAA >60 04/27/2015   GFRAA >60 04/27/2015    Lab Results  Component Value Date   WBC 4.6 04/27/2015   NEUTROABS 3.6 04/27/2015   HGB 12.1 04/27/2015   HCT 35.0 04/27/2015   MCV 102.5* 04/27/2015   PLT 111* 04/27/2015     STUDIES: No results found.  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  PLAN:    1. Ovarian cancer: PET scan results from February 19, 2015 reviewed independently and report progression of disease. CA-125 is slowly trending up and is now 69.1  Proceed with cycle  2, day 15 of Taxotere 25 mg/m2.  She will receive this regimen on days 1, 8, 15 with day 22 off provided her pancytopenia remains adequate to treat. Return to clinic in 2 week for consideration of cycle 3, day 1. Will reimage in approximately Decemeber 2016.   2. Peripheral edema: Likely multifactorial. Continue diuretic as prescribed and recommendations per lymphedema clinic.  3. Weakness and fatigue/decreased performance status: Unchanged, monitor. Patient will receive 1 L of IV fluids today. 4. Pancytopenia: Proceed cautiously with treatment as above.  5. Diarrhea: Continue Imodium as needed. 6. Rash: Continue over-the-counter hydrocortisone cream and Benadryl as needed. Patient was given a prescription for decadron today.   Patient expressed understanding and was in agreement with this plan. She also understands that She can call clinic at any time with any questions, concerns, or complaints.    Lloyd Huger, MD   05/02/2015 7:40 AM

## 2015-05-10 ENCOUNTER — Other Ambulatory Visit: Payer: Self-pay | Admitting: Oncology

## 2015-05-11 ENCOUNTER — Inpatient Hospital Stay: Payer: Medicare Other

## 2015-05-11 ENCOUNTER — Inpatient Hospital Stay (HOSPITAL_BASED_OUTPATIENT_CLINIC_OR_DEPARTMENT_OTHER): Payer: Medicare Other | Admitting: Oncology

## 2015-05-11 VITALS — BP 104/69 | HR 79 | Temp 97.1°F | Resp 16 | Wt 121.9 lb

## 2015-05-11 DIAGNOSIS — R5381 Other malaise: Secondary | ICD-10-CM

## 2015-05-11 DIAGNOSIS — C7931 Secondary malignant neoplasm of brain: Secondary | ICD-10-CM

## 2015-05-11 DIAGNOSIS — R531 Weakness: Secondary | ICD-10-CM

## 2015-05-11 DIAGNOSIS — D61818 Other pancytopenia: Secondary | ICD-10-CM | POA: Diagnosis not present

## 2015-05-11 DIAGNOSIS — R21 Rash and other nonspecific skin eruption: Secondary | ICD-10-CM | POA: Diagnosis not present

## 2015-05-11 DIAGNOSIS — Z5111 Encounter for antineoplastic chemotherapy: Secondary | ICD-10-CM | POA: Diagnosis not present

## 2015-05-11 DIAGNOSIS — I890 Lymphedema, not elsewhere classified: Secondary | ICD-10-CM

## 2015-05-11 DIAGNOSIS — R600 Localized edema: Secondary | ICD-10-CM

## 2015-05-11 DIAGNOSIS — R5383 Other fatigue: Secondary | ICD-10-CM

## 2015-05-11 DIAGNOSIS — R197 Diarrhea, unspecified: Secondary | ICD-10-CM

## 2015-05-11 DIAGNOSIS — R42 Dizziness and giddiness: Secondary | ICD-10-CM

## 2015-05-11 DIAGNOSIS — C569 Malignant neoplasm of unspecified ovary: Secondary | ICD-10-CM | POA: Diagnosis not present

## 2015-05-11 DIAGNOSIS — G629 Polyneuropathy, unspecified: Secondary | ICD-10-CM

## 2015-05-11 LAB — CBC WITH DIFFERENTIAL/PLATELET
BASOS ABS: 0 10*3/uL (ref 0–0.1)
BASOS PCT: 0 %
EOS PCT: 0 %
Eosinophils Absolute: 0 10*3/uL (ref 0–0.7)
HEMATOCRIT: 36.3 % (ref 35.0–47.0)
Hemoglobin: 12.5 g/dL (ref 12.0–16.0)
LYMPHS PCT: 9 %
Lymphs Abs: 0.5 10*3/uL — ABNORMAL LOW (ref 1.0–3.6)
MCH: 35.7 pg — ABNORMAL HIGH (ref 26.0–34.0)
MCHC: 34.5 g/dL (ref 32.0–36.0)
MCV: 103.7 fL — AB (ref 80.0–100.0)
Monocytes Absolute: 0.3 10*3/uL (ref 0.2–0.9)
Monocytes Relative: 6 %
NEUTROS ABS: 4.4 10*3/uL (ref 1.4–6.5)
Neutrophils Relative %: 85 %
PLATELETS: 110 10*3/uL — AB (ref 150–440)
RBC: 3.5 MIL/uL — AB (ref 3.80–5.20)
RDW: 14.1 % (ref 11.5–14.5)
WBC: 5.2 10*3/uL (ref 3.6–11.0)

## 2015-05-11 LAB — BASIC METABOLIC PANEL
ANION GAP: 6 (ref 5–15)
BUN: 20 mg/dL (ref 6–20)
CALCIUM: 9 mg/dL (ref 8.9–10.3)
CHLORIDE: 104 mmol/L (ref 101–111)
CO2: 22 mmol/L (ref 22–32)
Creatinine, Ser: 0.91 mg/dL (ref 0.44–1.00)
GFR calc non Af Amer: >60 mL/min (ref >60)
GLUCOSE: 89 mg/dL (ref 65–99)
Potassium: 4.2 mmol/L (ref 3.5–5.1)
Sodium: 132 mmol/L — ABNORMAL LOW (ref 135–145)

## 2015-05-11 MED ORDER — DEXAMETHASONE SODIUM PHOSPHATE 100 MG/10ML IJ SOLN
Freq: Once | INTRAMUSCULAR | Status: AC
  Administered 2015-05-11: 11:00:00 via INTRAVENOUS
  Filled 2015-05-11: qty 4

## 2015-05-11 MED ORDER — SODIUM CHLORIDE 0.9 % IJ SOLN
10.0000 mL | INTRAMUSCULAR | Status: DC | PRN
  Administered 2015-05-11: 10 mL via INTRAVENOUS
  Filled 2015-05-11: qty 10

## 2015-05-11 MED ORDER — SODIUM CHLORIDE 0.9 % IV SOLN
Freq: Once | INTRAVENOUS | Status: AC
  Administered 2015-05-11: 12:00:00 via INTRAVENOUS
  Filled 2015-05-11: qty 1000

## 2015-05-11 MED ORDER — SODIUM CHLORIDE 0.9 % IV SOLN
Freq: Once | INTRAVENOUS | Status: AC
  Administered 2015-05-11: 11:00:00 via INTRAVENOUS
  Filled 2015-05-11: qty 1000

## 2015-05-11 MED ORDER — DOCETAXEL CHEMO INJECTION 160 MG/16ML
40.0000 mg | Freq: Once | INTRAVENOUS | Status: AC
  Administered 2015-05-11: 40 mg via INTRAVENOUS
  Filled 2015-05-11: qty 4

## 2015-05-11 MED ORDER — DOCETAXEL CHEMO INJECTION 160 MG/16ML: 25.0000 mg/m2 | Freq: Once | INTRAVENOUS | Status: DC

## 2015-05-11 MED ORDER — HEPARIN SOD (PORK) LOCK FLUSH 100 UNIT/ML IV SOLN
500.0000 [IU] | Freq: Once | INTRAVENOUS | Status: AC
  Administered 2015-05-11: 500 [IU] via INTRAVENOUS
  Filled 2015-05-11: qty 5

## 2015-05-11 MED ORDER — DIPHENHYDRAMINE HCL 50 MG/ML IJ SOLN
12.5000 mg | Freq: Once | INTRAMUSCULAR | Status: AC
  Administered 2015-05-11: 12.5 mg via INTRAVENOUS
  Filled 2015-05-11: qty 1

## 2015-05-11 NOTE — Progress Notes (Signed)
Patient is feeling well today and is able to be active at home.  Does have bilateral leg edema R>L.  Had an episode of diarrhea last night that was relieved with OTC Immodium.

## 2015-05-15 NOTE — Progress Notes (Signed)
[place]  Telephone:(336) [phone] Fax:(336) [phone]  ID: Amanda Robbins OB: 1941/02/02  MR#: [id number]  [id number]  Patient Care Team: Jackolyn Confer, MD as PCP - General (Internal Medicine)  CHIEF COMPLAINT:  Chief Complaint  Patient presents with  . Ovarian Cancer    INTERVAL HISTORY: Patient returns to clinic today for further evaluation and consideration of cycle 3, day 1 of single agent Taxotere. Her rash has improved. Her weakness and fatigue are unchanged. 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 She denies any fevers. She denies any chest pain or shortness of breath. She has a fair appetite and denies weight loss. She denies any nausea, vomiting, or constipation. She had one episode of diarrhea which was well controlled with Imodium. Patient offers no further specific complaints.   REVIEW OF SYSTEMS:   Review of Systems  Constitutional: Positive for malaise/fatigue. Negative for weight loss.  Cardiovascular: Positive for leg swelling.  Gastrointestinal: Positive for diarrhea.  Genitourinary: Negative.   Musculoskeletal: Negative.   Neurological: Positive for sensory change and weakness.    As per HPI. Otherwise, a complete review of systems is negatve.  PAST MEDICAL HISTORY: Past Medical History  Diagnosis Date  . Ischemic bowel syndrome ([place]) 06/2010  . Irritable bowel   . C. difficile colitis 2011  . Allergic rhinitis     Followed by Dr. Donneta Romberg  . Esophageal reflux     Followed by GI physician, on Dexilant and Ranitidine  . Migraine   . 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 Osteoporosis 2009    on Reclast once yearly, Followed by Dr. Jefm Bryant  . Ovarian cancer ([place])   . COPD (chronic obstructive pulmonary disease) ([place])   . Thrombocytopenia ([place])   . MVP (mitral valve prolapse)   . SVT (supraventricular tachycardia) ([place])     PAST  SURGICAL HISTORY: Past Surgical History  Procedure Laterality Date  . Bil roken wrists    . Gallbladder removed    . Bilateral bunionectomies    . Cesarean section    . Abdominal hysterectomy    . Cholecystectomy      FAMILY HISTORY: Unchanged. No reported history of malignancy or chronic disease.     ADVANCED DIRECTIVES:    HEALTH MAINTENANCE: Social History  Substance Use Topics  . Smoking status: Former Smoker    Quit date: 06/12/1989  . Smokeless tobacco: Never Used  . Alcohol Use: No     Colonoscopy:  PAP:  Bone density:  Lipid panel:  Allergies  Allergen Reactions  . Aspirin     Other reaction(s): Localized superficial swelling of skin uncoated asa-causes throat to swell up  . Codeine Itching    Other reaction(s): Itching of Skin  . Fosamax  [Alendronate Sodium]     Other reaction(s): Distress (finding)  . Ibuprofen   . Ibuprofen     Other reaction(s): Distress (finding)  . Metronidazole Nausea And Vomiting  . Naproxen     Other reaction(s): Distress (finding)  . Naproxen Sodium   . Nsaids     GI UPSET / GERD  . Oxycodone     Other reaction(s): Diarrhea and vomiting (finding)  . Oxycodone-Acetaminophen     Other reaction(s): Diarrhea and vomiting (finding)    Current Outpatient Prescriptions  Medication Sig Dispense Refill  . Acetaminophen 500 MG coapsule Take 1,000 mg by mouth every 6 (six) hours as needed for  fever.    . Calcium Carb-Cholecalciferol (CALCIUM 1000 + D PO) Take by mouth daily.      Marland Kitchen dexamethasone (DECADRON) 4 MG tablet Take 1 tablet (4 mg total) by mouth daily. 60 tablet 2  . DimenhyDRINATE (DRAMAMINE PO) Take by mouth.    . Diphenhyd-Hydrocort-Nystatin (FIRST-DUKES MOUTHWASH) SUSP Use as directed 5 mLs in the mouth or throat 4 (four) times daily as needed. 1 Bottle 1  . ELIQUIS 5 MG TABS tablet take 1 tablet by mouth twice a day 60 tablet 0  . furosemide (LASIX) 20 MG tablet take 1 tablet by mouth once daily 30 tablet 5  .  Multiple Vitamin (MULTIVITAMIN) tablet Take 1 tablet by mouth daily.      Marland Kitchen omeprazole (PRILOSEC) 20 MG capsule Take 20 mg by mouth 2 (two) times daily before a meal.    . phenazopyridine (PYRIDIUM) 200 MG tablet Take 1 tablet (200 mg total) by mouth 3 (three) times daily as needed for pain. 10 tablet 0  . potassium chloride SA (K-DUR,KLOR-CON) 20 MEQ tablet take 1 tablet by mouth twice a day 60 tablet 1  . Probiotic Product (PRO-BIOTIC BLEND) CAPS Take 1 capsule by mouth daily.    . prochlorperazine (COMPAZINE) 10 MG tablet Take 10 mg by mouth 3 (three) times daily.  0  . topiramate (TOPAMAX) 25 MG tablet TAKE 1-2 TABLETS BY MOUTH DAILY 60 tablet 5  . traMADol (ULTRAM) 50 MG tablet Take 1 tablet (50 mg total) by mouth every 6 (six) hours as needed. 60 tablet 1  . zolpidem (AMBIEN) 5 MG tablet Take 1 tablet (5 mg total) by mouth at bedtime as needed for sleep. 1/2-1 tab 30 tablet 1  . [DISCONTINUED] dexlansoprazole (DEXILANT) 60 MG capsule Take 60 mg by mouth daily.       No current facility-administered medications for this visit.    OBJECTIVE: Filed Vitals:   05/11/15 1135  BP: 104/69  Pulse: 79  Temp: 97.1 F (36.2 C)  Resp: 16     Body mass index is 19.83 kg/(m^2).    ECOG FS:1 - Symptomatic but completely ambulatory  General: Well-developed, well-nourished, no acute distress.  Eyes: anicteric sclera. Lungs: Clear to auscultation bilaterally. Heart: Regular rate and rhythm. No rubs, murmurs, or gallops. Abdomen: Soft, nontender, nondistended. No organomegaly noted, normoactive bowel sounds. Musculoskeletal: 2+ bilateral lower extremity edema. Neuro: Alert, answering all questions appropriately. Cranial nerves grossly intact. Skin: No rashes or petechiae noted. Psych: Normal affect.   LAB RESULTS:  Lab Results  Component Value Date   NA 132* 05/11/2015   K 4.2 05/11/2015   CL 104 05/11/2015   CO2 22 05/11/2015   GLUCOSE 89 05/11/2015   BUN 20 05/11/2015   CREATININE  0.91 05/11/2015   CALCIUM 9.0 05/11/2015   PROT 6.2* 04/27/2015   ALBUMIN 3.5 04/27/2015   AST 29 04/27/2015   ALT 26 04/27/2015   ALKPHOS 55 04/27/2015   BILITOT 1.1 04/27/2015   GFRNONAA >60 05/11/2015   GFRAA >60 05/11/2015    Lab Results  Component Value Date   WBC 5.2 05/11/2015   NEUTROABS 4.4 05/11/2015   HGB 12.5 05/11/2015   HCT 36.3 05/11/2015   MCV 103.7* 05/11/2015   PLT 110* 05/11/2015     STUDIES: No results found.  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  PLAN:    1. Ovarian cancer: PET scan results from February 19, 2015 reviewed independently and report progression of disease. CA-125 is  slowly trending up and is now 69.1  Proceed with cycle 3, day 1 of Taxotere 25 mg/m2.  She will receive this regimen on days 1, 8, 15 with day 22 off provided her pancytopenia remains adequate to treat. Return to clinic in 1 week for consideration of cycle 3, day 8. Will reimage in approximately Decemeber 2016 at the conclusion of cycle 3.   2. Peripheral edema: Likely multifactorial. Continue diuretic as prescribed and recommendations per lymphedema clinic.  3. Weakness and fatigue/decreased performance status: Unchanged, monitor. Patient will receive 1 L of IV fluids today. 4. Thrombocytopenia: Proceed cautiously with treatment as above.  5. Diarrhea: Continue Imodium as needed. 6. Rash: Continue over-the-counter hydrocortisone cream, dexamethasone, and Benadryl as needed.   Patient expressed understanding and was in agreement with this plan. She also understands that She can call clinic at any time with any questions, concerns, or complaints.    Lloyd Huger, MD   05/15/2015 5:38 PM

## 2015-05-18 ENCOUNTER — Inpatient Hospital Stay: Payer: Medicare Other | Attending: Oncology

## 2015-05-18 ENCOUNTER — Inpatient Hospital Stay (HOSPITAL_BASED_OUTPATIENT_CLINIC_OR_DEPARTMENT_OTHER): Payer: Medicare Other | Admitting: Oncology

## 2015-05-18 ENCOUNTER — Inpatient Hospital Stay: Payer: Medicare Other

## 2015-05-18 VITALS — BP 111/71 | HR 76 | Resp 18

## 2015-05-18 VITALS — BP 122/73 | HR 101 | Temp 99.2°F | Resp 16 | Wt 124.8 lb

## 2015-05-18 DIAGNOSIS — I341 Nonrheumatic mitral (valve) prolapse: Secondary | ICD-10-CM | POA: Diagnosis not present

## 2015-05-18 DIAGNOSIS — R600 Localized edema: Secondary | ICD-10-CM | POA: Insufficient documentation

## 2015-05-18 DIAGNOSIS — R5383 Other fatigue: Secondary | ICD-10-CM | POA: Insufficient documentation

## 2015-05-18 DIAGNOSIS — M5137 Other intervertebral disc degeneration, lumbosacral region: Secondary | ICD-10-CM

## 2015-05-18 DIAGNOSIS — K219 Gastro-esophageal reflux disease without esophagitis: Secondary | ICD-10-CM | POA: Insufficient documentation

## 2015-05-18 DIAGNOSIS — G629 Polyneuropathy, unspecified: Secondary | ICD-10-CM

## 2015-05-18 DIAGNOSIS — Z87891 Personal history of nicotine dependence: Secondary | ICD-10-CM | POA: Insufficient documentation

## 2015-05-18 DIAGNOSIS — C569 Malignant neoplasm of unspecified ovary: Secondary | ICD-10-CM | POA: Diagnosis not present

## 2015-05-18 DIAGNOSIS — J449 Chronic obstructive pulmonary disease, unspecified: Secondary | ICD-10-CM

## 2015-05-18 DIAGNOSIS — D696 Thrombocytopenia, unspecified: Secondary | ICD-10-CM

## 2015-05-18 DIAGNOSIS — R531 Weakness: Secondary | ICD-10-CM | POA: Insufficient documentation

## 2015-05-18 DIAGNOSIS — R21 Rash and other nonspecific skin eruption: Secondary | ICD-10-CM

## 2015-05-18 DIAGNOSIS — C7931 Secondary malignant neoplasm of brain: Secondary | ICD-10-CM

## 2015-05-18 DIAGNOSIS — Z5111 Encounter for antineoplastic chemotherapy: Secondary | ICD-10-CM | POA: Insufficient documentation

## 2015-05-18 DIAGNOSIS — Z79899 Other long term (current) drug therapy: Secondary | ICD-10-CM | POA: Insufficient documentation

## 2015-05-18 DIAGNOSIS — R197 Diarrhea, unspecified: Secondary | ICD-10-CM | POA: Diagnosis not present

## 2015-05-18 DIAGNOSIS — M5489 Other dorsalgia: Secondary | ICD-10-CM

## 2015-05-18 LAB — COMPREHENSIVE METABOLIC PANEL
ALK PHOS: 58 U/L (ref 38–126)
ALT: 30 U/L (ref 14–54)
ANION GAP: 6 (ref 5–15)
AST: 27 U/L (ref 15–41)
Albumin: 3.3 g/dL — ABNORMAL LOW (ref 3.5–5.0)
BILIRUBIN TOTAL: 0.9 mg/dL (ref 0.3–1.2)
BUN: 27 mg/dL — ABNORMAL HIGH (ref 6–20)
CALCIUM: 8.7 mg/dL — AB (ref 8.9–10.3)
CO2: 21 mmol/L — ABNORMAL LOW (ref 22–32)
CREATININE: 0.75 mg/dL (ref 0.44–1.00)
Chloride: 101 mmol/L (ref 101–111)
GFR calc non Af Amer: >60 mL/min (ref >60)
GLUCOSE: 127 mg/dL — AB (ref 65–99)
Potassium: 3.6 mmol/L (ref 3.5–5.1)
Sodium: 128 mmol/L — ABNORMAL LOW (ref 135–145)
TOTAL PROTEIN: 6 g/dL — AB (ref 6.5–8.1)

## 2015-05-18 LAB — CBC WITH DIFFERENTIAL/PLATELET
Basophils Absolute: 0 10*3/uL (ref 0–0.1)
Basophils Relative: 0 %
EOS ABS: 0 10*3/uL (ref 0–0.7)
EOS PCT: 0 %
HCT: 37 % (ref 35.0–47.0)
Hemoglobin: 12.8 g/dL (ref 12.0–16.0)
LYMPHS ABS: 0.5 10*3/uL — AB (ref 1.0–3.6)
Lymphocytes Relative: 11 %
MCH: 35.5 pg — AB (ref 26.0–34.0)
MCHC: 34.5 g/dL (ref 32.0–36.0)
MCV: 102.9 fL — AB (ref 80.0–100.0)
MONO ABS: 0.2 10*3/uL (ref 0.2–0.9)
Monocytes Relative: 5 %
Neutro Abs: 4 10*3/uL (ref 1.4–6.5)
Neutrophils Relative %: 84 %
PLATELETS: 98 10*3/uL — AB (ref 150–440)
RBC: 3.6 MIL/uL — AB (ref 3.80–5.20)
RDW: 13.9 % (ref 11.5–14.5)
WBC: 4.8 10*3/uL (ref 3.6–11.0)

## 2015-05-18 MED ORDER — SODIUM CHLORIDE 0.9 % IV SOLN
Freq: Once | INTRAVENOUS | Status: AC
  Administered 2015-05-18: 11:00:00 via INTRAVENOUS
  Filled 2015-05-18: qty 4

## 2015-05-18 MED ORDER — DEXTROSE 5 % IV SOLN: 25.0000 mg/m2 | Freq: Once | INTRAVENOUS | Status: DC

## 2015-05-18 MED ORDER — SODIUM CHLORIDE 0.9 % IV SOLN
Freq: Once | INTRAVENOUS | Status: AC
  Administered 2015-05-18: 11:00:00 via INTRAVENOUS
  Filled 2015-05-18: qty 1000

## 2015-05-18 MED ORDER — DIPHENHYDRAMINE HCL 50 MG/ML IJ SOLN
12.5000 mg | Freq: Once | INTRAMUSCULAR | Status: AC
  Administered 2015-05-18: 12.5 mg via INTRAVENOUS
  Filled 2015-05-18: qty 1

## 2015-05-18 MED ORDER — HEPARIN SOD (PORK) LOCK FLUSH 100 UNIT/ML IV SOLN
500.0000 [IU] | Freq: Once | INTRAVENOUS | Status: AC | PRN
  Administered 2015-05-18: 500 [IU]
  Filled 2015-05-18: qty 5

## 2015-05-18 MED ORDER — DOCETAXEL CHEMO INJECTION 160 MG/16ML
25.0000 mg/m2 | Freq: Once | INTRAVENOUS | Status: AC
  Administered 2015-05-18: 40 mg via INTRAVENOUS
  Filled 2015-05-18: qty 4

## 2015-05-18 MED ORDER — SODIUM CHLORIDE 0.9 % IV SOLN
INTRAVENOUS | Status: DC
  Administered 2015-05-18: 12:00:00 via INTRAVENOUS
  Filled 2015-05-18: qty 1000

## 2015-05-18 NOTE — Progress Notes (Signed)
Patient was cleaning her house 4 days ago when she lifted something heave and hurt her back mainly on left side. Wend to the chiropractor yesterday that made the pain worse.  The pain is worse with bending over.  Wants to discuss tapering off the Eliquis and steroid.

## 2015-05-19 LAB — CA 125: CA 125: 51 U/mL — AB (ref 0.0–38.1)

## 2015-05-25 ENCOUNTER — Inpatient Hospital Stay: Payer: Medicare Other

## 2015-05-25 ENCOUNTER — Inpatient Hospital Stay (HOSPITAL_BASED_OUTPATIENT_CLINIC_OR_DEPARTMENT_OTHER): Payer: Medicare Other | Admitting: Oncology

## 2015-05-25 VITALS — BP 99/68 | HR 97 | Temp 98.9°F | Resp 16 | Wt 125.9 lb

## 2015-05-25 DIAGNOSIS — Z5111 Encounter for antineoplastic chemotherapy: Secondary | ICD-10-CM | POA: Diagnosis not present

## 2015-05-25 DIAGNOSIS — Z79899 Other long term (current) drug therapy: Secondary | ICD-10-CM

## 2015-05-25 DIAGNOSIS — R5383 Other fatigue: Secondary | ICD-10-CM

## 2015-05-25 DIAGNOSIS — C569 Malignant neoplasm of unspecified ovary: Secondary | ICD-10-CM | POA: Diagnosis not present

## 2015-05-25 DIAGNOSIS — G629 Polyneuropathy, unspecified: Secondary | ICD-10-CM

## 2015-05-25 DIAGNOSIS — M5489 Other dorsalgia: Secondary | ICD-10-CM

## 2015-05-25 DIAGNOSIS — R197 Diarrhea, unspecified: Secondary | ICD-10-CM

## 2015-05-25 DIAGNOSIS — D696 Thrombocytopenia, unspecified: Secondary | ICD-10-CM

## 2015-05-25 DIAGNOSIS — R21 Rash and other nonspecific skin eruption: Secondary | ICD-10-CM

## 2015-05-25 DIAGNOSIS — R531 Weakness: Secondary | ICD-10-CM

## 2015-05-25 DIAGNOSIS — C7931 Secondary malignant neoplasm of brain: Secondary | ICD-10-CM

## 2015-05-25 DIAGNOSIS — J449 Chronic obstructive pulmonary disease, unspecified: Secondary | ICD-10-CM

## 2015-05-25 DIAGNOSIS — R600 Localized edema: Secondary | ICD-10-CM

## 2015-05-25 LAB — COMPREHENSIVE METABOLIC PANEL
ALBUMIN: 3.2 g/dL — AB (ref 3.5–5.0)
ALT: 32 U/L (ref 14–54)
ANION GAP: 7 (ref 5–15)
AST: 27 U/L (ref 15–41)
Alkaline Phosphatase: 69 U/L (ref 38–126)
BUN: 25 mg/dL — ABNORMAL HIGH (ref 6–20)
CHLORIDE: 101 mmol/L (ref 101–111)
CO2: 23 mmol/L (ref 22–32)
CREATININE: 0.78 mg/dL (ref 0.44–1.00)
Calcium: 8.6 mg/dL — ABNORMAL LOW (ref 8.9–10.3)
GFR calc non Af Amer: >60 mL/min (ref >60)
Glucose, Bld: 90 mg/dL (ref 65–99)
Potassium: 3.8 mmol/L (ref 3.5–5.1)
SODIUM: 131 mmol/L — AB (ref 135–145)
TOTAL PROTEIN: 5.9 g/dL — AB (ref 6.5–8.1)
Total Bilirubin: 1.1 mg/dL (ref 0.3–1.2)

## 2015-05-25 LAB — CBC WITH DIFFERENTIAL/PLATELET
BASOS PCT: 0 %
Basophils Absolute: 0 10*3/uL (ref 0–0.1)
EOS ABS: 0 10*3/uL (ref 0–0.7)
EOS PCT: 0 %
HCT: 35.1 % (ref 35.0–47.0)
HEMOGLOBIN: 12.2 g/dL (ref 12.0–16.0)
Lymphocytes Relative: 10 %
Lymphs Abs: 0.4 10*3/uL — ABNORMAL LOW (ref 1.0–3.6)
MCH: 35.5 pg — AB (ref 26.0–34.0)
MCHC: 34.8 g/dL (ref 32.0–36.0)
MCV: 102 fL — ABNORMAL HIGH (ref 80.0–100.0)
MONO ABS: 0.3 10*3/uL (ref 0.2–0.9)
MONOS PCT: 7 %
NEUTROS PCT: 83 %
Neutro Abs: 3.5 10*3/uL (ref 1.4–6.5)
PLATELETS: 111 10*3/uL — AB (ref 150–440)
RBC: 3.44 MIL/uL — ABNORMAL LOW (ref 3.80–5.20)
RDW: 14.6 % — AB (ref 11.5–14.5)
WBC: 4.2 10*3/uL (ref 3.6–11.0)

## 2015-05-25 MED ORDER — SODIUM CHLORIDE 0.9 % IJ SOLN
10.0000 mL | Freq: Once | INTRAMUSCULAR | Status: AC
  Administered 2015-05-25: 10 mL via INTRAVENOUS
  Filled 2015-05-25: qty 10

## 2015-05-25 MED ORDER — DIPHENHYDRAMINE HCL 50 MG/ML IJ SOLN
12.5000 mg | Freq: Once | INTRAMUSCULAR | Status: AC
  Administered 2015-05-25: 12.5 mg via INTRAVENOUS
  Filled 2015-05-25: qty 1

## 2015-05-25 MED ORDER — DOCETAXEL CHEMO INJECTION 160 MG/16ML
25.0000 mg/m2 | Freq: Once | INTRAVENOUS | Status: AC
  Administered 2015-05-25: 40 mg via INTRAVENOUS
  Filled 2015-05-25: qty 4

## 2015-05-25 MED ORDER — SODIUM CHLORIDE 0.9 % IV SOLN
Freq: Once | INTRAVENOUS | Status: AC
  Administered 2015-05-25: 11:00:00 via INTRAVENOUS
  Filled 2015-05-25: qty 1000

## 2015-05-25 MED ORDER — SODIUM CHLORIDE 0.9 % IV SOLN
INTRAVENOUS | Status: DC
  Filled 2015-05-25: qty 1000

## 2015-05-25 MED ORDER — SODIUM CHLORIDE 0.9 % IV SOLN
Freq: Once | INTRAVENOUS | Status: AC
  Administered 2015-05-25: 11:00:00 via INTRAVENOUS
  Filled 2015-05-25: qty 4

## 2015-05-25 MED ORDER — HEPARIN SOD (PORK) LOCK FLUSH 100 UNIT/ML IV SOLN
500.0000 [IU] | Freq: Once | INTRAVENOUS | Status: AC
  Administered 2015-05-25: 500 [IU] via INTRAVENOUS

## 2015-05-25 MED ORDER — SODIUM CHLORIDE 0.9 % IJ SOLN
10.0000 mL | INTRAMUSCULAR | Status: DC | PRN
  Administered 2015-05-25: 10 mL via INTRAVENOUS
  Filled 2015-05-25: qty 10

## 2015-05-25 MED ORDER — HEPARIN SOD (PORK) LOCK FLUSH 100 UNIT/ML IV SOLN
INTRAVENOUS | Status: AC
  Filled 2015-05-25: qty 5

## 2015-05-25 NOTE — Progress Notes (Signed)
Patient still having significant back pain with the pain at an 8/10 on pain scale today.

## 2015-05-29 ENCOUNTER — Emergency Department: Payer: Medicare Other

## 2015-05-29 ENCOUNTER — Encounter: Payer: Self-pay | Admitting: Emergency Medicine

## 2015-05-29 ENCOUNTER — Inpatient Hospital Stay
Admission: EM | Admit: 2015-05-29 | Discharge: 2015-06-02 | DRG: 854 | Disposition: A | Discharge status: Home Health | Payer: Medicare Other | Attending: Specialist | Admitting: Specialist

## 2015-05-29 DIAGNOSIS — S32039A Unspecified fracture of third lumbar vertebra, initial encounter for closed fracture: Secondary | ICD-10-CM | POA: Diagnosis present

## 2015-05-29 DIAGNOSIS — M549 Dorsalgia, unspecified: Secondary | ICD-10-CM | POA: Diagnosis present

## 2015-05-29 DIAGNOSIS — M545 Low back pain: Secondary | ICD-10-CM | POA: Diagnosis present

## 2015-05-29 DIAGNOSIS — E440 Moderate protein-calorie malnutrition: Secondary | ICD-10-CM | POA: Diagnosis present

## 2015-05-29 DIAGNOSIS — A4181 Sepsis due to Enterococcus: Secondary | ICD-10-CM | POA: Diagnosis present

## 2015-05-29 DIAGNOSIS — Z6826 Body mass index (BMI) 26.0-26.9, adult: Secondary | ICD-10-CM

## 2015-05-29 DIAGNOSIS — J449 Chronic obstructive pulmonary disease, unspecified: Secondary | ICD-10-CM | POA: Diagnosis present

## 2015-05-29 DIAGNOSIS — L03115 Cellulitis of right lower limb: Secondary | ICD-10-CM | POA: Diagnosis not present

## 2015-05-29 DIAGNOSIS — X58XXXA Exposure to other specified factors, initial encounter: Secondary | ICD-10-CM | POA: Diagnosis present

## 2015-05-29 DIAGNOSIS — A419 Sepsis, unspecified organism: Secondary | ICD-10-CM | POA: Diagnosis present

## 2015-05-29 DIAGNOSIS — I341 Nonrheumatic mitral (valve) prolapse: Secondary | ICD-10-CM | POA: Diagnosis present

## 2015-05-29 DIAGNOSIS — Z8543 Personal history of malignant neoplasm of ovary: Secondary | ICD-10-CM | POA: Diagnosis not present

## 2015-05-29 DIAGNOSIS — D649 Anemia, unspecified: Secondary | ICD-10-CM | POA: Diagnosis present

## 2015-05-29 DIAGNOSIS — Z86711 Personal history of pulmonary embolism: Secondary | ICD-10-CM | POA: Diagnosis not present

## 2015-05-29 DIAGNOSIS — K219 Gastro-esophageal reflux disease without esophagitis: Secondary | ICD-10-CM | POA: Diagnosis present

## 2015-05-29 DIAGNOSIS — Z79899 Other long term (current) drug therapy: Secondary | ICD-10-CM

## 2015-05-29 DIAGNOSIS — B370 Candidal stomatitis: Secondary | ICD-10-CM | POA: Diagnosis present

## 2015-05-29 DIAGNOSIS — C569 Malignant neoplasm of unspecified ovary: Secondary | ICD-10-CM | POA: Diagnosis not present

## 2015-05-29 DIAGNOSIS — Z7901 Long term (current) use of anticoagulants: Secondary | ICD-10-CM | POA: Diagnosis not present

## 2015-05-29 DIAGNOSIS — C7931 Secondary malignant neoplasm of brain: Secondary | ICD-10-CM | POA: Diagnosis present

## 2015-05-29 DIAGNOSIS — Z85038 Personal history of other malignant neoplasm of large intestine: Secondary | ICD-10-CM | POA: Diagnosis not present

## 2015-05-29 DIAGNOSIS — M810 Age-related osteoporosis without current pathological fracture: Secondary | ICD-10-CM | POA: Diagnosis present

## 2015-05-29 DIAGNOSIS — Z419 Encounter for procedure for purposes other than remedying health state, unspecified: Secondary | ICD-10-CM

## 2015-05-29 DIAGNOSIS — Z885 Allergy status to narcotic agent status: Secondary | ICD-10-CM

## 2015-05-29 DIAGNOSIS — D6959 Other secondary thrombocytopenia: Secondary | ICD-10-CM | POA: Diagnosis present

## 2015-05-29 DIAGNOSIS — D693 Immune thrombocytopenic purpura: Secondary | ICD-10-CM | POA: Diagnosis present

## 2015-05-29 DIAGNOSIS — N390 Urinary tract infection, site not specified: Secondary | ICD-10-CM | POA: Diagnosis present

## 2015-05-29 DIAGNOSIS — Z87891 Personal history of nicotine dependence: Secondary | ICD-10-CM

## 2015-05-29 DIAGNOSIS — Z886 Allergy status to analgesic agent status: Secondary | ICD-10-CM | POA: Diagnosis not present

## 2015-05-29 DIAGNOSIS — E785 Hyperlipidemia, unspecified: Secondary | ICD-10-CM | POA: Diagnosis present

## 2015-05-29 DIAGNOSIS — C7940 Secondary malignant neoplasm of unspecified part of nervous system: Secondary | ICD-10-CM | POA: Diagnosis present

## 2015-05-29 DIAGNOSIS — D696 Thrombocytopenia, unspecified: Secondary | ICD-10-CM | POA: Diagnosis present

## 2015-05-29 DIAGNOSIS — S32029A Unspecified fracture of second lumbar vertebra, initial encounter for closed fracture: Secondary | ICD-10-CM | POA: Diagnosis present

## 2015-05-29 DIAGNOSIS — Z9221 Personal history of antineoplastic chemotherapy: Secondary | ICD-10-CM | POA: Diagnosis not present

## 2015-05-29 DIAGNOSIS — R7881 Bacteremia: Secondary | ICD-10-CM | POA: Diagnosis not present

## 2015-05-29 DIAGNOSIS — S32000A Wedge compression fracture of unspecified lumbar vertebra, initial encounter for closed fracture: Secondary | ICD-10-CM

## 2015-05-29 DIAGNOSIS — B952 Enterococcus as the cause of diseases classified elsewhere: Secondary | ICD-10-CM | POA: Diagnosis not present

## 2015-05-29 LAB — CBC WITH DIFFERENTIAL/PLATELET
Basophils Absolute: 0 10*3/uL (ref 0–0.1)
EOS ABS: 0 10*3/uL (ref 0–0.7)
HCT: 33.9 % — ABNORMAL LOW (ref 35.0–47.0)
HEMOGLOBIN: 11.7 g/dL — AB (ref 12.0–16.0)
Lymphocytes Relative: 7 %
Lymphs Abs: 0.3 10*3/uL — ABNORMAL LOW (ref 1.0–3.6)
MCH: 36 pg — ABNORMAL HIGH (ref 26.0–34.0)
MCHC: 34.6 g/dL (ref 32.0–36.0)
MCV: 103.9 fL — ABNORMAL HIGH (ref 80.0–100.0)
Monocytes Absolute: 0.1 10*3/uL — ABNORMAL LOW (ref 0.2–0.9)
Neutro Abs: 3.5 10*3/uL (ref 1.4–6.5)
PLATELETS: 92 10*3/uL — AB (ref 150–440)
RBC: 3.26 MIL/uL — ABNORMAL LOW (ref 3.80–5.20)
RDW: 15.1 % — AB (ref 11.5–14.5)
WBC: 3.9 10*3/uL (ref 3.6–11.0)

## 2015-05-29 LAB — COMPREHENSIVE METABOLIC PANEL
ALBUMIN: 3 g/dL — AB (ref 3.5–5.0)
ALK PHOS: 65 U/L (ref 38–126)
ALT: 32 U/L (ref 14–54)
ANION GAP: 5 (ref 5–15)
AST: 33 U/L (ref 15–41)
BUN: 23 mg/dL — ABNORMAL HIGH (ref 6–20)
CHLORIDE: 103 mmol/L (ref 101–111)
CO2: 26 mmol/L (ref 22–32)
Calcium: 8.8 mg/dL — ABNORMAL LOW (ref 8.9–10.3)
Creatinine, Ser: 0.91 mg/dL (ref 0.44–1.00)
GFR calc Af Amer: >60 mL/min (ref >60)
GFR calc non Af Amer: >60 mL/min (ref >60)
GLUCOSE: 95 mg/dL (ref 65–99)
POTASSIUM: 3.4 mmol/L — AB (ref 3.5–5.1)
SODIUM: 134 mmol/L — AB (ref 135–145)
TOTAL PROTEIN: 5.7 g/dL — AB (ref 6.5–8.1)
Total Bilirubin: 1.3 mg/dL — ABNORMAL HIGH (ref 0.3–1.2)

## 2015-05-29 LAB — URINALYSIS COMPLETE WITH MICROSCOPIC (ARMC ONLY)
BILIRUBIN URINE: NEGATIVE
Glucose, UA: NEGATIVE mg/dL
Hgb urine dipstick: NEGATIVE
KETONES UR: NEGATIVE mg/dL
Nitrite: NEGATIVE
PH: 7 (ref 5.0–8.0)
Protein, ur: NEGATIVE mg/dL
Specific Gravity, Urine: 1.01 (ref 1.005–1.030)

## 2015-05-29 LAB — LACTIC ACID, PLASMA
Lactic Acid, Venous: 3.1 mmol/L (ref 0.5–2.0)
Lactic Acid, Venous: 1.3 mmol/L (ref 0.5–2.0)
Lactic Acid, Venous: 3.4 mmol/L (ref 0.5–2.0)
Lactic Acid, Venous: 1.3 mmol/L (ref 0.5–2.0)

## 2015-05-29 LAB — LIPASE, BLOOD: LIPASE: 15 U/L (ref 11–51)

## 2015-05-29 MED ORDER — VANCOMYCIN HCL IN DEXTROSE 1-5 GM/200ML-% IV SOLN
1000.0000 mg | Freq: Once | INTRAVENOUS | Status: AC
  Administered 2015-05-29: 1000 mg via INTRAVENOUS
  Filled 2015-05-29: qty 200

## 2015-05-29 MED ORDER — RISAQUAD PO CAPS
1.0000 | ORAL_CAPSULE | Freq: Every day | ORAL | Status: DC
  Administered 2015-05-30 – 2015-06-02 (×3): 1 via ORAL
  Filled 2015-05-29 (×3): qty 1

## 2015-05-29 MED ORDER — ADULT MULTIVITAMIN W/MINERALS CH
1.0000 | ORAL_TABLET | Freq: Every day | ORAL | Status: DC
  Administered 2015-05-30 – 2015-06-02 (×3): 1 via ORAL
  Filled 2015-05-29 (×3): qty 1

## 2015-05-29 MED ORDER — ACETAMINOPHEN 500 MG PO TABS: 1000.0000 mg | ORAL_TABLET | Freq: Four times a day (QID) | ORAL | Status: DC | PRN

## 2015-05-29 MED ORDER — ZOLPIDEM TARTRATE 5 MG PO TABS
5.0000 mg | ORAL_TABLET | Freq: Every evening | ORAL | Status: DC | PRN
  Administered 2015-05-29 – 2015-06-01 (×3): 5 mg via ORAL
  Filled 2015-05-29 (×3): qty 1

## 2015-05-29 MED ORDER — ONDANSETRON HCL 4 MG/2ML IJ SOLN: 4.0000 mg | Freq: Four times a day (QID) | INTRAMUSCULAR | Status: DC | PRN

## 2015-05-29 MED ORDER — PRO-BIOTIC BLEND PO CAPS: 1.0000 | ORAL_CAPSULE | Freq: Every day | ORAL | Status: DC

## 2015-05-29 MED ORDER — SODIUM CHLORIDE 0.9 % IV SOLN
Freq: Once | INTRAVENOUS | Status: AC
  Administered 2015-05-29: 16:00:00 via INTRAVENOUS

## 2015-05-29 MED ORDER — POTASSIUM CHLORIDE CRYS ER 20 MEQ PO TBCR
20.0000 meq | EXTENDED_RELEASE_TABLET | Freq: Two times a day (BID) | ORAL | Status: DC
  Administered 2015-05-29 – 2015-06-02 (×7): 20 meq via ORAL
  Filled 2015-05-29 (×7): qty 1

## 2015-05-29 MED ORDER — ALBUTEROL SULFATE (2.5 MG/3ML) 0.083% IN NEBU: 2.5000 mg | INHALATION_SOLUTION | RESPIRATORY_TRACT | Status: DC | PRN

## 2015-05-29 MED ORDER — CALCIUM CARBONATE 1500 (600 CA) MG PO TABS
600.0000 mg | ORAL_TABLET | Freq: Two times a day (BID) | ORAL | Status: DC
  Filled 2015-05-29: qty 1

## 2015-05-29 MED ORDER — FUROSEMIDE 20 MG PO TABS
20.0000 mg | ORAL_TABLET | Freq: Every day | ORAL | Status: DC
  Administered 2015-05-29 – 2015-06-02 (×4): 20 mg via ORAL
  Filled 2015-05-29 (×4): qty 1

## 2015-05-29 MED ORDER — VANCOMYCIN HCL IN DEXTROSE 1-5 GM/200ML-% IV SOLN
1000.0000 mg | INTRAVENOUS | Status: AC
  Administered 2015-05-30 – 2015-05-31 (×2): 1000 mg via INTRAVENOUS
  Filled 2015-05-29 (×4): qty 200

## 2015-05-29 MED ORDER — SODIUM CHLORIDE 0.9 % IV BOLUS (SEPSIS)
1000.0000 mL | Freq: Once | INTRAVENOUS | Status: AC
  Administered 2015-05-29: 1000 mL via INTRAVENOUS

## 2015-05-29 MED ORDER — MORPHINE SULFATE (PF) 2 MG/ML IV SOLN
2.0000 mg | Freq: Once | INTRAVENOUS | Status: AC
  Administered 2015-05-29: 2 mg via INTRAVENOUS
  Filled 2015-05-29: qty 1

## 2015-05-29 MED ORDER — SODIUM CHLORIDE 0.9 % IJ SOLN
3.0000 mL | Freq: Two times a day (BID) | INTRAMUSCULAR | Status: DC
  Administered 2015-05-29 – 2015-06-01 (×4): 3 mL via INTRAVENOUS

## 2015-05-29 MED ORDER — PANTOPRAZOLE SODIUM 40 MG PO TBEC
40.0000 mg | DELAYED_RELEASE_TABLET | Freq: Every day | ORAL | Status: DC
Start: 2015-05-29 — End: 2015-06-02
  Administered 2015-05-29 – 2015-06-02 (×4): 40 mg via ORAL
  Filled 2015-05-29 (×4): qty 1

## 2015-05-29 MED ORDER — IOHEXOL 350 MG/ML SOLN
80.0000 mL | Freq: Once | INTRAVENOUS | Status: AC | PRN
  Administered 2015-05-29: 80 mL via INTRAVENOUS

## 2015-05-29 MED ORDER — ACETAMINOPHEN 500 MG PO CAPS: 1000.0000 mg | ORAL_CAPSULE | Freq: Four times a day (QID) | ORAL | Status: DC | PRN

## 2015-05-29 MED ORDER — DEXAMETHASONE 4 MG PO TABS
4.0000 mg | ORAL_TABLET | Freq: Every day | ORAL | Status: DC
  Administered 2015-05-30 – 2015-06-02 (×3): 4 mg via ORAL
  Filled 2015-05-29 (×4): qty 1

## 2015-05-29 MED ORDER — SODIUM CHLORIDE 0.9 % IV SOLN
INTRAVENOUS | Status: DC
  Administered 2015-05-29 – 2015-06-02 (×8): via INTRAVENOUS

## 2015-05-29 MED ORDER — TRAMADOL HCL 50 MG PO TABS
50.0000 mg | ORAL_TABLET | Freq: Four times a day (QID) | ORAL | Status: DC | PRN
  Administered 2015-06-01 – 2015-06-02 (×3): 50 mg via ORAL
  Filled 2015-05-29 (×3): qty 1

## 2015-05-29 MED ORDER — MORPHINE SULFATE (PF) 2 MG/ML IV SOLN
2.0000 mg | INTRAVENOUS | Status: DC | PRN
  Administered 2015-05-29 – 2015-06-01 (×12): 2 mg via INTRAVENOUS
  Filled 2015-05-29 (×12): qty 1

## 2015-05-29 MED ORDER — TOPIRAMATE 25 MG PO TABS
25.0000 mg | ORAL_TABLET | Freq: Every day | ORAL | Status: DC
  Administered 2015-05-29 – 2015-06-01 (×4): 25 mg via ORAL
  Filled 2015-05-29 (×6): qty 1

## 2015-05-29 MED ORDER — TOPIRAMATE 25 MG PO TABS: 25.0000 mg | ORAL_TABLET | Freq: Every day | ORAL | Status: DC

## 2015-05-29 MED ORDER — ONDANSETRON HCL 4 MG PO TABS
4.0000 mg | ORAL_TABLET | Freq: Four times a day (QID) | ORAL | Status: DC | PRN
Start: 2015-05-29 — End: 2015-06-02

## 2015-05-29 MED ORDER — CALCIUM CARBONATE-VITAMIN D 500-200 MG-UNIT PO TABS
1.0000 | ORAL_TABLET | Freq: Two times a day (BID) | ORAL | Status: DC
  Administered 2015-05-29 – 2015-06-02 (×7): 1 via ORAL
  Filled 2015-05-29 (×7): qty 1

## 2015-05-29 MED ORDER — APIXABAN 5 MG PO TABS
5.0000 mg | ORAL_TABLET | Freq: Two times a day (BID) | ORAL | Status: DC
  Administered 2015-05-29: 5 mg via ORAL
  Filled 2015-05-29 (×2): qty 1

## 2015-05-29 NOTE — ED Notes (Signed)
Pt to ed with c/o lower back pain x 2 weeks.  Pt states she twisted while lifting a drawer about 2 weeks ago and the pain has continued.

## 2015-05-29 NOTE — Consult Note (Signed)
Patient is seen for evaluation of low back pain. She suffered a twisting injury about 2 weeks ago while lifting. She is having back pain since then. On review of prior PET scan from 03/03/2015 and CT [facility] today, there are new fractures of L2 and L4. She has been having significant pain and is now admitted because she is unable to ambulate secondary to severe pain. Additionally she has edema and apparent cellulitis of both lower extremities. Additionally she has been undergoing treatment for metastatic ovarian cancer including brain habits  On exam she has swelling of both lower extremities from the knee down with erythema and warmth no open wounds no clonus and able flex extend the toes. She is very tender at L4 less tender at L2  Impression is new compression fractures from 2 weeks ago apparently by history at L2 and L4 with worsening pain. Recommendation and plan is to obtain an MRI lumbar spine and discuss possible kyphoplasty indication on the MRI or from Dr. Grayland Ormond her oncologist

## 2015-05-29 NOTE — Progress Notes (Signed)
ANTIBIOTIC CONSULT NOTE - INITIAL  Pharmacy Consult for vancomycin Indication: rule out sepsis/cellulitis  Allergies  Allergen Reactions  . Aspirin     Other reaction(s): Localized superficial swelling of skin uncoated asa-causes throat to swell up  . Codeine Itching    Other reaction(s): Itching of Skin  . Fosamax  [Alendronate Sodium]     Other reaction(s): Distress (finding)  . Ibuprofen   . Ibuprofen     Other reaction(s): Distress (finding)  . Metronidazole Nausea And Vomiting  . Naproxen     Other reaction(s): Distress (finding)  . Naproxen Sodium   . Nsaids     GI UPSET / GERD  . Oxycodone     Other reaction(s): Diarrhea and vomiting (finding)  . Oxycodone-Acetaminophen     Other reaction(s): Diarrhea and vomiting (finding)   Patient Measurements: Weight: 124 lb (56.246 kg)  Vital Signs: Temp: 97.7 F (36.5 C) (12/17 1103) Temp Source: Oral (12/17 1103) BP: 135/57 mmHg (12/17 1600) Pulse Rate: 109 (12/17 1600)  Labs:  Recent Labs  05/29/15 1149  WBC 3.9  HGB 11.7*  PLT 92*  CREATININE 0.91   Estimated Creatinine Clearance: 49.6 mL/min (by C-G formula based on Cr of 0.91). No results for input(s): VANCOTROUGH, VANCOPEAK, VANCORANDOM, GENTTROUGH, GENTPEAK, GENTRANDOM, TOBRATROUGH, TOBRAPEAK, TOBRARND, AMIKACINPEAK, AMIKACINTROU, AMIKACIN in the last 72 hours.   Microbiology: No results found for this or any previous visit (from the past 720 hour(s)).  Medications:  Anti-infectives    Start     Dose/Rate Route Frequency Ordered Stop   05/30/15 0100  vancomycin (VANCOCIN) IVPB 1000 mg/200 mL premix     1,000 mg 200 mL/hr over 60 Minutes Intravenous Every 24 hours 05/29/15 1658     05/29/15 1500  vancomycin (VANCOCIN) IVPB 1000 mg/200 mL premix     1,000 mg 200 mL/hr over 60 Minutes Intravenous  Once 05/29/15 1452 05/29/15 1630     Assessment: Pharmacy consulted to dose vancomycin in this 74 year old female being admitted for sepsis 2/2 cellulitis.    CrCl ~ 49 mL/min Use actual body weight = 56.2 kg  Kinetics: Ke: 0.045 Half-life: 15.4 hours Vd: 39.3L  Cmin (calculated): ~16.9 mcg/mL  Goal of Therapy:  Vancomycin trough level 15-20 mcg/ml  Plan:  Measure antibiotic drug levels at steady state Follow up culture results Patient received 1 g vancomycin in the ED Will follow with vancomycin 1 g IV q 24 hours (with stacked dose to begin 9 hours after initial dose was given) Vancomycin trough ordered for 12/20 at 0030 prior to 4th dose  Pharmacy will continue to monitor, thank you for the consult.  Darylene Price Terance Pomplun 05/29/2015,4:59 PM

## 2015-05-29 NOTE — ED Notes (Signed)
Lab called regarding add on urine culture, will add on at this time  

## 2015-05-29 NOTE — ED Notes (Signed)
Pt assisted to toilet at this time, tolerating well, no acute distress noted at this time

## 2015-05-29 NOTE — ED Notes (Signed)
RN tried to call report, RN taking report currently in a rapid response, will call this RN back momentarily.

## 2015-05-29 NOTE — Progress Notes (Signed)
Pt. admitted to unit, rm 232, report from [place] ED RN. Oriented to room, call bell, Ascom phones and staff. Bed in low position. Fall safety plan reviewed, contract signed and placed on wall, yellow non-skid socks in place, bed alarm on. Full assessment to Epic, cellulitis noted to R lower leg, 3+ edema, redness and warm to the touch (assessed with Thomas Hoff, RN). Tele box verified with tele clerk and Grier Rocher, NT: [phone]. Oncology floor called and asked to come access patients PAC to decrease amount of sticks pt will have to endure, onc. RN to come next shift. Will continue to monitor.

## 2015-05-29 NOTE — ED Notes (Signed)
Patient transported to CT 

## 2015-05-29 NOTE — ED Notes (Signed)
MD Reddy at bedside.

## 2015-05-29 NOTE — H&P (Signed)
[place] [facility] [place] NAME: Amanda Robbins    MR#:  [id number]  DATE OF BIRTH:  1940/11/12  DATE OF ADMISSION:  05/29/2015  PRIMARY CARE PHYSICIAN: Rica Mast, MD   REQUESTING/REFERRING PHYSICIAN: Doren Custard                                                                    CHIEF COMPLAINT:   Chief Complaint  Patient presents with  . Back Pain   right lower extremity swelling and redness  HISTORY OF PRESENT ILLNESS:  Amanda Robbins  is a 74 y.o. 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 Patient states when she was trying to lift some weight 2 weeks ago she developed back pain which continued since then and worsened today. Denies any focal weakness or numbness, bladder or bowel disturbances. Denies any fever, chills, chest pain,, nausea, vomiting, diarrhea, dysuria, abdominal pain. On arrival to the ED, patient was noted to be hypotensive with blood pressure 79/45 and tachycardic with heart rate of 115 and temperature of 97. degrees Fahrenheit. She was also noted to have redness and swelling of right lower extremity with increased local warmth. Lab work revealed WBC of 3.9, potassium 3.4 and lactate 3.1 and platelets 92K which is chronic. Urinalysis WBC 6-30 with trace LE. CT of the chest abdomen and the pelvis reported negative for PE/ infilltrate, acute intra-abdominal pathology but l2,l4 compression deformity. She was given 2 L of IV fluids following which her blood pressure improved and currently is maintaining around 122/65. Heart rate also improved and currently maintaining running around 100-105. After obtaining blood cultures, patient was started on IV vancomycin with the diagnosis of sepsis due to cellulitis and hospitalist service was consulted for further  management.  At the current time patient is resting in the bed, does not appear to be in any acute distress, states feeling slightly better.  PAST MEDICAL HISTORY:   Past Medical History  Diagnosis Date  . 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 Irritable bowel   . C. difficile colitis 2011  . Allergic rhinitis     Followed by Dr. Donneta Romberg  . Esophageal reflux     Followed by GI physician, on Dexilant and Ranitidine  . Migraine   . 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 Osteoporosis 2009    on Reclast once yearly, Followed by Dr. Jefm Bryant  . Ovarian cancer ([place])   . COPD (chronic obstructive pulmonary disease) ([place])   . Thrombocytopenia ([place])   . MVP (mitral valve prolapse)   . SVT (supraventricular tachycardia) ([place])     PAST SURGICAL HISTORY:   Past Surgical History  Procedure Laterality Date  . Bil roken wrists    . Gallbladder removed    . Bilateral bunionectomies    . Cesarean section    . Abdominal hysterectomy    . Cholecystectomy      SOCIAL HISTORY:   Social History  Substance Use Topics  . Smoking status: Former Smoker    Quit date: 06/12/1989  . Smokeless tobacco: Never  Used  . Alcohol Use: No    FAMILY HISTORY:   Family History  Problem Relation Age of Onset  . Ovarian cancer Maternal Grandmother 27  . Breast cancer Maternal Aunt 70    DRUG ALLERGIES:   Allergies  Allergen Reactions  . Aspirin     Other reaction(s): Localized superficial swelling of skin uncoated asa-causes throat to swell up  . Codeine Itching    Other reaction(s): Itching of Skin  . Fosamax  [Alendronate Sodium]     Other reaction(s): Distress (finding)  . Ibuprofen   . Ibuprofen     Other reaction(s): Distress (finding)  . Metronidazole Nausea And Vomiting  . Naproxen     Other reaction(s): Distress (finding)  . Naproxen Sodium   . Nsaids     GI UPSET / GERD  . Oxycodone     Other reaction(s): Diarrhea and vomiting (finding)   . Oxycodone-Acetaminophen     Other reaction(s): Diarrhea and vomiting (finding)    REVIEW OF SYSTEMS:   Review of Systems  Constitutional: Positive for malaise/fatigue. Negative for fever and chills.  HENT: Negative for ear pain, hearing loss, nosebleeds, sore throat and tinnitus.   Eyes: Negative for blurred vision, double vision, pain, discharge and redness.  Respiratory: Negative for cough, hemoptysis, sputum production, shortness of breath and wheezing.   Cardiovascular: Negative for chest pain, palpitations, orthopnea and leg swelling.  Gastrointestinal: Negative for nausea, vomiting, abdominal pain, diarrhea, constipation, blood in stool and melena.  Genitourinary: Negative for dysuria, urgency, frequency and hematuria.  Musculoskeletal: Positive for back pain. Negative for joint pain and neck pain.  Skin: Negative for itching and rash.       Right lower extremity redness and pain has noted in history of present illness.  Neurological: Negative for dizziness, tingling, sensory change, focal weakness and seizures.  Endo/Heme/Allergies: Does not bruise/bleed easily.  Psychiatric/Behavioral: Negative for depression. The patient is not nervous/anxious.     MEDICATIONS AT HOME:   Prior to Admission medications   Medication Sig Start Date End Date Taking? Authorizing Provider  Acetaminophen 500 MG coapsule Take 1,000 mg by mouth every 6 (six) hours as needed for fever.   Yes Historical Provider, MD  Calcium Carbonate (CALCARB 600 PO) Take 600 mg by mouth 2 (two) times daily.   Yes Historical Provider, MD  dexamethasone (DECADRON) 4 MG tablet Take 1 tablet (4 mg total) by mouth daily. 04/13/15  Yes Lloyd Huger, MD  DimenhyDRINATE (DRAMAMINE PO) Take 1 tablet by mouth daily as needed (motion sickness.).    Yes Historical Provider, MD  ELIQUIS 5 MG TABS tablet take 1 tablet by mouth twice a day 05/10/15  Yes Lloyd Huger, MD  furosemide (LASIX) 20 MG tablet take 1 tablet by  mouth once daily 01/12/15  Yes Jackolyn Confer, MD  Multiple Vitamin (MULTIVITAMIN) tablet Take 1 tablet by mouth daily.     Yes Historical Provider, MD  naproxen sodium (ALEVE) 220 MG tablet Take 220 mg by mouth 2 (two) times daily as needed (for pain.).   Yes Historical Provider, MD  omeprazole (PRILOSEC) 20 MG capsule Take 20 mg by mouth 2 (two) times daily before a meal.   Yes Historical Provider, MD  potassium chloride SA (K-DUR,KLOR-CON) 20 MEQ tablet take 1 tablet by mouth twice a day 04/07/15  Yes Lloyd Huger, MD  Probiotic Product (PRO-BIOTIC BLEND) CAPS Take 1 capsule by mouth daily.   Yes Historical Provider, MD  topiramate (TOPAMAX) 25 MG tablet  TAKE 1-2 TABLETS BY MOUTH DAILY Patient taking differently: TAKE 1 TABLETS BY MOUTH DAILY 03/02/15  Yes Jackolyn Confer, MD  traMADol (ULTRAM) 50 MG tablet Take 1 tablet (50 mg total) by mouth every 6 (six) hours as needed. Patient taking differently: Take 50 mg by mouth every 6 (six) hours as needed for moderate pain or severe pain.  03/16/15  Yes Lloyd Huger, MD  zolpidem (AMBIEN) 5 MG tablet Take 1 tablet (5 mg total) by mouth at bedtime as needed for sleep. 1/2-1 tab 04/20/15  Yes Lloyd Huger, MD  Diphenhyd-Hydrocort-Nystatin (FIRST-DUKES MOUTHWASH) SUSP Use as directed 5 mLs in the mouth or throat 4 (four) times daily as needed. 03/25/15   Lloyd Huger, MD  phenazopyridine (PYRIDIUM) 200 MG tablet Take 1 tablet (200 mg total) by mouth 3 (three) times daily as needed for pain. 04/05/15   Lloyd Huger, MD      VITAL SIGNS:  Blood pressure 150/72, pulse 106, temperature 97.7 F (36.5 C), temperature source Oral, resp. rate 20, weight 56.246 kg (124 lb), SpO2 100 %.  PHYSICAL EXAMINATION:  Physical Exam  Constitutional: She is oriented to person, place, and time.  Thin built, chronically ill-looking, in mild distress because of back pain  HENT:  Head: Normocephalic and atraumatic.  Right Ear: External ear  normal.  Left Ear: External ear normal.  Nose: Nose normal.  Mouth/Throat: Oropharynx is clear and moist. No oropharyngeal exudate.  Eyes: EOM are normal. Pupils are equal, round, and reactive to light. No scleral icterus.  Neck: Normal range of motion. Neck supple. No JVD present. No thyromegaly present.  Cardiovascular: Regular rhythm, normal heart sounds and intact distal pulses.  Exam reveals no friction rub.   No murmur heard. Tachycardia +  Respiratory: Effort normal and breath sounds normal. No respiratory distress. She has no wheezes. She has no rales. She exhibits no tenderness.  GI: Soft. Bowel sounds are normal. She exhibits no distension and no mass. There is no tenderness. There is no rebound and no guarding.  Musculoskeletal: Normal range of motion. She exhibits edema.  Lymphadenopathy:    She has no cervical adenopathy.  Neurological: She is alert and oriented to person, place, and time. She has normal reflexes. She displays normal reflexes. No cranial nerve deficit. She exhibits normal muscle tone.  Skin: Skin is warm. No rash noted. There is erythema (Right lower extremity with local tenderness +).  Psychiatric: She has a normal mood and affect. Her behavior is normal. Thought content normal.   LABORATORY PANEL:   CBC  Recent Labs Lab 05/29/15 1149  WBC 3.9  HGB 11.7*  HCT 33.9*  PLT 92*   ------------------------------------------------------------------------------------------------------------------  Chemistries   Recent Labs Lab 05/29/15 1149  NA 134*  K 3.4*  CL 103  CO2 26  GLUCOSE 95  BUN 23*  CREATININE 0.91  CALCIUM 8.8*  AST 33  ALT 32  ALKPHOS 65  BILITOT 1.3*   ------------------------------------------------------------------------------------------------------------------  Cardiac Enzymes No results for input(s): TROPONINI in the last 168  hours. ------------------------------------------------------------------------------------------------------------------  RADIOLOGY:  Ct Angio Chest Pe W/cm &/or Wo Cm  05/29/2015  CLINICAL DATA:  Back pain for 2 weeks EXAM: CT ANGIOGRAPHY CHEST, ABDOMEN AND PELVIS TECHNIQUE: Multidetector CT imaging through the chest, abdomen and pelvis was performed using the standard protocol during bolus administration of intravenous contrast. Multiplanar reconstructed images and MIPs were obtained and reviewed to evaluate the vascular anatomy. CONTRAST:  9mL OMNIPAQUE IOHEXOL 350 MG/ML SOLN COMPARISON:  03/03/2015. FINDINGS: CTA CHEST FINDINGS Lungs are well aerated bilaterally. Biapical subpleural and parenchymal scarring is noted. No sizable infiltrate is seen. Mild scarring is noted inferiorly in the right lung as well. The thoracic inlet is within normal limits. The thoracic aorta and its branches show mild atherosclerotic change. No aneurysmal dilatation or dissection is identified. The pulmonary artery as visualized is within normal limits. No significant hilar or mediastinal adenopathy is noted. No acute bony abnormality is noted. Review of the MIP images confirms the above findings. CTA ABDOMEN AND PELVIS FINDINGS The gallbladder has been surgically removed. The liver is diffusely decreased in attenuation consistent with fatty infiltration. The spleen, adrenal glands and pancreas are within normal limits. Kidneys are well visualized without evidence renal calculi or urinary tract obstructive change. A left retro aortic renal vein is noted. The urinary bladder is within normal limits. Mild diverticular change is noted without evidence of diverticulitis. The appendix is not well seen. No inflammatory changes are noted. The uterus has been surgically removed. The abdominal aorta shows a normal branching pattern. Mild aortic calcifications are seen. No aneurysm or dissection is identified. The osseous structures  show compression deformity of the superior endplate of L2 and inferior endplate of L4. These changes are new from the prior exam. Review of the MIP images confirms the above findings. IMPRESSION: No evidence of dissection or aneurysmal dilatation. No evidence of pulmonary embolism. L2 and L4 compression deformities as described which are new from September of 2016. They may be related to the patient's recent injury. Electronically Signed   By: Inez Catalina M.D.   On: 05/29/2015 14:22   Ct Cta Abd/pel W/cm &/or W/o Cm  05/29/2015  CLINICAL DATA:  Back pain for 2 weeks EXAM: CT ANGIOGRAPHY CHEST, ABDOMEN AND PELVIS TECHNIQUE: Multidetector CT imaging through the chest, abdomen and pelvis was performed using the standard protocol during bolus administration of intravenous contrast. Multiplanar reconstructed images and MIPs were obtained and reviewed to evaluate the vascular anatomy. CONTRAST:  49mL OMNIPAQUE IOHEXOL 350 MG/ML SOLN COMPARISON:  03/03/2015. FINDINGS: CTA CHEST FINDINGS Lungs are well aerated bilaterally. Biapical subpleural and parenchymal scarring is noted. No sizable infiltrate is seen. Mild scarring is noted inferiorly in the right lung as well. The thoracic inlet is within normal limits. The thoracic aorta and its branches show mild atherosclerotic change. No aneurysmal dilatation or dissection is identified. The pulmonary artery as visualized is within normal limits. No significant hilar or mediastinal adenopathy is noted. No acute bony abnormality is noted. Review of the MIP images confirms the above findings. CTA ABDOMEN AND PELVIS FINDINGS The gallbladder has been surgically removed. The liver is diffusely decreased in attenuation consistent with fatty infiltration. The spleen, adrenal glands and pancreas are within normal limits. Kidneys are well visualized without evidence renal calculi or urinary tract obstructive change. A left retro aortic renal vein is noted. The urinary bladder is  within normal limits. Mild diverticular change is noted without evidence of diverticulitis. The appendix is not well seen. No inflammatory changes are noted. The uterus has been surgically removed. The abdominal aorta shows a normal branching pattern. Mild aortic calcifications are seen. No aneurysm or dissection is identified. The osseous structures show compression deformity of the superior endplate of L2 and inferior endplate of L4. These changes are new from the prior exam. Review of the MIP images confirms the above findings. IMPRESSION: No evidence of dissection or aneurysmal dilatation. No evidence of pulmonary embolism. L2 and L4 compression deformities as  described which are new from September of 2016. They may be related to the patient's recent injury. Electronically Signed   By: Inez Catalina M.D.   On: 05/29/2015 14:22    EKG:   Orders placed or performed in visit on 07/21/14  . EKG 12-Lead    IMPRESSION AND PLAN:   74 year old female with history of metastatic colon cancer undergoing chemotherapy and radiation treatment presents with the complaints of low back pain of 2 weeks' duration following lifting some weight, hypotensive and tachycardic on arrival, labs with elevated lactic acid of 3.1, suspected sepsis and right lower extremity cellulitis.  1. Hypotension and tachycardia with elevated lactic acid. Suspect sepsis secondary to right lower extremity cellulitis. Hypotension resolved following IV fluids. 2. Right lower extremity cellulitis. Plan: Admit, continue IV fluids, IV antibiotics-vancomycin, follow blood pressure closely, telemetry monitoring because of history of SVT. Follow-up CBC, blood cultures. 3. Back pain for the past 2 weeks following lifting some weight. CT of the abdomen reveals L2 L4 compression deformity. No focal neurological deficit. Plan: Pain control meds, orthopedic consultation requested for further evaluation and advice. 4. 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 Oncology consultation requested. 5. 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 Patient stable. Monitor.  6. Abnormal UA. Patient without uti sx. Get urine culture sensitivity. 7. History of PE, on eliquis. CTA chest negative for PE. 8. Migraine, stable on home medication. Continue same.     All the records are reviewed and case discussed with ED provider. Management plans discussed with the patient, family and they are in agreement.  CODE STATUS: Full code  TOTAL TIME TAKING CARE OF THIS PATIENT: 50 minutes.    Juluis Mire M.D on 05/29/2015 at 4:05 PM  Between 7am to 6pm - Pager - [phone]  After 6pm go to [email or web address] - password [email or web address]  Office  [phone]  CC: Primary care physician; Rica Mast, MD

## 2015-05-29 NOTE — ED Provider Notes (Signed)
[facility] Emergency Department Provider Note  ____________________________________________  Time seen: 12:15 PM  I have reviewed the triage vital signs and the nursing notes.   HISTORY  Chief Complaint Back Pain    HPI ABIAGEAL YUAN is a 74 y.o. female who complains of generalized weakness and lower back pain for 2 weeks. The pain started suddenly and severely when she was turning her body and trying to lift a drawer. Since then the pain has been constant and gradually worsening. She has no lower extremity numbness [place] weakness or bowel or bladder incontinence or retention. She denies fevers or chills. She does have ovarian cancer getting chemotherapy and radiation for brain metastases. She reports that she frequently needs IV fluids from Dr. Aida Raider due to poor fluid intake.     Past Medical History  Diagnosis Date  . 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 Irritable bowel   . C. difficile colitis 2011  . Allergic rhinitis     Followed by Dr. Donneta Romberg  . Esophageal reflux     Followed by GI physician, on Dexilant and Ranitidine  . Migraine   . 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 Osteoporosis 2009    on Reclast once yearly, Followed by Dr. Jefm Bryant  . Ovarian cancer ([place])   . COPD (chronic obstructive pulmonary disease) ([place])   . Thrombocytopenia ([place])   . MVP (mitral valve prolapse)   . SVT (supraventricular tachycardia) [facility])      Patient Active Problem List   Diagnosis Date Noted  . Macrocytic anemia 12/01/2014  . Ovarian cancer ([place]) 08/31/2014  . 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 Brain metastasis ([place]) 06/01/2014  . Dizzinesses 05/26/2013  . Medicare annual wellness visit, subsequent 11/22/2011  . GERD (gastroesophageal reflux disease) 08/22/2011  . Neuropathy ([place]) 05/24/2011  . Insomnia 05/24/2011  . Osteoporosis 05/24/2011  . IBS (irritable bowel syndrome)  05/24/2011  . Migraine 05/24/2011  . Thrombocytopenia ([place]) 05/24/2011  . Hyperlipidemia 05/24/2011     Past Surgical History  Procedure Laterality Date  . Bil roken wrists    . Gallbladder removed    . Bilateral bunionectomies    . Cesarean section    . Abdominal hysterectomy    . Cholecystectomy       Current Outpatient Rx  Name  Route  Sig  Dispense  Refill  . Acetaminophen 500 MG coapsule   Oral   Take 1,000 mg by mouth every 6 (six) hours as needed for fever.         . Calcium Carb-Cholecalciferol (CALCIUM 1000 + D PO)   Oral   Take by mouth daily.           Marland Kitchen dexamethasone (DECADRON) 4 MG tablet   Oral   Take 1 tablet (4 mg total) by mouth daily.   60 tablet   2   . DimenhyDRINATE (DRAMAMINE PO)   Oral   Take by mouth.         . Diphenhyd-Hydrocort-Nystatin (FIRST-DUKES MOUTHWASH) SUSP   Mouth/Throat   Use as directed 5 mLs in the mouth or throat 4 (four) times daily as needed.   1 Bottle   1   . ELIQUIS 5 MG TABS tablet      take 1 tablet by mouth twice a day   60 tablet   0     Dispense as written.   . furosemide (LASIX) 20 MG tablet  take 1 tablet by mouth once daily   30 tablet   5   . Multiple Vitamin (MULTIVITAMIN) tablet   Oral   Take 1 tablet by mouth daily.           Marland Kitchen omeprazole (PRILOSEC) 20 MG capsule   Oral   Take 20 mg by mouth 2 (two) times daily before a meal.         . phenazopyridine (PYRIDIUM) 200 MG tablet   Oral   Take 1 tablet (200 mg total) by mouth 3 (three) times daily as needed for pain.   10 tablet   0   . potassium chloride SA (K-DUR,KLOR-CON) 20 MEQ tablet      take 1 tablet by mouth twice a day   60 tablet   1   . Probiotic Product (PRO-BIOTIC BLEND) CAPS   Oral   Take 1 capsule by mouth daily.         . prochlorperazine (COMPAZINE) 10 MG tablet   Oral   Take 10 mg by mouth 3 (three) times daily.      0   . topiramate (TOPAMAX) 25 MG tablet      TAKE 1-2 TABLETS BY MOUTH  DAILY   60 tablet   5   . traMADol (ULTRAM) 50 MG tablet   Oral   Take 1 tablet (50 mg total) by mouth every 6 (six) hours as needed.   60 tablet   1   . zolpidem (AMBIEN) 5 MG tablet   Oral   Take 1 tablet (5 mg total) by mouth at bedtime as needed for sleep. 1/2-1 tab   30 tablet   1      Allergies Aspirin; Codeine; Fosamax ; Ibuprofen; Ibuprofen; Metronidazole; Naproxen; Naproxen sodium; Nsaids; Oxycodone; and Oxycodone-acetaminophen   Family History  Problem Relation Age of Onset  . Ovarian cancer Maternal Grandmother 8  . Breast cancer Maternal Aunt 70    Social History Social History  Substance Use Topics  . Smoking status: Former Smoker    Quit date: 06/12/1989  . Smokeless tobacco: Never Used  . Alcohol Use: No    Review of Systems  Constitutional:   No fever or chills. No weight changes Eyes:   No blurry vision or double vision.  ENT:   No sore throat. Cardiovascular:   No chest pain. Respiratory:   Positive shortness of breath. Gastrointestinal:   Negative for abdominal pain, vomiting and diarrhea.  No BRBPR or melena. Genitourinary:   Negative for dysuria, urinary retention, bloody urine, or difficulty urinating. Musculoskeletal:   As a severe low back pain bilaterally. Patient indicates the area of the sacrum Skin:   Right leg edema and erythema, worsening than usual Neurological:   Negative for headaches, focal weakness or numbness. Psychiatric:  No anxiety or depression.   Endocrine:  No hot/cold intolerance, changes in energy, or sleep difficulty.  10-point ROS otherwise negative.  ____________________________________________   PHYSICAL EXAM:  VITAL SIGNS: ED Triage Vitals  Enc Vitals Group     BP 05/29/15 1103 79/45 mmHg     Pulse Rate 05/29/15 1103 115     Resp 05/29/15 1103 20     Temp 05/29/15 1103 97.7 F (36.5 C)     Temp Source 05/29/15 1103 Oral     SpO2 05/29/15 1103 100 %     Weight 05/29/15 1103 124 lb (56.246 kg)      Height --      Head Cir --  Peak Flow --      Pain Score 05/29/15 1103 10     Pain Loc --      Pain Edu? --      Excl. in [place]? --      Constitutional:   Alert and oriented. Ill-appearing. Eyes:   No scleral icterus. No conjunctival pallor. PERRL. EOMI ENT   Head:   Normocephalic and atraumatic.   Nose:   No congestion/rhinnorhea. No septal hematoma   Mouth/Throat:   Dry mucous membranes, no pharyngeal erythema. No peritonsillar mass. No uvula shift.   Neck:   No stridor. No SubQ emphysema. No meningismus. Hematological/Lymphatic/Immunilogical:   No cervical lymphadenopathy. Cardiovascular:   Tachycardic heart rate 115. Normal and symmetric distal pulses are present in all extremities. No murmurs, rubs, or gallops. Respiratory:   Normal respiratory effort without tachypnea nor retractions. Breath sounds are clear and equal bilaterally. No wheezes/rales/rhonchi. Gastrointestinal:   Soft and nontender. No distention. There is no CVA tenderness.  No rebound, rigidity, or guarding. Genitourinary:   deferred Musculoskeletal:  Bilateral lower extremity edema with streaky erythema and warmth of the right lower extremity from the distal tibia area all the way up to the mid thigh. Neurologic:   Normal speech and language.  CN 2-10 normal. Motor grossly intact.  No gross focal neurologic deficits are appreciated.  Skin:    Skin is warm, dry and intact. No rash noted.  No petechiae, purpura, or bullae. Psychiatric:   Mood and affect are normal. Speech and behavior are normal. Patient exhibits appropriate insight and judgment.  ____________________________________________    LABS (pertinent positives/negatives) (all labs ordered are listed, but only abnormal results are displayed) Labs Reviewed  CBC WITH DIFFERENTIAL/PLATELET - Abnormal; Notable for the following:    RBC 3.26 (*)    Hemoglobin 11.7 (*)    HCT 33.9 (*)    MCV 103.9 (*)    MCH 36.0 (*)    RDW 15.1 (*)     Platelets 92 (*)    Lymphs Abs 0.3 (*)    Monocytes Absolute 0.1 (*)    All other components within normal limits  COMPREHENSIVE METABOLIC PANEL - Abnormal; Notable for the following:    Sodium 134 (*)    Potassium 3.4 (*)    BUN 23 (*)    Calcium 8.8 (*)    Total Protein 5.7 (*)    Albumin 3.0 (*)    Total Bilirubin 1.3 (*)    All other components within normal limits  URINALYSIS COMPLETEWITH MICROSCOPIC ([facility] ONLY) - Abnormal; Notable for the following:    Color, Urine YELLOW (*)    APPearance HAZY (*)    Leukocytes, UA TRACE (*)    Bacteria, UA RARE (*)    Squamous Epithelial / LPF 0-5 (*)    All other components within normal limits  LACTIC ACID, PLASMA - Abnormal; Notable for the following:    Lactic Acid, Venous 3.1 (*)    All other components within normal limits  CULTURE, BLOOD (ROUTINE X 2)  CULTURE, BLOOD (ROUTINE X 2)  URINE CULTURE  LIPASE, BLOOD  LACTIC ACID, PLASMA   ____________________________________________   EKG    ____________________________________________    RADIOLOGY  CT angiogram of the chest negative for PE, no apparent aortic injury. Clear lungs.  CT angiogram abdomen pelvis without vascular injury or any intra-abdominal findings. Diverticulosis without diverticulitis. Compression deformities of L2 and L4.  ____________________________________________   PROCEDURES CRITICAL CARE Performed by: Joni Fears, Kayton Dunaj   Total critical care  time: 35 minutes  Critical care time was exclusive of separately billable procedures and treating other patients.  Critical care was necessary to treat or prevent imminent or life-threatening deterioration.  Critical care was time spent personally by me on the following activities: development of treatment plan with patient and/or surrogate as well as nursing, discussions with consultants, evaluation of patient's response to treatment, examination of patient, obtaining history from patient or surrogate,  ordering and performing treatments and interventions, ordering and review of laboratory studies, ordering and review of radiographic studies, pulse oximetry and re-evaluation of patient's condition.   ____________________________________________   INITIAL IMPRESSION / ASSESSMENT AND PLAN / ED COURSE  Pertinent labs & imaging results that were available during my care of the patient were reviewed by me and considered in my medical decision making (see chart for details).  Patient presents with hypotension and tachycardia. No fever, no altered mental status, does not meet code sepsis criteria. We will be aggressive with IV fluids regardless while checking labs.. With her pain I'm most concerned about a pulmonary embolism versus AAA. We will get a CT angiogram of the chest abdomen and pelvis. There is some clinical uncertainty about the underlying etiology of the hypotension.  ----------------------------------------- 3:14 PM on 05/29/2015 -----------------------------------------  With close monitoring of her vitals, her tachycardia has improved slightly with 2 L of IV fluids, and her blood pressure has improved substantially is now essentially normalized. CTs were essentially unremarkable and do not reveal a cause for her vital sign abnormalities, but her lactate has resulted at 3.1.  With the lack of other findings and the skin findings on exam with the right leg, she most likely has sepsis related to a right lower extremity cellulitis. She has already received greater than 30 mL/kg bolus of IV fluids and we will continue with IV fluids given that she is still tachycardic to 105 or 110. I'll start IV vancomycin, and the case has been discussed with the hospitalist for admission.  She has good capillary refill, less than 2 seconds. Cardiopulmonary exam is unchanged with tachycardia heart rate 110, no murmurs, lungs clear to auscultation bilaterally without  rales.    ____________________________________________   FINAL CLINICAL IMPRESSION(S) / ED DIAGNOSES  Final diagnoses:  Cellulitis of right leg  Sepsis, due to unspecified organism [facility])      Carrie Mew, MD 05/29/15 [phone]

## 2015-05-29 NOTE — ED Notes (Signed)
Pt ambulated to bathroom with assist x 1, pt tolerated well, no acute distress noted.

## 2015-05-30 ENCOUNTER — Inpatient Hospital Stay: Payer: Medicare Other

## 2015-05-30 DIAGNOSIS — S32029S Unspecified fracture of second lumbar vertebra, sequela: Secondary | ICD-10-CM

## 2015-05-30 DIAGNOSIS — S32000A Wedge compression fracture of unspecified lumbar vertebra, initial encounter for closed fracture: Secondary | ICD-10-CM | POA: Insufficient documentation

## 2015-05-30 DIAGNOSIS — Z923 Personal history of irradiation: Secondary | ICD-10-CM

## 2015-05-30 DIAGNOSIS — C7931 Secondary malignant neoplasm of brain: Secondary | ICD-10-CM

## 2015-05-30 DIAGNOSIS — S32048S Other fracture of fourth lumbar vertebra, sequela: Secondary | ICD-10-CM

## 2015-05-30 DIAGNOSIS — L03115 Cellulitis of right lower limb: Secondary | ICD-10-CM

## 2015-05-30 DIAGNOSIS — Z86711 Personal history of pulmonary embolism: Secondary | ICD-10-CM

## 2015-05-30 DIAGNOSIS — Z79899 Other long term (current) drug therapy: Secondary | ICD-10-CM

## 2015-05-30 DIAGNOSIS — R7881 Bacteremia: Secondary | ICD-10-CM

## 2015-05-30 DIAGNOSIS — B952 Enterococcus as the cause of diseases classified elsewhere: Secondary | ICD-10-CM

## 2015-05-30 DIAGNOSIS — C569 Malignant neoplasm of unspecified ovary: Secondary | ICD-10-CM

## 2015-05-30 DIAGNOSIS — J449 Chronic obstructive pulmonary disease, unspecified: Secondary | ICD-10-CM

## 2015-05-30 DIAGNOSIS — Z7901 Long term (current) use of anticoagulants: Secondary | ICD-10-CM

## 2015-05-30 DIAGNOSIS — M810 Age-related osteoporosis without current pathological fracture: Secondary | ICD-10-CM

## 2015-05-30 LAB — BLOOD CULTURE ID PANEL (REFLEXED)
ACINETOBACTER BAUMANNII: NOT DETECTED
CANDIDA KRUSEI: NOT DETECTED
CANDIDA PARAPSILOSIS: NOT DETECTED
CANDIDA TROPICALIS: NOT DETECTED
CARBAPENEM RESISTANCE: NOT DETECTED
Candida albicans: NOT DETECTED
Candida glabrata: NOT DETECTED
ENTEROCOCCUS SPECIES: DETECTED — AB
ESCHERICHIA COLI: NOT DETECTED
Enterobacter cloacae complex: NOT DETECTED
Enterobacteriaceae species: NOT DETECTED
HAEMOPHILUS INFLUENZAE: NOT DETECTED
KLEBSIELLA OXYTOCA: NOT DETECTED
KLEBSIELLA PNEUMONIAE: NOT DETECTED
Listeria monocytogenes: NOT DETECTED
METHICILLIN RESISTANCE: NOT DETECTED
Neisseria meningitidis: NOT DETECTED
PROTEUS SPECIES: NOT DETECTED
Pseudomonas aeruginosa: NOT DETECTED
SERRATIA MARCESCENS: NOT DETECTED
STREPTOCOCCUS PYOGENES: NOT DETECTED
Staphylococcus aureus (BCID): NOT DETECTED
Staphylococcus species: NOT DETECTED
Streptococcus agalactiae: NOT DETECTED
Streptococcus pneumoniae: NOT DETECTED
Streptococcus species: NOT DETECTED
VANCOMYCIN RESISTANCE: NOT DETECTED

## 2015-05-30 LAB — CBC
HEMATOCRIT: 29.1 % — AB (ref 35.0–47.0)
HEMOGLOBIN: 10.2 g/dL — AB (ref 12.0–16.0)
MCH: 35.9 pg — AB (ref 26.0–34.0)
MCHC: 34.9 g/dL (ref 32.0–36.0)
MCV: 102.9 fL — AB (ref 80.0–100.0)
Platelets: 83 10*3/uL — ABNORMAL LOW (ref 150–440)
RBC: 2.83 MIL/uL — ABNORMAL LOW (ref 3.80–5.20)
RDW: 14.7 % — ABNORMAL HIGH (ref 11.5–14.5)
WBC: 4.9 10*3/uL (ref 3.6–11.0)

## 2015-05-30 LAB — BASIC METABOLIC PANEL
ANION GAP: 7 (ref 5–15)
BUN: 16 mg/dL (ref 6–20)
CALCIUM: 7.9 mg/dL — AB (ref 8.9–10.3)
CHLORIDE: 103 mmol/L (ref 101–111)
CO2: 24 mmol/L (ref 22–32)
Creatinine, Ser: 0.66 mg/dL (ref 0.44–1.00)
GFR calc non Af Amer: >60 mL/min (ref >60)
GLUCOSE: 105 mg/dL — AB (ref 65–99)
POTASSIUM: 3.2 mmol/L — AB (ref 3.5–5.1)
Sodium: 134 mmol/L — ABNORMAL LOW (ref 135–145)

## 2015-05-30 MED ORDER — GADOBENATE DIMEGLUMINE 529 MG/ML IV SOLN
15.0000 mL | Freq: Once | INTRAVENOUS | Status: AC | PRN
  Administered 2015-05-30: 11 mL via INTRAVENOUS

## 2015-05-30 MED ORDER — HEPARIN BOLUS VIA INFUSION
3500.0000 [IU] | Freq: Once | INTRAVENOUS | Status: AC
  Administered 2015-05-30: 3500 [IU] via INTRAVENOUS
  Filled 2015-05-30: qty 3500

## 2015-05-30 MED ORDER — NYSTATIN 100000 UNIT/ML MT SUSP
5.0000 mL | Freq: Four times a day (QID) | OROMUCOSAL | Status: DC
  Filled 2015-05-30 (×4): qty 5

## 2015-05-30 MED ORDER — HEPARIN (PORCINE) IN NACL 100-0.45 UNIT/ML-% IJ SOLN
1000.0000 [IU]/h | INTRAMUSCULAR | Status: DC
  Administered 2015-05-30: 1000 [IU]/h via INTRAVENOUS
  Filled 2015-05-30 (×2): qty 250

## 2015-05-30 NOTE — Progress Notes (Signed)
Back from MRI.

## 2015-05-30 NOTE — Progress Notes (Signed)
Patient arrived on unit late in shift  VSS C/o lower back pain x1 relieved well with prn morphine Got a nystatin order for thrush in mouth patient declined med saying swish and swallow type not effective shift nurse aware  Patient continues on Heparin drip  NPO after midnight for possible  Kypoplasty tommorow

## 2015-05-30 NOTE — Progress Notes (Signed)
[facility] [place] NAME: Amanda Robbins    MR#:  [id number]  DATE OF BIRTH:  December 04, 1940  SUBJECTIVE:  CHIEF COMPLAINT:   Chief Complaint  Patient presents with  . Back Pain   Patient here due to back pain and noted to have a compression fracture at L2-L3 level. She also here with sepsis secondary to a right lower extremity cellulitis. Husband at bedside. Afebrile overnight. Still having significant back pain and poor mobility.  REVIEW OF SYSTEMS:    Review of Systems  Constitutional: Positive for malaise/fatigue. Negative for fever and chills.  HENT: Negative for congestion and tinnitus.   Eyes: Negative for blurred vision and double vision.  Respiratory: Negative for cough, shortness of breath and wheezing.   Cardiovascular: Negative for chest pain, orthopnea and PND.  Gastrointestinal: Negative for nausea, vomiting, abdominal pain and diarrhea.  Genitourinary: Negative for dysuria and hematuria.  Musculoskeletal: Positive for back pain.  Neurological: Positive for weakness. Negative for dizziness, sensory change and focal weakness.  All other systems reviewed and are negative.   Nutrition: Regular Tolerating Diet: Yes Tolerating PT: Await evaluation   DRUG ALLERGIES:   Allergies  Allergen Reactions  . Aspirin     Other reaction(s): Localized superficial swelling of skin uncoated asa-causes throat to swell up  . Codeine Itching    Other reaction(s): Itching of Skin  . Fosamax  [Alendronate Sodium]     Other reaction(s): Distress (finding)  . Ibuprofen   . Ibuprofen     Other reaction(s): Distress (finding)  . Metronidazole Nausea And Vomiting  . Naproxen     Other reaction(s): Distress (finding)  . Naproxen Sodium   . Nsaids     GI UPSET / GERD  . Oxycodone     Other reaction(s): Diarrhea and vomiting (finding)  . Oxycodone-Acetaminophen     Other reaction(s): Diarrhea and vomiting (finding)    VITALS:   Blood pressure 106/54, pulse 106, temperature 98.6 F (37 C), temperature source Oral, resp. rate 20, height 5' 6"  (1.676 m), weight 58.877 kg (129 lb 12.8 oz), SpO2 98 %.  PHYSICAL EXAMINATION:   Physical Exam  GENERAL:  74 y.o.-year-old patient lying in the bed with no acute distress.  EYES: Pupils equal, round, reactive to light and accommodation. No scleral icterus. Extraocular muscles intact.  HEENT: Head atraumatic, normocephalic. Oropharynx and nasopharynx clear.  NECK:  Supple, no jugular venous distention. No thyroid enlargement, no tenderness.  LUNGS: Normal breath sounds bilaterally, no wheezing, rales, rhonchi. No use of accessory muscles of respiration.  CARDIOVASCULAR: S1, S2 normal. No murmurs, rubs, or gallops.  ABDOMEN: Soft, nontender, nondistended. Bowel sounds present. No organomegaly or mass.  EXTREMITIES: No cyanosis, clubbing or edema b/l.    NEUROLOGIC: Cranial nerves II through XII are intact. No focal Motor or sensory deficits b/l.   PSYCHIATRIC: The patient is alert and oriented x 3.  SKIN: No obvious rash, lesion, or ulcer.    LABORATORY PANEL:   CBC  Recent Labs Lab 05/30/15 0430  WBC 4.9  HGB 10.2*  HCT 29.1*  PLT 83*   ------------------------------------------------------------------------------------------------------------------  Chemistries   Recent Labs Lab 05/29/15 1149 05/30/15 0430  NA 134* 134*  K 3.4* 3.2*  CL 103 103  CO2 26 24  GLUCOSE 95 105*  BUN 23* 16  CREATININE 0.91 0.66  CALCIUM 8.8* 7.9*  AST 33  --   ALT 32  --   ALKPHOS 65  --  BILITOT 1.3*  --    ------------------------------------------------------------------------------------------------------------------  Cardiac Enzymes No results for input(s): TROPONINI in the last 168 hours. ------------------------------------------------------------------------------------------------------------------  RADIOLOGY:  Ct Angio Chest Pe W/cm &/or Wo  Cm  05/29/2015  CLINICAL DATA:  Back pain for 2 weeks EXAM: CT ANGIOGRAPHY CHEST, ABDOMEN AND PELVIS TECHNIQUE: Multidetector CT imaging through the chest, abdomen and pelvis was performed using the standard protocol during bolus administration of intravenous contrast. Multiplanar reconstructed images and MIPs were obtained and reviewed to evaluate the vascular anatomy. CONTRAST:  54m OMNIPAQUE IOHEXOL 350 MG/ML SOLN COMPARISON:  03/03/2015. FINDINGS: CTA CHEST FINDINGS Lungs are well aerated bilaterally. Biapical subpleural and parenchymal scarring is noted. No sizable infiltrate is seen. Mild scarring is noted inferiorly in the right lung as well. The thoracic inlet is within normal limits. The thoracic aorta and its branches show mild atherosclerotic change. No aneurysmal dilatation or dissection is identified. The pulmonary artery as visualized is within normal limits. No significant hilar or mediastinal adenopathy is noted. No acute bony abnormality is noted. Review of the MIP images confirms the above findings. CTA ABDOMEN AND PELVIS FINDINGS The gallbladder has been surgically removed. The liver is diffusely decreased in attenuation consistent with fatty infiltration. The spleen, adrenal glands and pancreas are within normal limits. Kidneys are well visualized without evidence renal calculi or urinary tract obstructive change. A left retro aortic renal vein is noted. The urinary bladder is within normal limits. Mild diverticular change is noted without evidence of diverticulitis. The appendix is not well seen. No inflammatory changes are noted. The uterus has been surgically removed. The abdominal aorta shows a normal branching pattern. Mild aortic calcifications are seen. No aneurysm or dissection is identified. The osseous structures show compression deformity of the superior endplate of L2 and inferior endplate of L4. These changes are new from the prior exam. Review of the MIP images confirms the above  findings. IMPRESSION: No evidence of dissection or aneurysmal dilatation. No evidence of pulmonary embolism. L2 and L4 compression deformities as described which are new from September of 2016. They may be related to the patient's recent injury. Electronically Signed   By: MInez CatalinaM.D.   On: 05/29/2015 14:22   Mr Lumbar Spine W Wo Contrast  05/30/2015  CLINICAL DATA:  Low back pain following twisting injury approximately 2 weeks ago. New fractures at L2 and L4 on recent PET-CT. History of metastatic ovarian cancer. EXAM: MRI LUMBAR SPINE WITHOUT AND WITH CONTRAST TECHNIQUE: Multiplanar and multiecho pulse sequences of the lumbar spine were obtained without and with intravenous contrast. CONTRAST:  110mMULTIHANCE GADOBENATE DIMEGLUMINE 529 MG/ML IV SOLN COMPARISON:  PET-CT 03/03/2015. Abdominal pelvic CT 05/25/2014 and 05/29/2015. FINDINGS: Segmentation: Conventional anatomy assumed, with the last open disc space designated L5-S1. Alignment:  Normal. Bones: As demonstrated on recent CT, there are fractures at L2 and L4 which are new from the PET-CT of September. At L2, there is a superior endplate compression fracture resulting in approximately 20% loss vertebral body height and 6 mm of osseous retropulsion. At L4, there is an inferior endplate compression deformity resulting in less than 20% loss of vertebral body height centrally and mild osseous retropulsion. Both fractures demonstrate mild adjacent marrow edema and enhancement. Neither fracture demonstrates any pathologic features or associated epidural hematoma. There are no osseous findings worrisome for metastatic disease. There is a 5 mm lesion in the left L4 pedicle which demonstrates increased T1 and T2 signal, probably a small hemangioma. This is not clearly seen on  older or recent CTs. Conus medullaris: Extends to the L1-2 level and appears normal. No abnormal intradural enhancement. Paraspinal and other soft tissues: There is a small amount of  paraspinal edema adjacent to both fractures. The visualized retroperitoneum appears grossly stable with small lymph nodes. There is a possible small right-sided retrocrural lymphocele, measuring 11 mm on axial image 3 of series 5. Disc levels: L1-2: The disc appears normal. There is mild osseous retropulsion related to the L2 fracture. No foraminal compromise or nerve root encroachment. L2-3: Mild disc desiccation with preserved height. Mild bilateral facet hypertrophy. No spinal stenosis or nerve root encroachment. L3-4: Mild disc desiccation with preserved height. Mild bilateral facet hypertrophy. No significant spinal stenosis or nerve root encroachment. L4-5: Disc bulging, facet and ligamentous hypertrophy superimposed on short pedicles result in mild spinal stenosis. There is minimal osseous retropulsion related to the L4 fracture. No evidence of nerve root encroachment. L5-S1: Chronic degenerative disc disease with loss of disc height, paraspinal osteophytes and vacuum phenomenon. No significant spinal stenosis or nerve root encroachment. IMPRESSION: 1. The recently demonstrated fractures at L2 and L4 are unchanged from CT of yesterday. These are consistent with benign/posttraumatic fractures and appear acute/subacute in age. There is mild associated osseous retropulsion and paraspinal hemorrhage. 2. No evidence of metastatic disease or pathologic fracture. 3. Mild spondylosis, greatest at L5-S1. No evidence of nerve root encroachment Electronically Signed   By: Richardean Sale M.D.   On: 05/30/2015 13:06   Ct Cta Abd/pel W/cm &/or W/o Cm  05/29/2015  CLINICAL DATA:  Back pain for 2 weeks EXAM: CT ANGIOGRAPHY CHEST, ABDOMEN AND PELVIS TECHNIQUE: Multidetector CT imaging through the chest, abdomen and pelvis was performed using the standard protocol during bolus administration of intravenous contrast. Multiplanar reconstructed images and MIPs were obtained and reviewed to evaluate the vascular anatomy.  CONTRAST:  27m OMNIPAQUE IOHEXOL 350 MG/ML SOLN COMPARISON:  03/03/2015. FINDINGS: CTA CHEST FINDINGS Lungs are well aerated bilaterally. Biapical subpleural and parenchymal scarring is noted. No sizable infiltrate is seen. Mild scarring is noted inferiorly in the right lung as well. The thoracic inlet is within normal limits. The thoracic aorta and its branches show mild atherosclerotic change. No aneurysmal dilatation or dissection is identified. The pulmonary artery as visualized is within normal limits. No significant hilar or mediastinal adenopathy is noted. No acute bony abnormality is noted. Review of the MIP images confirms the above findings. CTA ABDOMEN AND PELVIS FINDINGS The gallbladder has been surgically removed. The liver is diffusely decreased in attenuation consistent with fatty infiltration. The spleen, adrenal glands and pancreas are within normal limits. Kidneys are well visualized without evidence renal calculi or urinary tract obstructive change. A left retro aortic renal vein is noted. The urinary bladder is within normal limits. Mild diverticular change is noted without evidence of diverticulitis. The appendix is not well seen. No inflammatory changes are noted. The uterus has been surgically removed. The abdominal aorta shows a normal branching pattern. Mild aortic calcifications are seen. No aneurysm or dissection is identified. The osseous structures show compression deformity of the superior endplate of L2 and inferior endplate of L4. These changes are new from the prior exam. Review of the MIP images confirms the above findings. IMPRESSION: No evidence of dissection or aneurysmal dilatation. No evidence of pulmonary embolism. L2 and L4 compression deformities as described which are new from September of 2016. They may be related to the patient's recent injury. Electronically Signed   By: MLinus MakoD.  On: 05/29/2015 14:22     ASSESSMENT AND PLAN:   74 year old female with  past medical history of metastatic ovarian cancer, chronic anemia/thrombocytopenia, GERD, COPD, history of SVT, history of mitral valve prolapse, who presented to the hospital due to back pain and also noted to have sepsis secondary cellulitis.  #1 back pain-this is secondary to a compression fracture in the L2-L4 area. -Patient apparently tried to lift something a few weeks back and developed acute back pain. -Appreciate orthopedics input and will discuss with Dr. Grayland Ormond tomorrow about possibly doing a kyphoplasty. 2 - cont. supportive care with pain control for now.  #2 sepsis-patient met criteria on admission given her hypotension/tachycardia and elevated lactic acid. -Continue IV vancomycin for now. Patient's blood cultures are positive for enterococcus with sensitivities pending.  #3 right lower extremity cellulitis-this was the patient's sepsis. -Continue IV vancomycin, follow blood cultures. Afebrile, hemodynamically stable presently.  #4 history of PE-patient is on adequate is which has been held. Patient has been started on heparin drip in case she needs kyphoplasty tomorrow.  #5 chronic anemia/thrombus cytopenia-due to underlying malignancy and patient getting chemotherapy. -Continue to follow counts. No acute need for transfusion presently.  #6 Ovarian Cancer w/ mets - await oncology consult. Prognosis is poor.  - cont. Decadron.    #7 GERD - cont. Protonix.      All the records are reviewed and case discussed with Care Management/Social Workerr. Management plans discussed with the patient, family and they are in agreement.  CODE STATUS: Full  DVT Prophylaxis: Heparin drip  TOTAL TIME TAKING CARE OF THIS PATIENT: 35 minutes.   POSSIBLE D/C IN 2-3 DAYS, DEPENDING ON CLINICAL CONDITION.   Henreitta Leber M.D on 05/30/2015 at 2:44 PM  Between 7am to 6pm - Pager - [phone]  After 6pm go to [email or web address] - password [email or web address] [facility]  Office   [phone]  CC: Primary care physician; Rica Mast, MD

## 2015-05-30 NOTE — Progress Notes (Signed)
To MRI via bed

## 2015-05-30 NOTE — Progress Notes (Signed)
Report called to Chris, RN on 1C.   

## 2015-05-30 NOTE — Progress Notes (Signed)
Pharmacy Antibiotic Follow-up Note  Amanda Robbins is a 74 y.o. year-old female admitted on 05/29/2015.  The patient is currently on day two of vancomycin for cellulitis/sepsis.  Assessment/Plan: This patient's current antibiotics will be continued without adjustments. Discussed with Dr. Verdell Carmine about narrowing to ampicillin if just treating enterococcus in the blood but keeping vancomycin for now to cover for cellulitis as well.   Temp (24hrs), Avg:98.2 F (36.8 C), Min:97.7 F (36.5 C), Max:98.8 F (37.1 C)   Recent Labs Lab 05/25/15 0917 05/29/15 1149 05/30/15 0430  WBC 4.2 3.9 4.9    Recent Labs Lab 05/25/15 0917 05/29/15 1149 05/30/15 0430  CREATININE 0.78 0.91 0.66   Estimated Creatinine Clearance: 59.1 mL/min (by C-G formula based on Cr of 0.66).    Allergies  Allergen Reactions  . Aspirin     Other reaction(s): Localized superficial swelling of skin uncoated asa-causes throat to swell up  . Codeine Itching    Other reaction(s): Itching of Skin  . Fosamax  [Alendronate Sodium]     Other reaction(s): Distress (finding)  . Ibuprofen   . Ibuprofen     Other reaction(s): Distress (finding)  . Metronidazole Nausea And Vomiting  . Naproxen     Other reaction(s): Distress (finding)  . Naproxen Sodium   . Nsaids     GI UPSET / GERD  . Oxycodone     Other reaction(s): Diarrhea and vomiting (finding)  . Oxycodone-Acetaminophen     Other reaction(s): Diarrhea and vomiting (finding)    Antimicrobials this admission: Vancomycin 12/17 >>      Microbiology results: 12/17 BCx: 2/2 GPC 12/17 UCx: sent    Thank you for allowing pharmacy to be a part of this patient's care.  Rocky Morel PharmD 05/30/2015 7:53 AM

## 2015-05-30 NOTE — Progress Notes (Signed)
[facility]  Date of admission:  05/29/2015  Inpatient day:  05/30/2015  Consulting physician: Azucena Freed, MD   Reason for Consultation:  Ovarian cancer with brain mets undergoing chemotherapy and RT, admitted with sepsis, cellulitis  Chief Complaint: Amanda Robbins is a 74 y.o. female with metastatic ovarian cancer who was admitted through the emergency room with back pain secondary to compression fractures of L2 and L4, and cellulitis of the right lower extremity.  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  She has been receiving weekly Taxotere (last 05/25/2015).  Approximately 3 weeks ago, she lifted a heavy drawer and immediately felt back pain.  She was scheduled for an outpatient MRI.  Yesterday, her pain became so severe that she presented to the emergency room.  ER evaluation revealed hypotension (79/45) and tachycardia (115).  Exam revealed bilateral lower extremity swelling and right lower extremity redness.  The patient notes bilateral lower extremity edema for some time, but only a 1 day history of right lower extremity erythema with increased tenderness.  She denied any fever.  Abdomen and pelvic CT scan on 05/29/2015 revealed L2 and L4 compression deformities new from 02/2015.  Chest CT angiogram revealed no evidence of pulmonary embolism.  Lumbar spine MRI on 05/30/2015 revealed fractures at L2 and L4 consistent with benign/posttraumatic fractures which appeared acute/subacute in age. There was mild associated osseous retropulsion and paraspinal hemorrhage.  There was no evidence of metastatic disease or pathologic fracture.  The patient states that she has osteoporosis and receives Prolia every 6 months.  Additional labs notable for a lactic acid of 3.4.  Blood cultures were positive for gram positive cocci (enterococcus).  She is on vancomycin.  Her Eliquis has been on hold and heparin instituted  for possible kyphoplasty.  Past Medical History  Diagnosis Date  . 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 Irritable bowel   . C. difficile colitis 2011  . Allergic rhinitis     Followed by Dr. Donneta Romberg  . Esophageal reflux     Followed by GI physician, on Dexilant and Ranitidine  . Migraine   . 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 Osteoporosis 2009    on Reclast once yearly, Followed by Dr. Jefm Bryant  . Ovarian cancer ([place])   . COPD (chronic obstructive pulmonary disease) ([place])   . Thrombocytopenia ([place])   . MVP (mitral valve prolapse)   . SVT (supraventricular tachycardia) [facility])     Past Surgical History  Procedure Laterality Date  . Bil roken wrists    . Gallbladder removed    . Bilateral bunionectomies    . Cesarean section    . Abdominal hysterectomy    . Cholecystectomy      Family History  Problem Relation Age of Onset  . Ovarian cancer Maternal Grandmother 75  . Breast cancer Maternal Aunt 70    Social History:  reports that she quit smoking about 25 years ago. She has never used smokeless tobacco. She reports that she does not drink alcohol or use illicit drugs.  The patient is accompanied by her daughter and sister.  Allergies:  Allergies  Allergen Reactions  . Aspirin     Other reaction(s): Localized superficial swelling of skin uncoated asa-causes throat to swell up  . Codeine Itching    Other reaction(s): Itching of Skin  . Fosamax  [Alendronate Sodium]  Other reaction(s): Distress (finding)  . Ibuprofen   . Ibuprofen     Other reaction(s): Distress (finding)  . Metronidazole Nausea And Vomiting  . Naproxen     Other reaction(s): Distress (finding)  . Naproxen Sodium   . Nsaids     GI UPSET / GERD  . Oxycodone     Other reaction(s): Diarrhea and vomiting (finding)  . Oxycodone-Acetaminophen     Other reaction(s): Diarrhea and vomiting (finding)    Medications Prior to Admission  Medication Sig  Dispense Refill  . Acetaminophen 500 MG coapsule Take 1,000 mg by mouth every 6 (six) hours as needed for fever.    . Calcium Carbonate (CALCARB 600 PO) Take 600 mg by mouth 2 (two) times daily.    Marland Kitchen dexamethasone (DECADRON) 4 MG tablet Take 1 tablet (4 mg total) by mouth daily. 60 tablet 2  . DimenhyDRINATE (DRAMAMINE PO) Take 1 tablet by mouth daily as needed (motion sickness.).     Marland Kitchen ELIQUIS 5 MG TABS tablet take 1 tablet by mouth twice a day 60 tablet 0  . furosemide (LASIX) 20 MG tablet take 1 tablet by mouth once daily 30 tablet 5  . Multiple Vitamin (MULTIVITAMIN) tablet Take 1 tablet by mouth daily.      . naproxen sodium (ALEVE) 220 MG tablet Take 220 mg by mouth 2 (two) times daily as needed (for pain.).    Marland Kitchen omeprazole (PRILOSEC) 20 MG capsule Take 20 mg by mouth 2 (two) times daily before a meal.    . potassium chloride SA (K-DUR,KLOR-CON) 20 MEQ tablet take 1 tablet by mouth twice a day 60 tablet 1  . Probiotic Product (PRO-BIOTIC BLEND) CAPS Take 1 capsule by mouth daily.    Marland Kitchen topiramate (TOPAMAX) 25 MG tablet TAKE 1-2 TABLETS BY MOUTH DAILY (Patient taking differently: TAKE 1 TABLETS BY MOUTH DAILY) 60 tablet 5  . traMADol (ULTRAM) 50 MG tablet Take 1 tablet (50 mg total) by mouth every 6 (six) hours as needed. (Patient taking differently: Take 50 mg by mouth every 6 (six) hours as needed for moderate pain or severe pain. ) 60 tablet 1  . zolpidem (AMBIEN) 5 MG tablet Take 1 tablet (5 mg total) by mouth at bedtime as needed for sleep. 1/2-1 tab 30 tablet 1  . Diphenhyd-Hydrocort-Nystatin (FIRST-DUKES MOUTHWASH) SUSP Use as directed 5 mLs in the mouth or throat 4 (four) times daily as needed. 1 Bottle 1  . phenazopyridine (PYRIDIUM) 200 MG tablet Take 1 tablet (200 mg total) by mouth 3 (three) times daily as needed for pain. 10 tablet 0    Review of Systems: GENERAL:  Feels tired.  No fevers or sweats.  Weight loss with chemotherapy. PERFORMANCE STATUS (ECOG):  1-2 HEENT:  Change  in taste sensation.  No visual changes, runny nose, sore throat, mouth sores or tenderness. Lungs: No shortness of breath or cough.  No hemoptysis. Cardiac:  No chest pain, palpitations, orthopnea, or PND. GI:  Appetite modest.  No nausea, vomiting, diarrhea, constipation, melena or hematochezia. GU:  No urgency, frequency, dysuria, or hematuria. Musculoskeletal:  Low back pain x 2-3 weeks.  No joint pain.  No muscle tenderness. Extremities:  Bilateral lower extremity swelling. Skin:  Right lower extremity erythema x 1 day. Neuro:  No headache, numbness or weakness, balance or coordination issues. Endocrine:  No diabetes, thyroid issues, hot flashes or night sweats. Psych:  No mood changes, depression or anxiety. Pain:  Back pain. Review of systems:  All other systems  reviewed and found to be negative.  Physical Exam:  Blood pressure 106/54, pulse 106, temperature 98.6 F (37 C), temperature source Oral, resp. rate 20, height '5\' 6"'$  (1.676 m), weight 129 lb 12.8 oz (58.877 kg), SpO2 98 %.  GENERAL:  Fatigued woman lying comfortably on the medical unit in no acute distress. MENTAL STATUS:  Alert and oriented to person, place and time. HEAD:  Wearing a croquet hat.  Normocephalic, atraumatic, face symmetric, no Cushingoid features. EYES:  Blue eyes.  Pupils equal round and reactive to light and accomodation.  No conjunctivitis or scleral icterus. ENT:  Thrush.  Tongue normal. Mucous membranes moist.  RESPIRATORY:  Clear to auscultation without rales, wheezes or rhonchi. CARDIOVASCULAR:  Regular rate and rhythm without murmur, rub or gallop. CHEST:  Right sided port-a-cath, unremarkable. ABDOMEN:  Soft, non-tender, with active bowel sounds, and no hepatosplenomegaly.  No masses. SKIN:  Right lower extremity erythema with slight increased warmth and tenderness. EXTREMITIES: Bilateral 2-3 + pitting lower extremity edema.  No palpable cords. LYMPH NODES: No palpable cervical, supraclavicular,  axillary or inguinal adenopathy  NEUROLOGICAL: Unremarkable. PSYCH:  Appropriate.  Results for orders placed or performed during the hospital encounter of 05/29/15 (from the past 48 hour(s))  CBC with Differential     Status: Abnormal   Collection Time: 05/29/15 11:49 AM  Result Value Ref Range   WBC 3.9 3.6 - 11.0 K/uL   RBC 3.26 (L) 3.80 - 5.20 MIL/uL   Hemoglobin 11.7 (L) 12.0 - 16.0 g/dL   HCT 33.9 (L) 35.0 - 47.0 %   MCV 103.9 (H) 80.0 - 100.0 fL   MCH 36.0 (H) 26.0 - 34.0 pg   MCHC 34.6 32.0 - 36.0 g/dL   RDW 15.1 (H) 11.5 - 14.5 %   Platelets 92 (L) 150 - 440 K/uL   Neutrophils Relative % 90% %   Neutro Abs 3.5 1.4 - 6.5 K/uL   Lymphocytes Relative 7% %   Lymphs Abs 0.3 (L) 1.0 - 3.6 K/uL   Monocytes Relative 3% %   Monocytes Absolute 0.1 (L) 0.2 - 0.9 K/uL   Eosinophils Relative 0% %   Eosinophils Absolute 0.0 0 - 0.7 K/uL   Basophils Relative 0% %   Basophils Absolute 0.0 0 - 0.1 K/uL  Comprehensive metabolic panel     Status: Abnormal   Collection Time: 05/29/15 11:49 AM  Result Value Ref Range   Sodium 134 (L) 135 - 145 mmol/L   Potassium 3.4 (L) 3.5 - 5.1 mmol/L   Chloride 103 101 - 111 mmol/L   CO2 26 22 - 32 mmol/L   Glucose, Bld 95 65 - 99 mg/dL   BUN 23 (H) 6 - 20 mg/dL   Creatinine, Ser 0.91 0.44 - 1.00 mg/dL   Calcium 8.8 (L) 8.9 - 10.3 mg/dL   Total Protein 5.7 (L) 6.5 - 8.1 g/dL   Albumin 3.0 (L) 3.5 - 5.0 g/dL   AST 33 15 - 41 U/L   ALT 32 14 - 54 U/L   Alkaline Phosphatase 65 38 - 126 U/L   Total Bilirubin 1.3 (H) 0.3 - 1.2 mg/dL   GFR calc non Af Amer >60 >60 mL/min   GFR calc Af Amer >60 >60 mL/min    Comment: (NOTE) The eGFR has been calculated using the CKD EPI equation. This calculation has not been validated in all clinical situations. eGFR's persistently <60 mL/min signify possible Chronic Kidney Disease.    Anion gap 5 5 - 15  Lipase,  blood     Status: None   Collection Time: 05/29/15 11:49 AM  Result Value Ref Range   Lipase 15 11  - 51 U/L  Urinalysis complete, with microscopic ([facility] only)     Status: Abnormal   Collection Time: 05/29/15 11:49 AM  Result Value Ref Range   Color, Urine YELLOW (A) YELLOW   APPearance HAZY (A) CLEAR   Glucose, UA NEGATIVE NEGATIVE mg/dL   Bilirubin Urine NEGATIVE NEGATIVE   Ketones, ur NEGATIVE NEGATIVE mg/dL   Specific Gravity, Urine 1.010 1.005 - 1.030   Hgb urine dipstick NEGATIVE NEGATIVE   pH 7.0 5.0 - 8.0   Protein, ur NEGATIVE NEGATIVE mg/dL   Nitrite NEGATIVE NEGATIVE   Leukocytes, UA TRACE (A) NEGATIVE   RBC / HPF 0-5 0 - 5 RBC/hpf   WBC, UA 6-30 0 - 5 WBC/hpf   Bacteria, UA RARE (A) NONE SEEN   Squamous Epithelial / LPF 0-5 (A) NONE SEEN   Mucous PRESENT    Hyaline Casts, UA PRESENT   Urine culture     Status: None (Preliminary result)   Collection Time: 05/29/15 11:49 AM  Result Value Ref Range   Specimen Description URINE, RANDOM    Special Requests NONE    Culture HOLDING FOR POSSIBLE PATHOGEN    Report Status PENDING   Lactic acid, plasma     Status: Abnormal   Collection Time: 05/29/15  1:13 PM  Result Value Ref Range   Lactic Acid, Venous 3.1 (HH) 0.5 - 2.0 mmol/L    Comment: CRITICAL RESULT CALLED TO, READ BACK BY AND VERIFIED WITH LAURYN CLOUDEN @ 1432 ON 05/29/2015 BY CAF   Lactic acid, plasma     Status: None   Collection Time: 05/29/15  3:20 PM  Result Value Ref Range   Lactic Acid, Venous 1.3 0.5 - 2.0 mmol/L  Culture, blood (routine x 2)     Status: None (Preliminary result)   Collection Time: 05/29/15  3:20 PM  Result Value Ref Range   Specimen Description BLOOD LEFT ASSIST CONTROL    Special Requests BOTTLES DRAWN AEROBIC AND ANAEROBIC 1 CC    Culture  Setup Time      GRAM POSITIVE COCCI IN BOTH AEROBIC AND ANAEROBIC BOTTLES CRITICAL RESULT CALLED TO, READ BACK BY AND VERIFIED WITH: Teressa Senter 05/30/2015 0733 BY JRS.    Culture      ENTEROCOCCUS SPECIES IN BOTH AEROBIC AND ANAEROBIC BOTTLES SUSCEPTIBILITIES TO FOLLOW    Report Status  PENDING   Blood Culture ID Panel (Reflexed)     Status: Abnormal   Collection Time: 05/29/15  3:20 PM  Result Value Ref Range   Enterococcus species DETECTED (A) NOT DETECTED    Comment: CRITICAL RESULTS CALLED AND VERIFIED TO HANNA WANG 05/30/2015 0733 BY J [email or web address].   Listeria monocytogenes NOT DETECTED NOT DETECTED   Staphylococcus species NOT DETECTED NOT DETECTED   Staphylococcus aureus NOT DETECTED NOT DETECTED   Streptococcus species NOT DETECTED NOT DETECTED   Streptococcus agalactiae NOT DETECTED NOT DETECTED   Streptococcus pneumoniae NOT DETECTED NOT DETECTED   Streptococcus pyogenes NOT DETECTED NOT DETECTED   Acinetobacter baumannii NOT DETECTED NOT DETECTED   Enterobacteriaceae species NOT DETECTED NOT DETECTED   Enterobacter cloacae complex NOT DETECTED NOT DETECTED   Escherichia coli NOT DETECTED NOT DETECTED   Klebsiella oxytoca NOT DETECTED NOT DETECTED   Klebsiella pneumoniae NOT DETECTED NOT DETECTED   Proteus species NOT DETECTED NOT DETECTED   Serratia marcescens NOT DETECTED NOT DETECTED  Haemophilus influenzae NOT DETECTED NOT DETECTED   Neisseria meningitidis NOT DETECTED NOT DETECTED   Pseudomonas aeruginosa NOT DETECTED NOT DETECTED   Candida albicans NOT DETECTED NOT DETECTED   Candida glabrata NOT DETECTED NOT DETECTED   Candida krusei NOT DETECTED NOT DETECTED   Candida parapsilosis NOT DETECTED NOT DETECTED   Candida tropicalis NOT DETECTED NOT DETECTED   Carbapenem resistance NOT DETECTED NOT DETECTED   Methicillin resistance NOT DETECTED NOT DETECTED   Vancomycin resistance NOT DETECTED NOT DETECTED  Culture, blood (routine x 2)     Status: None (Preliminary result)   Collection Time: 05/29/15  3:21 PM  Result Value Ref Range   Specimen Description BLOOD RIGHT ASSIST CONTROL    Special Requests BOTTLES DRAWN AEROBIC AND ANAEROBIC 1 CC    Culture  Setup Time      GRAM POSITIVE COCCI IN BOTH AEROBIC AND ANAEROBIC BOTTLES CRITICAL VALUE  NOTED.  VALUE IS CONSISTENT WITH PREVIOUSLY REPORTED AND CALLED VALUE.    Culture GRAM POSITIVE COCCI    Report Status PENDING   Lactic acid, plasma     Status: Abnormal   Collection Time: 05/29/15  6:29 PM  Result Value Ref Range   Lactic Acid, Venous 3.4 (HH) 0.5 - 2.0 mmol/L    Comment: CRITICAL RESULT CALLED TO, READ BACK BY AND VERIFIED WITH [facility] WHITE AT 1928 05/29/15.PMH  Lactic acid, plasma     Status: None   Collection Time: 05/29/15 10:30 PM  Result Value Ref Range   Lactic Acid, Venous 1.3 0.5 - 2.0 mmol/L  Basic metabolic panel     Status: Abnormal   Collection Time: 05/30/15  4:30 AM  Result Value Ref Range   Sodium 134 (L) 135 - 145 mmol/L   Potassium 3.2 (L) 3.5 - 5.1 mmol/L   Chloride 103 101 - 111 mmol/L   CO2 24 22 - 32 mmol/L   Glucose, Bld 105 (H) 65 - 99 mg/dL   BUN 16 6 - 20 mg/dL   Creatinine, Ser 0.66 0.44 - 1.00 mg/dL   Calcium 7.9 (L) 8.9 - 10.3 mg/dL   GFR calc non Af Amer >60 >60 mL/min   GFR calc Af Amer >60 >60 mL/min    Comment: (NOTE) The eGFR has been calculated using the CKD EPI equation. This calculation has not been validated in all clinical situations. eGFR's persistently <60 mL/min signify possible Chronic Kidney Disease.    Anion gap 7 5 - 15  CBC     Status: Abnormal   Collection Time: 05/30/15  4:30 AM  Result Value Ref Range   WBC 4.9 3.6 - 11.0 K/uL   RBC 2.83 (L) 3.80 - 5.20 MIL/uL   Hemoglobin 10.2 (L) 12.0 - 16.0 g/dL   HCT 29.1 (L) 35.0 - 47.0 %   MCV 102.9 (H) 80.0 - 100.0 fL   MCH 35.9 (H) 26.0 - 34.0 pg   MCHC 34.9 32.0 - 36.0 g/dL   RDW 14.7 (H) 11.5 - 14.5 %   Platelets 83 (L) 150 - 440 K/uL   Ct Angio Chest Pe W/cm &/or Wo Cm  05/29/2015  CLINICAL DATA:  Back pain for 2 weeks EXAM: CT ANGIOGRAPHY CHEST, ABDOMEN AND PELVIS TECHNIQUE: Multidetector CT imaging through the chest, abdomen and pelvis was performed using the standard protocol during bolus administration of intravenous contrast. Multiplanar reconstructed  images and MIPs were obtained and reviewed to evaluate the vascular anatomy. CONTRAST:  81m OMNIPAQUE IOHEXOL 350 MG/ML SOLN COMPARISON:  03/03/2015. FINDINGS: CTA  CHEST FINDINGS Lungs are well aerated bilaterally. Biapical subpleural and parenchymal scarring is noted. No sizable infiltrate is seen. Mild scarring is noted inferiorly in the right lung as well. The thoracic inlet is within normal limits. The thoracic aorta and its branches show mild atherosclerotic change. No aneurysmal dilatation or dissection is identified. The pulmonary artery as visualized is within normal limits. No significant hilar or mediastinal adenopathy is noted. No acute bony abnormality is noted. Review of the MIP images confirms the above findings. CTA ABDOMEN AND PELVIS FINDINGS The gallbladder has been surgically removed. The liver is diffusely decreased in attenuation consistent with fatty infiltration. The spleen, adrenal glands and pancreas are within normal limits. Kidneys are well visualized without evidence renal calculi or urinary tract obstructive change. A left retro aortic renal vein is noted. The urinary bladder is within normal limits. Mild diverticular change is noted without evidence of diverticulitis. The appendix is not well seen. No inflammatory changes are noted. The uterus has been surgically removed. The abdominal aorta shows a normal branching pattern. Mild aortic calcifications are seen. No aneurysm or dissection is identified. The osseous structures show compression deformity of the superior endplate of L2 and inferior endplate of L4. These changes are new from the prior exam. Review of the MIP images confirms the above findings. IMPRESSION: No evidence of dissection or aneurysmal dilatation. No evidence of pulmonary embolism. L2 and L4 compression deformities as described which are new from September of 2016. They may be related to the patient's recent injury. Electronically Signed   By: Inez Catalina M.D.   On:  05/29/2015 14:22   Mr Lumbar Spine W Wo Contrast  05/30/2015  CLINICAL DATA:  Low back pain following twisting injury approximately 2 weeks ago. New fractures at L2 and L4 on recent PET-CT. History of metastatic ovarian cancer. EXAM: MRI LUMBAR SPINE WITHOUT AND WITH CONTRAST TECHNIQUE: Multiplanar and multiecho pulse sequences of the lumbar spine were obtained without and with intravenous contrast. CONTRAST:  67m MULTIHANCE GADOBENATE DIMEGLUMINE 529 MG/ML IV SOLN COMPARISON:  PET-CT 03/03/2015. Abdominal pelvic CT 05/25/2014 and 05/29/2015. FINDINGS: Segmentation: Conventional anatomy assumed, with the last open disc space designated L5-S1. Alignment:  Normal. Bones: As demonstrated on recent CT, there are fractures at L2 and L4 which are new from the PET-CT of September. At L2, there is a superior endplate compression fracture resulting in approximately 20% loss vertebral body height and 6 mm of osseous retropulsion. At L4, there is an inferior endplate compression deformity resulting in less than 20% loss of vertebral body height centrally and mild osseous retropulsion. Both fractures demonstrate mild adjacent marrow edema and enhancement. Neither fracture demonstrates any pathologic features or associated epidural hematoma. There are no osseous findings worrisome for metastatic disease. There is a 5 mm lesion in the left L4 pedicle which demonstrates increased T1 and T2 signal, probably a small hemangioma. This is not clearly seen on older or recent CTs. Conus medullaris: Extends to the L1-2 level and appears normal. No abnormal intradural enhancement. Paraspinal and other soft tissues: There is a small amount of paraspinal edema adjacent to both fractures. The visualized retroperitoneum appears grossly stable with small lymph nodes. There is a possible small right-sided retrocrural lymphocele, measuring 11 mm on axial image 3 of series 5. Disc levels: L1-2: The disc appears normal. There is mild osseous  retropulsion related to the L2 fracture. No foraminal compromise or nerve root encroachment. L2-3: Mild disc desiccation with preserved height. Mild bilateral facet hypertrophy. No spinal  stenosis or nerve root encroachment. L3-4: Mild disc desiccation with preserved height. Mild bilateral facet hypertrophy. No significant spinal stenosis or nerve root encroachment. L4-5: Disc bulging, facet and ligamentous hypertrophy superimposed on short pedicles result in mild spinal stenosis. There is minimal osseous retropulsion related to the L4 fracture. No evidence of nerve root encroachment. L5-S1: Chronic degenerative disc disease with loss of disc height, paraspinal osteophytes and vacuum phenomenon. No significant spinal stenosis or nerve root encroachment. IMPRESSION: 1. The recently demonstrated fractures at L2 and L4 are unchanged from CT of yesterday. These are consistent with benign/posttraumatic fractures and appear acute/subacute in age. There is mild associated osseous retropulsion and paraspinal hemorrhage. 2. No evidence of metastatic disease or pathologic fracture. 3. Mild spondylosis, greatest at L5-S1. No evidence of nerve root encroachment Electronically Signed   By: Richardean Sale M.D.   On: 05/30/2015 13:06   Ct Cta Abd/pel W/cm &/or W/o Cm  05/29/2015  CLINICAL DATA:  Back pain for 2 weeks EXAM: CT ANGIOGRAPHY CHEST, ABDOMEN AND PELVIS TECHNIQUE: Multidetector CT imaging through the chest, abdomen and pelvis was performed using the standard protocol during bolus administration of intravenous contrast. Multiplanar reconstructed images and MIPs were obtained and reviewed to evaluate the vascular anatomy. CONTRAST:  74m OMNIPAQUE IOHEXOL 350 MG/ML SOLN COMPARISON:  03/03/2015. FINDINGS: CTA CHEST FINDINGS Lungs are well aerated bilaterally. Biapical subpleural and parenchymal scarring is noted. No sizable infiltrate is seen. Mild scarring is noted inferiorly in the right lung as well. The thoracic  inlet is within normal limits. The thoracic aorta and its branches show mild atherosclerotic change. No aneurysmal dilatation or dissection is identified. The pulmonary artery as visualized is within normal limits. No significant hilar or mediastinal adenopathy is noted. No acute bony abnormality is noted. Review of the MIP images confirms the above findings. CTA ABDOMEN AND PELVIS FINDINGS The gallbladder has been surgically removed. The liver is diffusely decreased in attenuation consistent with fatty infiltration. The spleen, adrenal glands and pancreas are within normal limits. Kidneys are well visualized without evidence renal calculi or urinary tract obstructive change. A left retro aortic renal vein is noted. The urinary bladder is within normal limits. Mild diverticular change is noted without evidence of diverticulitis. The appendix is not well seen. No inflammatory changes are noted. The uterus has been surgically removed. The abdominal aorta shows a normal branching pattern. Mild aortic calcifications are seen. No aneurysm or dissection is identified. The osseous structures show compression deformity of the superior endplate of L2 and inferior endplate of L4. These changes are new from the prior exam. Review of the MIP images confirms the above findings. IMPRESSION: No evidence of dissection or aneurysmal dilatation. No evidence of pulmonary embolism. L2 and L4 compression deformities as described which are new from September of 2016. They may be related to the patient's recent injury. Electronically Signed   By: MInez CatalinaM.D.   On: 05/29/2015 14:22    Assessment:  The patient is a 74y.o.  woman with metastatic ovarian cancer and a history of CNS metastasis s/p radiation in 06/2014.  She has been receiving weekly Taxotere (last 05/25/2015).  She has a history of pulmonary embolism in 07/2014 and has been on Eliquis.  She has had back pain x 2-3 weeks after lifting a heavy drawer.  Lumbar spine  MRI on 05/30/2015 revealed fractures at L2 and L4 consistent with benign/posttraumatic fractures. There was mild associated osseous retropulsion and paraspinal hemorrhage.  There was no evidence of metastatic  disease or pathologic fracture.  Patient has known osteoporosis on Prolia.  She has cellulitis of the right lower extremity.  Blood cultures reveal gram positive cocci in clusters (Enterococcus).  Plan:   1.  Hematology/Oncology:  Stage IV ovarian cancer.  Per patient report, follow-up imaging to assess disease response to chemotherapy after the holidays.  Her Eliquis is on hold and heparin instituted for possible kyphoplasty.  Follow CBC with diff daily.  Chronic mild thrombocytopenia on chemotherapy, likely decreased secondary to consumption with current infection. 2.  Infectious disease:  Enterococcus bacteremia on vancomycin.  Sensitivities pending.  Blood cultures q 24 hours prn temp > 100.4 to ensure blood cultures clear.  Nystatin for thrush.  If does not resolve, will switch to fluconazole. 3.  Orthopedics:   L2 and L4 compression fracture.  By imaging, does not appear malignant.  Appreciate consult by Dr. Rudene Christians.  Anticipate kyphoplasty in future.   Thank you for allowing me to participate in Amanda Robbins 's care.  I will follow her closely with you while hospitalized until Dr. Gary Fleet return on 05/31/2015.   Lequita Asal, MD  05/30/2015, 3:37 PM

## 2015-05-30 NOTE — Plan of Care (Signed)
Problem: Safety: Goal: Ability to remain free from injury will improve Outcome: Progressing Fall precautions in place  Problem: Pain Managment: Goal: General experience of comfort will improve Outcome: Progressing Prn meds  Problem: Physical Regulation: Goal: Will remain free from infection Outcome: Not Progressing Antibiotics  Problem: Tissue Perfusion: Goal: Risk factors for ineffective tissue perfusion will decrease Outcome: Progressing Heparin gtt  Problem: Activity: Goal: Risk for activity intolerance will decrease Outcome: Progressing PT evaluation  Problem: Fluid Volume: Goal: Ability to maintain a balanced intake and output will improve Outcome: Progressing I&O's  Problem: Physical Regulation: Goal: Diagnostic test results will improve Outcome: Progressing Latic acid 1.3

## 2015-05-30 NOTE — Consult Note (Addendum)
ANTICOAGULATION CONSULT NOTE - Initial Consult  Pharmacy Consult for Heparin Indication: pulmonary embolus  Allergies  Allergen Reactions  . Aspirin     Other reaction(s): Localized superficial swelling of skin uncoated asa-causes throat to swell up  . Codeine Itching    Other reaction(s): Itching of Skin  . Fosamax  [Alendronate Sodium]     Other reaction(s): Distress (finding)  . Ibuprofen   . Ibuprofen     Other reaction(s): Distress (finding)  . Metronidazole Nausea And Vomiting  . Naproxen     Other reaction(s): Distress (finding)  . Naproxen Sodium   . Nsaids     GI UPSET / GERD  . Oxycodone     Other reaction(s): Diarrhea and vomiting (finding)  . Oxycodone-Acetaminophen     Other reaction(s): Diarrhea and vomiting (finding)    Patient Measurements: Height: 5\' 6"  (167.6 cm) Weight: 129 lb 12.8 oz (58.877 kg) IBW/kg (Calculated) : 59.3 Heparin Dosing Weight: 58.9kg  Vital Signs: Temp: 99.1 F (37.3 C) (12/18 0805) Temp Source: Oral (12/18 0805) BP: 107/43 mmHg (12/18 0805) Pulse Rate: 109 (12/18 0805)  Labs:  Recent Labs  05/29/15 1149 05/30/15 0430  HGB 11.7* 10.2*  HCT 33.9* 29.1*  PLT 92* 83*  CREATININE 0.91 0.66    Estimated Creatinine Clearance: 59.1 mL/min (by C-G formula based on Cr of 0.66).   Medical History: Past Medical History  Diagnosis Date  . 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 Irritable bowel   . C. difficile colitis 2011  . Allergic rhinitis     Followed by Dr. Donneta Romberg  . Esophageal reflux     Followed by GI physician, on Dexilant and Ranitidine  . Migraine   . 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 Osteoporosis 2009    on Reclast once yearly, Followed by Dr. Jefm Bryant  . Ovarian cancer ([place])   . COPD (chronic obstructive pulmonary disease) ([place])   . Thrombocytopenia ([place])   . MVP (mitral valve prolapse)   . SVT (supraventricular tachycardia) ([facility])     Medications:  Scheduled:   . acidophilus  1 capsule Oral Daily  . calcium-vitamin D  1 tablet Oral BID  . dexamethasone  4 mg Oral Daily  . furosemide  20 mg Oral Daily  . heparin  3,500 Units Intravenous Once  . multivitamin with minerals  1 tablet Oral Daily  . pantoprazole  40 mg Oral Daily  . potassium chloride SA  20 mEq Oral BID  . sodium chloride  3 mL Intravenous Q12H  . topiramate  25 mg Oral Daily  . vancomycin  1,000 mg Intravenous Q24H   Infusions:  . sodium chloride 100 mL/hr at 05/29/15 1830  . heparin      Assessment: Amanda Robbins is a 74 yo female admitted for cellulitis found to have PE. Pharmacy consulted to dose heparin in this patient.  Goal of Therapy:  Heparin level 0.3-0.7 units/ml Monitor platelets by anticoagulation protocol: Yes   Plan:  Give 3500 units bolus x 1 Start heparin infusion at 1000 units/hr Check anti-Xa level in 6 hours and daily while on heparin Continue to monitor H&H and platelets   Will adjust 12/18 2100 heparin level accordingly.  Of note, patient's platelets are low at 83. Patient is receiving treatment for metastatic ovarian cancer with brain mets. Spoke to Dr. Verdell Carmine, he is aware.   Patient on apixaban outpatient due to history of PE. Heparin drip started >12 hours after last apixaban  dose, however baseline APTT and INR labs were not ordered appropriately. Will be checking INR and APTT with 12/18 2100 heparin level and will adjust heparin dose accordingly.  Pharmacy will continue to monitor.  Roe Coombs, PharmD Pharmacy Resident 05/30/2015

## 2015-05-30 NOTE — Progress Notes (Signed)
Heparin gtt started at 1000units/hr after a 3500 unit bolus, verified by Junious Dresser, [place].  Pharmacy made aware of delay and need to retime Heparin level for later.

## 2015-05-30 NOTE — Evaluation (Signed)
Physical Therapy Evaluation Patient Details Name: Amanda Robbins MRN: [id number] DOB: [birth year] Today's Date: 05/30/2015   History of Present Illness  Amanda Robbins is a 74 y.o. 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 Patient states when she was trying to lift some weight 2 weeks ago she developed back pain which continued since then and worsened today. Denies any focal weakness or numbness, bladder or bowel disturbances. Denies any fever, chills, chest pain, nausea, vomiting, diarrhea, dysuria, abdominal pain. On arrival to the ED, patient was noted to be hypotensive with blood pressure 79/45 and tachycardic with heart rate of 115 and temperature of 97 degrees Fahrenheit. She was also noted to have redness and swelling of right lower extremity with increased local warmth. Lab work revealed WBC of 3.9, potassium 3.4 and lactate 3.1 and platelets 92K which is chronic. Urinalysis WBC 6-30 with trace LE. CT of the chest abdomen and the pelvis reported negative for PE/ infilltrate, acute intra-abdominal pathology but L2 and L4 compression fractures. She was given 2 L of IV fluids following which her blood pressure improved and currently is maintaining around 122/65. Heart rate also improved and currently maintaining running around 100-105. After obtaining blood cultures, patient was started on IV vancomycin with the diagnosis of sepsis due to cellulitis and hospitalist service was consulted for further management. Ortho consulted and is considering kyphoplasty pending input from oncology. Prior to injury pt reports independence with ADLs but assist for IADLs. Pt uses a rollator for mobility and denies falls in the last 12 months.   Clinical Impression  Pt demonstrates guarded mobility due to increase in low back with bed mobility and transfers.  Overall her mobility is functional but slow. Pt able to perform transfers and ambulation with CGA only but gait speed is decreased as is step length. No evidence of imbalance during gait with use of walker. However without UE support pt demonstrates poor balance in narrow stance. She has a history of neuropathy and is also on morphine currently to manage her pain both of which are likely contributing to her balance deficits. Pt may undergo kyphoplasty tomorrow and will continue to assess her pain and mobility after possible procedure. Pt does not need SNF placement currently but will need supervision at home. Husband planning to take patient home at discharge. She would also benefit from [facility] PT due to deconditioning/weakness as well as balance deficits. Pt will benefit from skilled PT services to address deficits in strength, balance, and mobility in order to return to full function at home.    Follow Up Recommendations Home health PT;Supervision - Intermittent    Equipment Recommendations  None recommended by PT    Recommendations for Other Services       Precautions / Restrictions Precautions Precautions: Fall Restrictions Weight Bearing Restrictions: No      Mobility  Bed Mobility Overal bed mobility: Needs Assistance Bed Mobility: Supine to Sit;Sit to Supine     Supine to sit: Min assist Sit to supine: Min assist   General bed mobility comments: Pt requires assist to perform safe log-roll technique to avoid excessive lumbar flexion due to compression fractures.  Transfers Overall transfer level: Needs assistance Equipment used: Rolling walker (2 wheeled) Transfers: Sit to/from Stand Sit to Stand: Min guard         General transfer comment: Pt demonstrates adequate LE  strength to come to standing without assist. She requires cues for safe hand placement during transfers. Pt encouraged to avoid excessive lumbar flexion during transfers. Performed sit to stand from bed and low  commode  Ambulation/Gait Ambulation/Gait assistance: Min guard Ambulation Distance (Feet): 75 Feet Assistive device: Rolling walker (2 wheeled) Gait Pattern/deviations: Decreased step length - right;Decreased step length - left Gait velocity: Decreased Gait velocity interpretation: Below normal speed for age/gender General Gait Details: Pt with decreased gait speed. She is guarded with ambulation requiring definite UE assist on rolling walker for balance. Vitals monitored and SaO2 remains >95% on room air. HR increases to 115-120 bpm during ambulation. Pt denies fatigue or DOE.   Stairs            Wheelchair Mobility    Modified Rankin (Stroke Patients Only)       Balance Overall balance assessment: Needs assistance Sitting-balance support: No upper extremity supported Sitting balance-Leahy Scale: Good     Standing balance support: No upper extremity supported Standing balance-Leahy Scale: Fair Standing balance comment: Fair balance in wide stance. Pt unable to achieve narrow stance without UE support. Once in narrow stance pt with LOB requiring therapist correction to prevent falls. Pt reports history of bilateral LE neuropathy                             Pertinent Vitals/Pain Pain Assessment: 0-10 Pain Score: 0-No pain Pain Location: Denies low back pain at rest in bed Pain Intervention(s): Limited activity within patient's tolerance;Monitored during session;Premedicated before session    [place] expects to be discharged to:: Private residence Living Arrangements: Spouse/significant other Available Help at Discharge: Family Type of Home: House Home Access: Stairs to enter Entrance Stairs-Rails: Right Entrance Stairs-Number of Steps: 4 Home Layout: One level Home Equipment: Grab bars - tub/shower;Grab bars - toilet;Shower seat;Wheelchair - manual;Bedside commode;Walker - 4 wheels;Walker - 2 wheels      Prior Function Level of  Independence: Needs assistance   Gait / Transfers Assistance Needed: Use of rollator     Comments: Assist for IADLs. Declining function s/p low back injury     Hand Dominance        Extremity/Trunk Assessment   Upper Extremity Assessment: Generalized weakness (UE/LE weakness but functional for mobility)           Lower Extremity Assessment: Generalized weakness         Communication   Communication: No difficulties  Cognition Arousal/Alertness: Awake/alert Behavior During Therapy: WFL for tasks assessed/performed Overall Cognitive Status: Within Functional Limits for tasks assessed                      General Comments      Exercises        Assessment/Plan    PT Assessment Patient needs continued PT services  PT Diagnosis Difficulty walking;Abnormality of gait;Generalized weakness;Acute pain   PT Problem List Decreased strength;Decreased activity tolerance;Decreased balance;Decreased mobility;Pain  PT Treatment Interventions DME instruction;Gait training;Stair training;Therapeutic activities;Therapeutic exercise;Balance training;Neuromuscular re-education;Patient/family education   PT Goals (Current goals can be found in the Care Plan section) Acute Rehab PT Goals Patient Stated Goal: Return to prior level of function without pain PT Goal Formulation: With patient/family Time For Goal Achievement: 06/13/15 Potential to Achieve Goals: Fair    Frequency 7X/week   Barriers to discharge        Co-evaluation  End of Session Equipment Utilized During Treatment: Gait belt Activity Tolerance: Patient tolerated treatment well Patient left: in bed;with call bell/phone within reach;with bed alarm set;with family/visitor present Nurse Communication: Mobility status (Urine output, IV alarm)         Time: [id number] PT Time Calculation (min) (ACUTE ONLY): 24 min   Charges:   PT Evaluation $Initial PT Evaluation Tier I: 1  Procedure PT Treatments $Therapeutic Activity: 8-22 mins   PT G Codes:       Lyndel Safe Luka Stohr PT, DPT   Ines Rebel 05/30/2015, 3:07 PM

## 2015-05-31 ENCOUNTER — Inpatient Hospital Stay: Payer: Medicare Other | Admitting: *Deleted

## 2015-05-31 ENCOUNTER — Encounter
Admission: EM | Disposition: A | Discharge status: Home Health | Payer: Self-pay | Source: Home / Self Care | Attending: Specialist

## 2015-05-31 ENCOUNTER — Inpatient Hospital Stay: Payer: Medicare Other

## 2015-05-31 DIAGNOSIS — D696 Thrombocytopenia, unspecified: Secondary | ICD-10-CM

## 2015-05-31 DIAGNOSIS — Z87891 Personal history of nicotine dependence: Secondary | ICD-10-CM

## 2015-05-31 DIAGNOSIS — E440 Moderate protein-calorie malnutrition: Secondary | ICD-10-CM | POA: Insufficient documentation

## 2015-05-31 DIAGNOSIS — B379 Candidiasis, unspecified: Secondary | ICD-10-CM

## 2015-05-31 DIAGNOSIS — S32028S Other fracture of second lumbar vertebra, sequela: Secondary | ICD-10-CM

## 2015-05-31 DIAGNOSIS — Z9221 Personal history of antineoplastic chemotherapy: Secondary | ICD-10-CM

## 2015-05-31 HISTORY — PX: KYPHOPLASTY: SHX5884

## 2015-05-31 LAB — URINE CULTURE

## 2015-05-31 LAB — HEPARIN LEVEL (UNFRACTIONATED)
HEPARIN UNFRACTIONATED: 1.42 [IU]/mL — AB (ref 0.30–0.70)
HEPARIN UNFRACTIONATED: 0.58 [IU]/mL (ref 0.30–0.70)
Heparin Unfractionated: 1.93 IU/mL — ABNORMAL HIGH (ref 0.30–0.70)

## 2015-05-31 LAB — APTT: aPTT: 38 seconds — ABNORMAL HIGH (ref 24–36)

## 2015-05-31 LAB — CBC
HCT: 27.1 % — ABNORMAL LOW (ref 35.0–47.0)
Hemoglobin: 9.3 g/dL — ABNORMAL LOW (ref 12.0–16.0)
MCH: 35.2 pg — ABNORMAL HIGH (ref 26.0–34.0)
MCHC: 34.4 g/dL (ref 32.0–36.0)
MCV: 102.3 fL — ABNORMAL HIGH (ref 80.0–100.0)
PLATELETS: 73 10*3/uL — AB (ref 150–440)
RBC: 2.65 MIL/uL — AB (ref 3.80–5.20)
RDW: 14.9 % — AB (ref 11.5–14.5)
WBC: 4.1 10*3/uL (ref 3.6–11.0)

## 2015-05-31 LAB — PROTIME-INR
INR: 1.54
PROTHROMBIN TIME: 18.6 s — AB (ref 11.4–15.0)

## 2015-05-31 SURGERY — KYPHOPLASTY
Anesthesia: Monitor Anesthesia Care | Site: Back | Wound class: Clean

## 2015-05-31 MED ORDER — METOCLOPRAMIDE HCL 5 MG/ML IJ SOLN: 5.0000 mg | Freq: Three times a day (TID) | INTRAMUSCULAR | Status: DC | PRN

## 2015-05-31 MED ORDER — ENSURE ENLIVE PO LIQD
237.0000 mL | Freq: Two times a day (BID) | ORAL | Status: DC
  Administered 2015-06-01 (×2): 237 mL via ORAL

## 2015-05-31 MED ORDER — MAGNESIUM HYDROXIDE 400 MG/5ML PO SUSP: 30.0000 mL | Freq: Every day | ORAL | Status: DC | PRN

## 2015-05-31 MED ORDER — CEFAZOLIN SODIUM 1-5 GM-% IV SOLN
1.0000 g | Freq: Three times a day (TID) | INTRAVENOUS | Status: DC
  Administered 2015-05-31: 1 g via INTRAVENOUS
  Filled 2015-05-31 (×4): qty 50

## 2015-05-31 MED ORDER — TRANEXAMIC ACID 1000 MG/10ML IV SOLN
1000.0000 mg | INTRAVENOUS | Status: AC
  Administered 2015-05-31: 1000 mg via INTRAVENOUS
  Filled 2015-05-31: qty 10

## 2015-05-31 MED ORDER — HEPARIN (PORCINE) IN NACL 100-0.45 UNIT/ML-% IJ SOLN: 700.0000 [IU]/h | INTRAMUSCULAR | Status: DC

## 2015-05-31 MED ORDER — LIDOCAINE HCL 1 % IJ SOLN
INTRAMUSCULAR | Status: DC | PRN
  Administered 2015-05-31: 10 mL

## 2015-05-31 MED ORDER — DOCUSATE SODIUM 100 MG PO CAPS
100.0000 mg | ORAL_CAPSULE | Freq: Two times a day (BID) | ORAL | Status: DC
  Administered 2015-06-01 – 2015-06-02 (×3): 100 mg via ORAL
  Filled 2015-05-31 (×3): qty 1

## 2015-05-31 MED ORDER — AMOXICILLIN 500 MG PO CAPS
500.0000 mg | ORAL_CAPSULE | Freq: Three times a day (TID) | ORAL | Status: DC
  Administered 2015-06-01 – 2015-06-02 (×5): 500 mg via ORAL
  Filled 2015-05-31 (×8): qty 1

## 2015-05-31 MED ORDER — FENTANYL CITRATE (PF) 100 MCG/2ML IJ SOLN
INTRAMUSCULAR | Status: DC | PRN
  Administered 2015-05-31 (×2): 25 ug via INTRAVENOUS

## 2015-05-31 MED ORDER — METOCLOPRAMIDE HCL 5 MG PO TABS: 5.0000 mg | ORAL_TABLET | Freq: Three times a day (TID) | ORAL | Status: DC | PRN

## 2015-05-31 MED ORDER — MAGNESIUM CITRATE PO SOLN: 1.0000 | Freq: Once | ORAL | Status: DC | PRN

## 2015-05-31 MED ORDER — BISACODYL 10 MG RE SUPP: 10.0000 mg | Freq: Every day | RECTAL | Status: DC | PRN

## 2015-05-31 MED ORDER — LACTATED RINGERS IV SOLN
INTRAVENOUS | Status: DC | PRN
  Administered 2015-05-31: 18:00:00 via INTRAVENOUS

## 2015-05-31 MED ORDER — FLUCONAZOLE 100 MG PO TABS
100.0000 mg | ORAL_TABLET | Freq: Every day | ORAL | Status: DC
  Administered 2015-06-01 – 2015-06-02 (×2): 100 mg via ORAL
  Filled 2015-05-31 (×2): qty 1

## 2015-05-31 MED ORDER — DIAZEPAM 5 MG/ML IJ SOLN
2.5000 mg | INTRAMUSCULAR | Status: DC | PRN
  Administered 2015-05-31: 23:00:00 2.5 mg via INTRAVENOUS
  Filled 2015-05-31: qty 2

## 2015-05-31 MED ORDER — MIDAZOLAM HCL 2 MG/2ML IJ SOLN
INTRAMUSCULAR | Status: DC | PRN
  Administered 2015-05-31 (×2): 1 mg via INTRAVENOUS

## 2015-05-31 MED ORDER — BUPIVACAINE-EPINEPHRINE (PF) 0.5% -1:200000 IJ SOLN
INTRAMUSCULAR | Status: DC | PRN
  Administered 2015-05-31: 40 mL via PERINEURAL

## 2015-05-31 MED ORDER — DOXYCYCLINE HYCLATE 100 MG PO TABS
100.0000 mg | ORAL_TABLET | Freq: Two times a day (BID) | ORAL | Status: DC
  Administered 2015-06-01 – 2015-06-02 (×3): 100 mg via ORAL
  Filled 2015-05-31 (×3): qty 1

## 2015-05-31 MED ORDER — LIDOCAINE HCL (CARDIAC) 20 MG/ML IV SOLN
INTRAVENOUS | Status: DC | PRN
  Administered 2015-05-31 (×2): 20 mg via INTRAVENOUS

## 2015-05-31 SURGICAL SUPPLY — 21 items
BAG DECANTER FOR FLEXI CONT (MISCELLANEOUS) ×3 IMPLANT
BASIN GRAD PLASTIC 32OZ STRL (MISCELLANEOUS) ×3 IMPLANT
BIT DRILL KYPHON EXPRESS (MISCELLANEOUS) ×2 IMPLANT
CEMENT KYPHON CX01A KIT/MIXER (Cement) ×3 IMPLANT
DEVICE BIOPSY BONE KYPHX (INSTRUMENTS) ×3 IMPLANT
DRAPE C-ARM XRAY 36X54 (DRAPES) ×3 IMPLANT
DRESSING ALLEVYN LIFE SACRUM (GAUZE/BANDAGES/DRESSINGS) ×3 IMPLANT
DRILL KYPHON EXPRESS (MISCELLANEOUS) ×6
DURAPREP 26ML APPLICATOR (WOUND CARE) ×3 IMPLANT
GLOVE SURG ORTHO 9.0 STRL STRW (GLOVE) ×3 IMPLANT
GOWN SPECIALTY ULTRA XL (MISCELLANEOUS) ×3 IMPLANT
GOWN STRL REUS W/ TWL LRG LVL3 (GOWN DISPOSABLE) ×1 IMPLANT
GOWN STRL REUS W/TWL LRG LVL3 (GOWN DISPOSABLE) ×2
LIQUID BAND (GAUZE/BANDAGES/DRESSINGS) ×3 IMPLANT
PACK KYPHOPLASTY (MISCELLANEOUS) ×3 IMPLANT
STRAP SAFETY BODY (MISCELLANEOUS) ×3 IMPLANT
SYR 30ML LL (SYRINGE) ×3 IMPLANT
SYR 50ML LL SCALE MARK (SYRINGE) ×3 IMPLANT
TRAY KYPHOPAK 15/2 EXPRESS (KITS) ×3 IMPLANT
TRAY KYPHOPAK 15/3 EXPRESS 1ST (MISCELLANEOUS) ×3 IMPLANT
TRAY KYPHOPAK 20/3 EXPRESS 1ST (MISCELLANEOUS) IMPLANT

## 2015-05-31 NOTE — Progress Notes (Signed)
MD and pharmacy aware of critical lab aptt >160.  Waiting on pharmacy to review.

## 2015-05-31 NOTE — Consult Note (Signed)
ANTICOAGULATION CONSULT NOTE -FOLLOW UP   Pharmacy Consult for Heparin Indication: pulmonary embolus  Allergies  Allergen Reactions  . Aspirin     Other reaction(s): Localized superficial swelling of skin uncoated asa-causes throat to swell up  . Codeine Itching    Other reaction(s): Itching of Skin  . Fosamax  [Alendronate Sodium]     Other reaction(s): Distress (finding)  . Ibuprofen   . Ibuprofen     Other reaction(s): Distress (finding)  . Metronidazole Nausea And Vomiting  . Naproxen     Other reaction(s): Distress (finding)  . Naproxen Sodium   . Nsaids     GI UPSET / GERD  . Oxycodone     Other reaction(s): Diarrhea and vomiting (finding)  . Oxycodone-Acetaminophen     Other reaction(s): Diarrhea and vomiting (finding)    Patient Measurements: Height: 5\' 6"  (167.6 cm) Weight: 160 lb (72.576 kg) IBW/kg (Calculated) : 59.3 Heparin Dosing Weight: 58.9kg  Vital Signs: Temp: 98.4 F (36.9 C) (12/19 0544) Temp Source: Oral (12/19 0544) BP: 117/84 mmHg (12/19 0544) Pulse Rate: 95 (12/19 0544)  Labs:  Recent Labs  05/29/15 1149 05/30/15 0430 05/30/15 2319 05/31/15 0846  HGB 11.7* 10.2*  --  9.3*  HCT 33.9* 29.1*  --  27.1*  PLT 92* 83*  --  73*  APTT  --   --  >160* >160*  LABPROT  --   --  18.6*  --   INR  --   --  1.54  --   HEPARINUNFRC  --   --  1.93* 1.42*  CREATININE 0.91 0.66  --   --     Estimated Creatinine Clearance: 64.8 mL/min (by C-G formula based on Cr of 0.66).   Medical History: Past Medical History  Diagnosis Date  . 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 Irritable bowel   . C. difficile colitis 2011  . Allergic rhinitis     Followed by Dr. Donneta Romberg  . Esophageal reflux     Followed by GI physician, on Dexilant and Ranitidine  . Migraine   . 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 Osteoporosis 2009    on Reclast once yearly, Followed by Dr. Jefm Bryant  . Ovarian cancer ([place])   . COPD  (chronic obstructive pulmonary disease) ([place])   . Thrombocytopenia ([place])   . MVP (mitral valve prolapse)   . SVT (supraventricular tachycardia) ([facility])     Medications:  Scheduled:  . acidophilus  1 capsule Oral Daily  . calcium-vitamin D  1 tablet Oral BID  .  ceFAZolin (ANCEF) IV  1 g Intravenous 3 times per day  . dexamethasone  4 mg Oral Daily  . furosemide  20 mg Oral Daily  . multivitamin with minerals  1 tablet Oral Daily  . nystatin  5 mL Oral QID  . pantoprazole  40 mg Oral Daily  . potassium chloride SA  20 mEq Oral BID  . sodium chloride  3 mL Intravenous Q12H  . topiramate  25 mg Oral Daily  . vancomycin  1,000 mg Intravenous Q24H   Infusions:  . sodium chloride 100 mL/hr at 05/31/15 0120  . heparin 800 Units/hr (05/31/15 0248)    Assessment: Amanda Robbins is a 74 yo female admitted for cellulitis found to have PE. Pharmacy consulted to dose heparin in this patient.  Goal of Therapy:  Heparin level 0.3-0.7 units/ml Monitor platelets by anticoagulation protocol: Yes   Plan:  Give 3500 units bolus  x 1 Start heparin infusion at 1000 units/hr Check anti-Xa level in 6 hours and daily while on heparin Continue to monitor H&H and platelets   Will adjust 12/18 2100 heparin level accordingly.  Of note, patient's platelets are low at 83. Patient is receiving treatment for metastatic ovarian cancer with brain mets. Spoke to Dr. Verdell Carmine, he is aware.   Patient on apixaban outpatient due to history of PE. Heparin drip started >12 hours after last apixaban dose, however baseline APTT and INR labs were not ordered appropriately. Will be checking INR and APTT with 12/18 2100 heparin level and will adjust heparin dose accordingly.  12/19 2300 anti-Xa 1.93, aPTT >160. Hold infusion x 1 hour and restart at 800 units/hr. Recheck heparin level, CBC, aPTT 6 hours after restart of infusion.  12/19 APTT >160. Will reduce rate to 700 units/hr. Will order another APTT to be drawn @ 16:00.    Pharmacy will continue to monitor. Larene Beach, PharmD 05/31/2015

## 2015-05-31 NOTE — Progress Notes (Signed)
[facility] NAME: Amanda Robbins    MR#:  [id number]  DATE OF BIRTH:  11/25/1940  SUBJECTIVE:   Patient here due to back pain and noted to have a compression fracture at L2-L3 level. She also here with sepsis secondary to a right lower extremity cellulitis. Going for Kyphoplasty today.  Afebrile, hemodynamically stable.    REVIEW OF SYSTEMS:    Review of Systems  Constitutional: Positive for malaise/fatigue. Negative for fever and chills.  HENT: Negative for congestion and tinnitus.   Eyes: Negative for blurred vision and double vision.  Respiratory: Negative for cough, shortness of breath and wheezing.   Cardiovascular: Negative for chest pain, orthopnea and PND.  Gastrointestinal: Negative for nausea, vomiting, abdominal pain and diarrhea.  Genitourinary: Negative for dysuria and hematuria.  Musculoskeletal: Positive for back pain.  Neurological: Positive for weakness. Negative for dizziness, sensory change and focal weakness.  All other systems reviewed and are negative.   Nutrition: Regular Tolerating Diet: Yes Tolerating PT: Await evaluation   DRUG ALLERGIES:   Allergies  Allergen Reactions  . Aspirin     Other reaction(s): Localized superficial swelling of skin uncoated asa-causes throat to swell up  . Codeine Itching    Other reaction(s): Itching of Skin  . Fosamax  [Alendronate Sodium]     Other reaction(s): Distress (finding)  . Ibuprofen   . Ibuprofen     Other reaction(s): Distress (finding)  . Metronidazole Nausea And Vomiting  . Naproxen     Other reaction(s): Distress (finding)  . Naproxen Sodium   . Nsaids     GI UPSET / GERD  . Oxycodone     Other reaction(s): Diarrhea and vomiting (finding)  . Oxycodone-Acetaminophen     Other reaction(s): Diarrhea and vomiting (finding)    VITALS:  Blood pressure 117/84, pulse 95, temperature 98.4 F (36.9 C), temperature source Oral, resp. rate 18,  height 5' 6"  (1.676 m), weight 72.576 kg (160 lb), SpO2 100 %.  PHYSICAL EXAMINATION:   Physical Exam  GENERAL:  74 y.o.-year-old patient lying in the bed with no acute distress.  EYES: Pupils equal, round, reactive to light and accommodation. No scleral icterus. Extraocular muscles intact.  HEENT: Head atraumatic, normocephalic. Oropharynx and nasopharynx clear.  NECK:  Supple, no jugular venous distention. No thyroid enlargement, no tenderness.  LUNGS: Normal breath sounds bilaterally, no wheezing, rales, rhonchi. No use of accessory muscles of respiration.  CARDIOVASCULAR: S1, S2 normal. No murmurs, rubs, or gallops.  ABDOMEN: Soft, nontender, nondistended. Bowel sounds present. No organomegaly or mass.  EXTREMITIES: No cyanosis, clubbing or edema b/l.   RLE redness and warmth consistent w/ cellulitis.  NEUROLOGIC: Cranial nerves II through XII are intact. No focal Motor or sensory deficits b/l.   PSYCHIATRIC: The patient is alert and oriented x 3. Good affect.   SKIN: No obvious rash, lesion, or ulcer. RLE cellulitis.    LABORATORY PANEL:   CBC  Recent Labs Lab 05/31/15 0846  WBC 4.1  HGB 9.3*  HCT 27.1*  PLT 73*   ------------------------------------------------------------------------------------------------------------------  Chemistries   Recent Labs Lab 05/29/15 1149 05/30/15 0430  NA 134* 134*  K 3.4* 3.2*  CL 103 103  CO2 26 24  GLUCOSE 95 105*  BUN 23* 16  CREATININE 0.91 0.66  CALCIUM 8.8* 7.9*  AST 33  --   ALT 32  --   ALKPHOS 65  --   BILITOT 1.3*  --    ------------------------------------------------------------------------------------------------------------------  Cardiac Enzymes No results for input(s): TROPONINI in the last 168 hours. ------------------------------------------------------------------------------------------------------------------  RADIOLOGY:  Ct Angio Chest Pe W/cm &/or Wo Cm  05/29/2015  CLINICAL DATA:  Back pain  for 2 weeks EXAM: CT ANGIOGRAPHY CHEST, ABDOMEN AND PELVIS TECHNIQUE: Multidetector CT imaging through the chest, abdomen and pelvis was performed using the standard protocol during bolus administration of intravenous contrast. Multiplanar reconstructed images and MIPs were obtained and reviewed to evaluate the vascular anatomy. CONTRAST:  67m OMNIPAQUE IOHEXOL 350 MG/ML SOLN COMPARISON:  03/03/2015. FINDINGS: CTA CHEST FINDINGS Lungs are well aerated bilaterally. Biapical subpleural and parenchymal scarring is noted. No sizable infiltrate is seen. Mild scarring is noted inferiorly in the right lung as well. The thoracic inlet is within normal limits. The thoracic aorta and its branches show mild atherosclerotic change. No aneurysmal dilatation or dissection is identified. The pulmonary artery as visualized is within normal limits. No significant hilar or mediastinal adenopathy is noted. No acute bony abnormality is noted. Review of the MIP images confirms the above findings. CTA ABDOMEN AND PELVIS FINDINGS The gallbladder has been surgically removed. The liver is diffusely decreased in attenuation consistent with fatty infiltration. The spleen, adrenal glands and pancreas are within normal limits. Kidneys are well visualized without evidence renal calculi or urinary tract obstructive change. A left retro aortic renal vein is noted. The urinary bladder is within normal limits. Mild diverticular change is noted without evidence of diverticulitis. The appendix is not well seen. No inflammatory changes are noted. The uterus has been surgically removed. The abdominal aorta shows a normal branching pattern. Mild aortic calcifications are seen. No aneurysm or dissection is identified. The osseous structures show compression deformity of the superior endplate of L2 and inferior endplate of L4. These changes are new from the prior exam. Review of the MIP images confirms the above findings. IMPRESSION: No evidence of  dissection or aneurysmal dilatation. No evidence of pulmonary embolism. L2 and L4 compression deformities as described which are new from September of 2016. They may be related to the patient's recent injury. Electronically Signed   By: MInez CatalinaM.D.   On: 05/29/2015 14:22   Mr Lumbar Spine W Wo Contrast  05/30/2015  CLINICAL DATA:  Low back pain following twisting injury approximately 2 weeks ago. New fractures at L2 and L4 on recent PET-CT. History of metastatic ovarian cancer. EXAM: MRI LUMBAR SPINE WITHOUT AND WITH CONTRAST TECHNIQUE: Multiplanar and multiecho pulse sequences of the lumbar spine were obtained without and with intravenous contrast. CONTRAST:  133mMULTIHANCE GADOBENATE DIMEGLUMINE 529 MG/ML IV SOLN COMPARISON:  PET-CT 03/03/2015. Abdominal pelvic CT 05/25/2014 and 05/29/2015. FINDINGS: Segmentation: Conventional anatomy assumed, with the last open disc space designated L5-S1. Alignment:  Normal. Bones: As demonstrated on recent CT, there are fractures at L2 and L4 which are new from the PET-CT of September. At L2, there is a superior endplate compression fracture resulting in approximately 20% loss vertebral body height and 6 mm of osseous retropulsion. At L4, there is an inferior endplate compression deformity resulting in less than 20% loss of vertebral body height centrally and mild osseous retropulsion. Both fractures demonstrate mild adjacent marrow edema and enhancement. Neither fracture demonstrates any pathologic features or associated epidural hematoma. There are no osseous findings worrisome for metastatic disease. There is a 5 mm lesion in the left L4 pedicle which demonstrates increased T1 and T2 signal, probably a small hemangioma. This is not clearly seen on older or recent CTs. Conus medullaris: Extends to the  L1-2 level and appears normal. No abnormal intradural enhancement. Paraspinal and other soft tissues: There is a small amount of paraspinal edema adjacent to both  fractures. The visualized retroperitoneum appears grossly stable with small lymph nodes. There is a possible small right-sided retrocrural lymphocele, measuring 11 mm on axial image 3 of series 5. Disc levels: L1-2: The disc appears normal. There is mild osseous retropulsion related to the L2 fracture. No foraminal compromise or nerve root encroachment. L2-3: Mild disc desiccation with preserved height. Mild bilateral facet hypertrophy. No spinal stenosis or nerve root encroachment. L3-4: Mild disc desiccation with preserved height. Mild bilateral facet hypertrophy. No significant spinal stenosis or nerve root encroachment. L4-5: Disc bulging, facet and ligamentous hypertrophy superimposed on short pedicles result in mild spinal stenosis. There is minimal osseous retropulsion related to the L4 fracture. No evidence of nerve root encroachment. L5-S1: Chronic degenerative disc disease with loss of disc height, paraspinal osteophytes and vacuum phenomenon. No significant spinal stenosis or nerve root encroachment. IMPRESSION: 1. The recently demonstrated fractures at L2 and L4 are unchanged from CT of yesterday. These are consistent with benign/posttraumatic fractures and appear acute/subacute in age. There is mild associated osseous retropulsion and paraspinal hemorrhage. 2. No evidence of metastatic disease or pathologic fracture. 3. Mild spondylosis, greatest at L5-S1. No evidence of nerve root encroachment Electronically Signed   By: Richardean Sale M.D.   On: 05/30/2015 13:06   Ct Cta Abd/pel W/cm &/or W/o Cm  05/29/2015  CLINICAL DATA:  Back pain for 2 weeks EXAM: CT ANGIOGRAPHY CHEST, ABDOMEN AND PELVIS TECHNIQUE: Multidetector CT imaging through the chest, abdomen and pelvis was performed using the standard protocol during bolus administration of intravenous contrast. Multiplanar reconstructed images and MIPs were obtained and reviewed to evaluate the vascular anatomy. CONTRAST:  16m OMNIPAQUE IOHEXOL 350  MG/ML SOLN COMPARISON:  03/03/2015. FINDINGS: CTA CHEST FINDINGS Lungs are well aerated bilaterally. Biapical subpleural and parenchymal scarring is noted. No sizable infiltrate is seen. Mild scarring is noted inferiorly in the right lung as well. The thoracic inlet is within normal limits. The thoracic aorta and its branches show mild atherosclerotic change. No aneurysmal dilatation or dissection is identified. The pulmonary artery as visualized is within normal limits. No significant hilar or mediastinal adenopathy is noted. No acute bony abnormality is noted. Review of the MIP images confirms the above findings. CTA ABDOMEN AND PELVIS FINDINGS The gallbladder has been surgically removed. The liver is diffusely decreased in attenuation consistent with fatty infiltration. The spleen, adrenal glands and pancreas are within normal limits. Kidneys are well visualized without evidence renal calculi or urinary tract obstructive change. A left retro aortic renal vein is noted. The urinary bladder is within normal limits. Mild diverticular change is noted without evidence of diverticulitis. The appendix is not well seen. No inflammatory changes are noted. The uterus has been surgically removed. The abdominal aorta shows a normal branching pattern. Mild aortic calcifications are seen. No aneurysm or dissection is identified. The osseous structures show compression deformity of the superior endplate of L2 and inferior endplate of L4. These changes are new from the prior exam. Review of the MIP images confirms the above findings. IMPRESSION: No evidence of dissection or aneurysmal dilatation. No evidence of pulmonary embolism. L2 and L4 compression deformities as described which are new from September of 2016. They may be related to the patient's recent injury. Electronically Signed   By: MInez CatalinaM.D.   On: 05/29/2015 14:22     ASSESSMENT AND  PLAN:   74 year old female with past medical history of metastatic  ovarian cancer, chronic anemia/thrombocytopenia, GERD, COPD, history of SVT, history of mitral valve prolapse, who presented to the hospital due to back pain and also noted to have sepsis secondary cellulitis.  #1 back pain-this is secondary to a compression fracture in the L2-L4 area. -Patient apparently tried to lift something a few weeks back and developed acute back pain. -Appreciate orthopedics input and going for Kyphoplasty today.   - cont. supportive care with pain control for now.  #2 sepsis-patient met criteria on admission given her hypotension/tachycardia and elevated lactic acid. -Patient's urine culture and blood cultures are positive for enterococcus. -I will continue IV vancomycin for now. Will switch to oral amoxicillin and doxycycline tomorrow. Burnis Medin repeat blood cultures tomorrow to make sure they're clearing.  #3 right lower extremity cellulitis-this was the patient's sepsis. -Continue IV vancomycin, Blood cultures + for Enteroccus but that's likely from the UTI. - Afebrile, hemodynamically stable presently.  #4 history of PE-patient is on Eliquis, but no on heparin gtt as pt. To go for kyphoplasty today.   #5 chronic anemia/thrombocytopenia-due to underlying malignancy and patient getting chemotherapy. -Continue to follow counts. No acute need for transfusion presently.  #6 Ovarian Cancer w/ mets - await oncology consult. Prognosis is poor.  - cont. Decadron.    #7 GERD - cont. Protonix.    Will get PT eval post-kyphoplasty.   All the records are reviewed and case discussed with Care Management/Social Workerr. Management plans discussed with the patient, family and they are in agreement.  CODE STATUS: Full  DVT Prophylaxis: Heparin drip  TOTAL TIME TAKING CARE OF THIS PATIENT: 35 minutes.   POSSIBLE D/C IN 1-2 DAYS, DEPENDING ON CLINICAL CONDITION.   Henreitta Leber M.D on 05/31/2015 at 2:00 PM  Between 7am to 6pm - Pager - [phone]  After 6pm go  to [email or web address] - password [email or web address] [facility]  Office  [phone]  CC: Primary care physician; Rica Mast, MD

## 2015-05-31 NOTE — Plan of Care (Signed)
Pt's heparin drip was stopped in order for pt to have kyphoplasty for fractured L2-L3.  Her oncology dr approved this.  On heparin drip for Hx of DVTs.  aptt and unfractioned heparin levels sufficiently improved for pt to have procedure.  Pt c/o pain and was relieved w/morphine 2x.  Did walk to BR several times.  Pt has cellulitis bilaterally LE - worse on Right than left.  Pt receiving IV abx for this.

## 2015-05-31 NOTE — Anesthesia Preprocedure Evaluation (Signed)
Anesthesia Evaluation  Patient identified by MRN, date of birth, ID band Patient awake    Reviewed: Allergy & Precautions, NPO status , Patient's Chart, lab work & pertinent test results  Airway Mallampati: II       Dental no notable dental hx.    Pulmonary COPD, former smoker,     + decreased breath sounds      Cardiovascular Exercise Tolerance: Poor  Rhythm:Regular     Neuro/Psych  Headaches,    GI/Hepatic Neg liver ROS, GERD  ,  Endo/Other  negative endocrine ROS  Renal/GU negative Renal ROS     Musculoskeletal   Abdominal Normal abdominal exam  (+)   Peds  Hematology  (+) anemia ,   Anesthesia Other Findings   Reproductive/Obstetrics Ovarian Ca.                             Anesthesia Physical Anesthesia Plan  ASA: III  Anesthesia Plan: MAC   Post-op Pain Management:    Induction: Intravenous  Airway Management Planned: Nasal Cannula  Additional Equipment:   Intra-op Plan:   Post-operative Plan:   Informed Consent: I have reviewed the patients History and Physical, chart, labs and discussed the procedure including the risks, benefits and alternatives for the proposed anesthesia with the patient or authorized representative who has indicated his/her understanding and acceptance.     Plan Discussed with: CRNA  Anesthesia Plan Comments:         Anesthesia Quick Evaluation

## 2015-05-31 NOTE — Care Management Important Message (Signed)
Important Message  Patient Details  Name: Amanda Robbins MRN: [id number] Date of Birth: 01/23/1941   Medicare Important Message Given:  Yes    [name] 05/31/2015, 10:00 AM

## 2015-05-31 NOTE — Progress Notes (Signed)
Initial Nutrition Assessment  DOCUMENTATION CODES:   Non-severe (moderate) malnutrition in context of chronic illness  INTERVENTION:   Coordination of Care: await diet progression as medically able. Recommend new weight as pt recorded actual weight 30lbs less on admission 12/17.  Meals and Snacks: Cater to patient preferences once advanced, per pt she is lactose intolerant and prefers Lactaid milk Medical Food Supplement Therapy: will recommend Ensure Enlive po BID, each supplement provides 350 kcal and 20 grams of protein, will send with Lactaid milk per pt's request   NUTRITION DIAGNOSIS:   Malnutrition related to chronic illness, cancer and cancer related treatments as evidenced by severe depletion of muscle mass, moderate depletion of body fat.  GOAL:   Patient will meet greater than or equal to 90% of their needs  MONITOR:    (Energy Intake, Electrolyte and Renal Profile, Digestive system, Anthropometrics)  REASON FOR ASSESSMENT:   Malnutrition Screening Tool    ASSESSMENT:   Pt admitted with sepsis with RLE cellulitis and back pain. Pt noted to have L2-L3 compression fracture scheduled for kyphoplasty 12/19. Pt with h/o ovarian cancer with mets to [facility] on chemo and radiation per MD note.  Past Medical History  Diagnosis Date  . 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 Irritable bowel   . C. difficile colitis 2011  . Allergic rhinitis     Followed by Dr. Donneta Romberg  . Esophageal reflux     Followed by GI physician, on Dexilant and Ranitidine  . Migraine   . 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 Osteoporosis 2009    on Reclast once yearly, Followed by Dr. Jefm Bryant  . Ovarian cancer ([place])   . COPD (chronic obstructive pulmonary disease) ([place])   . Thrombocytopenia ([place])   . MVP (mitral valve prolapse)   . SVT (supraventricular tachycardia) ([facility])     Diet Order:  Diet NPO time specified   Current Nutrition: Pt  NPO  Food/Nutrition-Related History: Pt reports good appetite PTA however MST reports decrease in appetite. Pt reports eating 3 meals per day with snack at 10am and 3pm. Pt reports snacks are usually nuts, granola bars, fruits, cereal.  Pt reports having tolerating meals yesterday, RD notes pt ate 50% of meals yesterday. Pt reports drinking Chocolate Ensure and dilutes it some with Lactaid milk at home PTA.  Pt reports having lactose intolerance and preferring Lactaid milk. Pt reports not having trouble with metal silverware, however is experiencing thrush currently with some difficulty swallowing/chewing at times.   Scheduled Medications:  . acidophilus  1 capsule Oral Daily  . calcium-vitamin D  1 tablet Oral BID  .  ceFAZolin (ANCEF) IV  1 g Intravenous 3 times per day  . dexamethasone  4 mg Oral Daily  . furosemide  20 mg Oral Daily  . multivitamin with minerals  1 tablet Oral Daily  . nystatin  5 mL Oral QID  . pantoprazole  40 mg Oral Daily  . potassium chloride SA  20 mEq Oral BID  . sodium chloride  3 mL Intravenous Q12H  . topiramate  25 mg Oral Daily  . vancomycin  1,000 mg Intravenous Q24H    Continuous Medications:  . sodium chloride 100 mL/hr at 05/31/15 0120  . heparin 800 Units/hr (05/31/15 0248)     Electrolyte/Renal Profile and Glucose Profile:   Recent Labs Lab 05/25/15 0917 05/29/15 1149 05/30/15 0430  NA 131* 134* 134*  K 3.8 3.4* 3.2*  CL 101  103 103  CO2 23 26 24   BUN 25* 23* 16  CREATININE 0.78 0.91 0.66  CALCIUM 8.6* 8.8* 7.9*  GLUCOSE 90 95 105*   Protein Profile:  Recent Labs Lab 05/25/15 0917 05/29/15 1149  ALBUMIN 3.2* 3.0*   Nutritional Anemia Profile:  CBC Latest Ref Rng 05/31/2015 05/30/2015 05/29/2015  WBC 3.6 - 11.0 K/uL 4.1 4.9 3.9  Hemoglobin 12.0 - 16.0 g/dL 9.3(L) 10.2(L) 11.7(L)  Hematocrit 35.0 - 47.0 % 27.1(L) 29.1(L) 33.9(L)  Platelets 150 - 440 K/uL 73(L) 83(L) 92(L)    Gastrointestinal Profile: Last BM:   05/30/2015   Nutrition-Focused Physical Exam Findings: Nutrition-Focused physical exam completed. Findings are mild-moderate fat depletion, moderate-severe muscle depletion, and 2+ edema of BLLE.     Weight Change: Pt reports weight currently of 127lbs and reports weight of 142lbs one year ago (11% weight loss in one year). Question accuracy of weight recorded this am as pt actual weight on admission of 129lbs. CNA Chelsie agreeable to zero bed and reweigh when able.   Skin:  Reviewed, no issues   Height:   Ht Readings from Last 1 Encounters:  05/29/15 5\' 6"  (1.676 m)    Weight:   Wt Readings from Last 1 Encounters:  05/31/15 160 lb (72.576 kg)    Wt Readings from Last 10 Encounters:  05/31/15 160 lb (72.576 kg)  05/25/15 125 lb 14.1 oz (57.1 kg)  05/18/15 124 lb 12.5 oz (56.6 kg)  05/11/15 121 lb 14.6 oz (55.3 kg)  04/27/15 123 lb 7.3 oz (56 kg)  04/20/15 126 lb 15.8 oz (57.6 kg)  04/13/15 128 lb 15.5 oz (58.5 kg)  04/01/15 124 lb 9 oz (56.5 kg)  03/25/15 123 lb 0.3 oz (55.8 kg)  03/16/15 123 lb 0.3 oz (55.801 kg)     BMI:  Body mass index is 25.84 kg/(m^2).  Estimated Nutritional Needs:   Kcal:  using IBW of 59kg, BEE: 1117kcals, TEE: (IF 1.1-1.3)(AF 1.3) 1596-1888kcals  Protein:  65-77g protein (1.1-1.3g/kg)  Fluid:  1475-1778mL of fluid (25-103mL/kg)  EDUCATION NEEDS:   No education needs identified at this time   Nolan, RD, LDN Pager [phone]

## 2015-05-31 NOTE — Progress Notes (Signed)
[place]  [phone] [phone]  ID: QUINLYN DELAO OB: [birth year]  MR#: [id number]  [id number]  Patient Care Team: Jackolyn Confer, MD as PCP - General (Internal Medicine)  CHIEF COMPLAINT:  Chief Complaint  Patient presents with  . Back Pain    INTERVAL HISTORY: Patient continues to have back pain, but this is improved. She also has right lower extremity cellulitis with positive blood cultures. Patient is scheduled for kyphoplasty later this afternoon.  She offers no further complaints.  REVIEW OF SYSTEMS:   Review of Systems  Constitutional: Positive for malaise/fatigue. Negative for fever and weight loss.  Respiratory: Negative.   Cardiovascular: Negative.   Musculoskeletal: Positive for back pain.  Neurological: Positive for weakness.    As per HPI. Otherwise, a complete review of systems is negatve.  PAST MEDICAL HISTORY: Past Medical History  Diagnosis Date  . Ischemic bowel syndrome ([place]) 06/2010  . Irritable bowel   . C. difficile colitis 2011  . Allergic rhinitis     Followed by Dr. Donneta Romberg  . Esophageal reflux     Followed by GI physician, on Dexilant and Ranitidine  . Migraine   . 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 Osteoporosis 2009    on Reclast once yearly, Followed by Dr. Jefm Bryant  . Ovarian cancer ([place])   . COPD (chronic obstructive pulmonary disease) ([place])   . Thrombocytopenia ([place])   . MVP (mitral valve prolapse)   . SVT (supraventricular tachycardia) ([place])     PAST SURGICAL HISTORY: Past Surgical History  Procedure Laterality Date  . Bil roken wrists    . Gallbladder removed    . Bilateral bunionectomies    . Cesarean section    . Abdominal hysterectomy    . Cholecystectomy      FAMILY HISTORY Family History  Problem Relation Age of Onset  . Ovarian cancer Maternal Grandmother 85  . Breast cancer Maternal Aunt 29       ADVANCED DIRECTIVES:     HEALTH MAINTENANCE: Social History  Substance Use Topics  . Smoking status: Former Smoker    Quit date: 06/12/1989  . Smokeless tobacco: Never Used  . Alcohol Use: No     Colonoscopy:  PAP:  Bone density:  Lipid panel:  Allergies  Allergen Reactions  . Aspirin     Other reaction(s): Localized superficial swelling of skin uncoated asa-causes throat to swell up  . Codeine Itching    Other reaction(s): Itching of Skin  . Fosamax  [Alendronate Sodium]     Other reaction(s): Distress (finding)  . Ibuprofen   . Ibuprofen     Other reaction(s): Distress (finding)  . Metronidazole Nausea And Vomiting  . Naproxen     Other reaction(s): Distress (finding)  . Naproxen Sodium   . Nsaids     GI UPSET / GERD  . Oxycodone     Other reaction(s): Diarrhea and vomiting (finding)  . Oxycodone-Acetaminophen     Other reaction(s): Diarrhea and vomiting (finding)    Current Facility-Administered Medications  Medication Dose Route Frequency Provider Last Rate Last Dose  . 0.9 %  sodium chloride infusion   Intravenous Continuous Juluis Mire, MD 100 mL/hr at 05/31/15 1249    . acetaminophen (TYLENOL) tablet 1,000 mg  1,000 mg Oral Q6H PRN Lenis Noon, RPH      . acidophilus (RISAQUAD) capsule 1 capsule  1 capsule Oral Daily Darylene Price [place], [facility]  1 capsule at 05/30/15 1010  . albuterol (PROVENTIL) (2.5 MG/3ML) 0.083% nebulizer solution 2.5 mg  2.5 mg Nebulization Q4H PRN Juluis Mire, MD      . calcium-vitamin D (OSCAL WITH D) 500-200 MG-UNIT per tablet 1 tablet  1 tablet Oral BID Lenis Noon, [facility]   1 tablet at 05/30/15 2157  . ceFAZolin (ANCEF) IVPB 1 g/50 mL premix  1 g Intravenous 3 times per day Hessie Knows, MD      . dexamethasone (DECADRON) tablet 4 mg  4 mg Oral Daily Juluis Mire, MD   4 mg at 05/30/15 1009  . feeding supplement (ENSURE ENLIVE) (ENSURE ENLIVE) liquid 237 mL  237 mL Oral BID BM Henreitta Leber, MD      . furosemide (LASIX) tablet 20 mg  20 mg Oral  Daily Juluis Mire, MD   20 mg at 05/30/15 1011  . heparin ADULT infusion 100 units/mL (25000 units/250 mL)  700 Units/hr Intravenous Continuous Henreitta Leber, MD 8 mL/hr at 05/31/15 0248 800 Units/hr at 05/31/15 0248  . morphine 2 MG/ML injection 2 mg  2 mg Intravenous Q4H PRN Juluis Mire, MD   2 mg at 05/31/15 1214  . multivitamin with minerals tablet 1 tablet  1 tablet Oral Daily Juluis Mire, MD   1 tablet at 05/30/15 1011  . nystatin (MYCOSTATIN) 100000 UNIT/ML suspension 500,000 Units  5 mL Oral QID Henreitta Leber, MD   500,000 Units at 05/30/15 1800  . ondansetron (ZOFRAN) tablet 4 mg  4 mg Oral Q6H PRN Juluis Mire, MD       Or  . ondansetron [facility]) injection 4 mg  4 mg Intravenous Q6H PRN Juluis Mire, MD      . pantoprazole (PROTONIX) EC tablet 40 mg  40 mg Oral Daily Juluis Mire, MD   40 mg at 05/30/15 1011  . potassium chloride SA (K-DUR,KLOR-CON) CR tablet 20 mEq  20 mEq Oral BID Juluis Mire, MD   20 mEq at 05/30/15 2156  . sodium chloride 0.9 % injection 3 mL  3 mL Intravenous Q12H Juluis Mire, MD   3 mL at 05/29/15 2215  . topiramate (TOPAMAX) tablet 25 mg  25 mg Oral Daily Lenis Noon, RPH   25 mg at 05/30/15 2159  . traMADol (ULTRAM) tablet 50 mg  50 mg Oral Q6H PRN Juluis Mire, MD      . tranexamic acid (CYKLOKAPRON) 1,000 mg in sodium chloride 0.9 % 100 mL IVPB  1,000 mg Intravenous To OR Hessie Knows, MD      . vancomycin (VANCOCIN) IVPB 1000 mg/200 mL premix  1,000 mg Intravenous Q24H Lenis Noon, RPH   1,000 mg at 05/31/15 0123  . zolpidem (AMBIEN) tablet 5 mg  5 mg Oral QHS PRN Juluis Mire, MD   5 mg at 05/30/15 2158   Facility-Administered Medications Ordered in Other Encounters  Medication Dose Route Frequency Provider Last Rate Last Dose  . 0.9 %  sodium chloride infusion   Intravenous Continuous Lloyd Huger, MD   Stopped at 05/18/15 1252  . 0.9 %  sodium chloride infusion   Intravenous Continuous Lloyd Huger, MD        OBJECTIVE: Filed Vitals:   05/30/15 2110 05/31/15 0544  BP: 107/51 117/84  Pulse: 91 95  Temp: 98.8 F (37.1 C) 98.4 F (36.9 C)  Resp:       Body mass  index is 25.84 kg/(m^2).    ECOG FS:3 - Symptomatic, >50% confined to bed  General: Well-developed, well-nourished, no acute distress. Eyes: Pink conjunctiva, anicteric sclera. Lungs: Clear to auscultation bilaterally. Heart: Regular rate and rhythm. No rubs, murmurs, or gallops. Abdomen: Soft, nontender, nondistended. No organomegaly noted, normoactive bowel sounds. Musculoskeletal: No edema, cyanosis, or clubbing. Neuro: Alert, answering all questions appropriately. Cranial nerves grossly intact. Skin: Significant right lower extremity erythema and warmth. Psych: Normal affect. Marland Kitchen   LAB RESULTS:  Lab Results  Component Value Date   NA 134* 05/30/2015   K 3.2* 05/30/2015   CL 103 05/30/2015   CO2 24 05/30/2015   GLUCOSE 105* 05/30/2015   BUN 16 05/30/2015   CREATININE 0.66 05/30/2015   CALCIUM 7.9* 05/30/2015   PROT 5.7* 05/29/2015   ALBUMIN 3.0* 05/29/2015   AST 33 05/29/2015   ALT 32 05/29/2015   ALKPHOS 65 05/29/2015   BILITOT 1.3* 05/29/2015   GFRNONAA >60 05/30/2015   GFRAA >60 05/30/2015    Lab Results  Component Value Date   WBC 4.1 05/31/2015   NEUTROABS 3.5 05/29/2015   HGB 9.3* 05/31/2015   HCT 27.1* 05/31/2015   MCV 102.3* 05/31/2015   PLT 73* 05/31/2015     STUDIES: Ct Angio Chest Pe W/cm &/or Wo Cm  05/29/2015  CLINICAL DATA:  Back pain for 2 weeks EXAM: CT ANGIOGRAPHY CHEST, ABDOMEN AND PELVIS TECHNIQUE: Multidetector CT imaging through the chest, abdomen and pelvis was performed using the standard protocol during bolus administration of intravenous contrast. Multiplanar reconstructed images and MIPs were obtained and reviewed to evaluate the vascular anatomy. CONTRAST:  61mL OMNIPAQUE IOHEXOL 350 MG/ML SOLN COMPARISON:  03/03/2015. FINDINGS: CTA CHEST FINDINGS Lungs are  well aerated bilaterally. Biapical subpleural and parenchymal scarring is noted. No sizable infiltrate is seen. Mild scarring is noted inferiorly in the right lung as well. The thoracic inlet is within normal limits. The thoracic aorta and its branches show mild atherosclerotic change. No aneurysmal dilatation or dissection is identified. The pulmonary artery as visualized is within normal limits. No significant hilar or mediastinal adenopathy is noted. No acute bony abnormality is noted. Review of the MIP images confirms the above findings. CTA ABDOMEN AND PELVIS FINDINGS The gallbladder has been surgically removed. The liver is diffusely decreased in attenuation consistent with fatty infiltration. The spleen, adrenal glands and pancreas are within normal limits. Kidneys are well visualized without evidence renal calculi or urinary tract obstructive change. A left retro aortic renal vein is noted. The urinary bladder is within normal limits. Mild diverticular change is noted without evidence of diverticulitis. The appendix is not well seen. No inflammatory changes are noted. The uterus has been surgically removed. The abdominal aorta shows a normal branching pattern. Mild aortic calcifications are seen. No aneurysm or dissection is identified. The osseous structures show compression deformity of the superior endplate of L2 and inferior endplate of L4. These changes are new from the prior exam. Review of the MIP images confirms the above findings. IMPRESSION: No evidence of dissection or aneurysmal dilatation. No evidence of pulmonary embolism. L2 and L4 compression deformities as described which are new from September of 2016. They may be related to the patient's recent injury. Electronically Signed   By: Inez Catalina M.D.   On: 05/29/2015 14:22   Mr Lumbar Spine W Wo Contrast  05/30/2015  CLINICAL DATA:  Low back pain following twisting injury approximately 2 weeks ago. New fractures at L2 and L4 on recent  PET-CT.  History of metastatic ovarian cancer. EXAM: MRI LUMBAR SPINE WITHOUT AND WITH CONTRAST TECHNIQUE: Multiplanar and multiecho pulse sequences of the lumbar spine were obtained without and with intravenous contrast. CONTRAST:  47mL MULTIHANCE GADOBENATE DIMEGLUMINE 529 MG/ML IV SOLN COMPARISON:  PET-CT 03/03/2015. Abdominal pelvic CT 05/25/2014 and 05/29/2015. FINDINGS: Segmentation: Conventional anatomy assumed, with the last open disc space designated L5-S1. Alignment:  Normal. Bones: As demonstrated on recent CT, there are fractures at L2 and L4 which are new from the PET-CT of September. At L2, there is a superior endplate compression fracture resulting in approximately 20% loss vertebral body height and 6 mm of osseous retropulsion. At L4, there is an inferior endplate compression deformity resulting in less than 20% loss of vertebral body height centrally and mild osseous retropulsion. Both fractures demonstrate mild adjacent marrow edema and enhancement. Neither fracture demonstrates any pathologic features or associated epidural hematoma. There are no osseous findings worrisome for metastatic disease. There is a 5 mm lesion in the left L4 pedicle which demonstrates increased T1 and T2 signal, probably a small hemangioma. This is not clearly seen on older or recent CTs. Conus medullaris: Extends to the L1-2 level and appears normal. No abnormal intradural enhancement. Paraspinal and other soft tissues: There is a small amount of paraspinal edema adjacent to both fractures. The visualized retroperitoneum appears grossly stable with small lymph nodes. There is a possible small right-sided retrocrural lymphocele, measuring 11 mm on axial image 3 of series 5. Disc levels: L1-2: The disc appears normal. There is mild osseous retropulsion related to the L2 fracture. No foraminal compromise or nerve root encroachment. L2-3: Mild disc desiccation with preserved height. Mild bilateral facet hypertrophy. No spinal  stenosis or nerve root encroachment. L3-4: Mild disc desiccation with preserved height. Mild bilateral facet hypertrophy. No significant spinal stenosis or nerve root encroachment. L4-5: Disc bulging, facet and ligamentous hypertrophy superimposed on short pedicles result in mild spinal stenosis. There is minimal osseous retropulsion related to the L4 fracture. No evidence of nerve root encroachment. L5-S1: Chronic degenerative disc disease with loss of disc height, paraspinal osteophytes and vacuum phenomenon. No significant spinal stenosis or nerve root encroachment. IMPRESSION: 1. The recently demonstrated fractures at L2 and L4 are unchanged from CT of yesterday. These are consistent with benign/posttraumatic fractures and appear acute/subacute in age. There is mild associated osseous retropulsion and paraspinal hemorrhage. 2. No evidence of metastatic disease or pathologic fracture. 3. Mild spondylosis, greatest at L5-S1. No evidence of nerve root encroachment Electronically Signed   By: Richardean Sale M.D.   On: 05/30/2015 13:06   Ct Cta Abd/pel W/cm &/or W/o Cm  05/29/2015  CLINICAL DATA:  Back pain for 2 weeks EXAM: CT ANGIOGRAPHY CHEST, ABDOMEN AND PELVIS TECHNIQUE: Multidetector CT imaging through the chest, abdomen and pelvis was performed using the standard protocol during bolus administration of intravenous contrast. Multiplanar reconstructed images and MIPs were obtained and reviewed to evaluate the vascular anatomy. CONTRAST:  12mL OMNIPAQUE IOHEXOL 350 MG/ML SOLN COMPARISON:  03/03/2015. FINDINGS: CTA CHEST FINDINGS Lungs are well aerated bilaterally. Biapical subpleural and parenchymal scarring is noted. No sizable infiltrate is seen. Mild scarring is noted inferiorly in the right lung as well. The thoracic inlet is within normal limits. The thoracic aorta and its branches show mild atherosclerotic change. No aneurysmal dilatation or dissection is identified. The pulmonary artery as visualized  is within normal limits. No significant hilar or mediastinal adenopathy is noted. No acute bony abnormality is noted. Review of the MIP images  confirms the above findings. CTA ABDOMEN AND PELVIS FINDINGS The gallbladder has been surgically removed. The liver is diffusely decreased in attenuation consistent with fatty infiltration. The spleen, adrenal glands and pancreas are within normal limits. Kidneys are well visualized without evidence renal calculi or urinary tract obstructive change. A left retro aortic renal vein is noted. The urinary bladder is within normal limits. Mild diverticular change is noted without evidence of diverticulitis. The appendix is not well seen. No inflammatory changes are noted. The uterus has been surgically removed. The abdominal aorta shows a normal branching pattern. Mild aortic calcifications are seen. No aneurysm or dissection is identified. The osseous structures show compression deformity of the superior endplate of L2 and inferior endplate of L4. These changes are new from the prior exam. Review of the MIP images confirms the above findings. IMPRESSION: No evidence of dissection or aneurysmal dilatation. No evidence of pulmonary embolism. L2 and L4 compression deformities as described which are new from September of 2016. They may be related to the patient's recent injury. Electronically Signed   By: Inez Catalina M.D.   On: 05/29/2015 14:22    ASSESSMENT: Stage IV ovarian cancer, compression fractures, cellulitis.  PLAN:    1. Hematology/Oncology: Stage IV ovarian cancer. Patient last received chemotherapy approximately one week ago. Chemotherapy is currently on hold for 4-6 weeks. Patient has been instructed to keep her previously scheduled appointment for repeat imaging and further evaluation in January. 2. Back pain: L2 and L4 compression fractures. Case discussed with orthopedics. Plan for kyphoplasty this afternoon. Eliquis and heparin are on hold. Monitor for  bleeding closely with thrombocytopenia. 3. Thrombocytopenia: Secondary to chemotherapy, monitor closely with kyphoplasty as above. 4. History of PE: Patient was continued on Eliquis prophylactically given her high risk of blood clot. Currently it is on hold for her procedure, recommend restarting in 2-3 days at same dose.  5. Infectious disease: Enterococcus bacteremia on vancomycin. Sensitivities pending. Blood cultures q 24 hours prn temp > 100.4 to ensure blood cultures clear.  6. Thrush: Have ordered oral fluconazole.      Will follow.   Ovarian cancer [facility])   Staging form: Ovary, AJCC 7th Edition     Clinical stage from 11/12/2014: Stage IV (TX, N0, M1) - Signed by Lloyd Huger, MD on 11/12/2014   Lloyd Huger, MD   05/31/2015 2:16 PM

## 2015-05-31 NOTE — Progress Notes (Signed)
Physical Therapy Treatment Patient Details Name: CHIVONNE HEIDE MRN: [id number] DOB: [birth year] Today's Date: 05/31/2015    History of Present Illness Surai Pless is a 74 y.o. 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 Patient states when she was trying to lift some weight 2 weeks ago she developed back pain which continued since then and worsened today. Denies any focal weakness or numbness, bladder or bowel disturbances. Denies any fever, chills, chest pain, nausea, vomiting, diarrhea, dysuria, abdominal pain. On arrival to the ED, patient was noted to be hypotensive with blood pressure 79/45 and tachycardic with heart rate of 115 and temperature of 97 degrees Fahrenheit. She was also noted to have redness and swelling of right lower extremity with increased local warmth. Lab work revealed WBC of 3.9, potassium 3.4 and lactate 3.1 and platelets 92K which is chronic. Urinalysis WBC 6-30 with trace LE. CT of the chest abdomen and the pelvis reported negative for PE/ infilltrate, acute intra-abdominal pathology but L2 and L4 compression fractures. She was given 2 L of IV fluids following which her blood pressure improved and currently is maintaining around 122/65. Heart rate also improved and currently maintaining running around 100-105. After obtaining blood cultures, patient was started on IV vancomycin with the diagnosis of sepsis due to cellulitis and hospitalist service was consulted for further management. Ortho consulted and is considering kyphoplasty pending input from oncology. Prior to injury pt reports independence with ADLs but assist for IADLs. Pt uses a rollator for mobility and denies falls in the last 12 months.     PT Comments    Pt currently awaiting surgical procedure (kyphoplasty). Pt notes back pain is an 8/10 at rest and  increases with stand/ambulation. Pt needs to use the bathroom and agreeable to ambulate to/from. Pt did not wish further ambulation at this time due to pain. Pt returned to bed. Reviewed log roll technique with supine to/from sit with pt and educated family members. Pt demonstrates good understanding. Continue PT for progression of strength, endurance and ambulation to improve functional mobility.   Follow Up Recommendations  Home health PT;Supervision - Intermittent     Equipment Recommendations  None recommended by PT    Recommendations for Other Services       Precautions / Restrictions Precautions Precautions: Fall Restrictions Weight Bearing Restrictions: No    Mobility  Bed Mobility Overal bed mobility: Needs Assistance Bed Mobility: Supine to Sit;Sit to Supine     Supine to sit: Min assist Sit to supine: Min assist   General bed mobility comments: Demonstrates good log roll technique; Min A for LEs, increased time  Transfers Overall transfer level: Needs assistance Equipment used: Rolling walker (2 wheeled) Transfers: Sit to/from Stand Sit to Stand: Min guard         General transfer comment: Demonstrates good safety/hand placement. slow/guarded movement  Ambulation/Gait Ambulation/Gait assistance: Min guard Ambulation Distance (Feet): 25 Feet (performed 2x) Assistive device: Rolling walker (2 wheeled) Gait Pattern/deviations: Step-through pattern;Trunk flexed Gait velocity: Decreased Gait velocity interpretation: Below normal speed for age/gender General Gait Details: slow/cautious ambulation; guarded movement. Steady   Stairs            Wheelchair Mobility    Modified Rankin (Stroke Patients Only)       Balance           Standing balance support: Bilateral upper  extremity supported Standing balance-Leahy Scale: Fair                      Cognition Arousal/Alertness: Awake/alert Behavior During Therapy: WFL for tasks  assessed/performed Overall Cognitive Status: Within Functional Limits for tasks assessed                      Exercises      General Comments        Pertinent Vitals/Pain Pain Assessment: 0-10 Pain Score: 8  Pain Location: Mid to low back Pain Descriptors / Indicators: Constant (worsens with stand) Pain Intervention(s): Limited activity within patient's tolerance    Home Living                      Prior Function            PT Goals (current goals can now be found in the care plan section) Progress towards PT goals: Progressing toward goals    Frequency  7X/week    PT Plan Current plan remains appropriate    Co-evaluation             End of Session Equipment Utilized During Treatment: Gait belt Activity Tolerance: Patient tolerated treatment well Patient left: in bed;with call bell/phone within reach;with bed alarm set;with family/visitor present     Time: [id number] PT Time Calculation (min) (ACUTE ONLY): 21 min  Charges:  $Gait Training: 8-22 mins                    G Codes:      Charlaine Dalton 05/31/2015, 3:38 PM

## 2015-05-31 NOTE — Progress Notes (Signed)
Spoke with Dr. Rudene Christians about critical aptt value of >160.  He will be consulting Dr. Grayland Ormond this am about whether to proceed with kyphoplasty.  No orders at this time.

## 2015-05-31 NOTE — Care Management Note (Signed)
Case Management Note  Patient Details  Name: [name] MRN: [id number] Date of Birth: Feb 26, 1941  Subjective/Objective:                  Met with patient and her husband to discuss discharge planning. [facility] and would like same PT person she had in the past. She has a rollator that she ambulates with. Her PCP is Dr. Ronette Deter with [facility] and has a follow up on 12/23. She also sees Dr. Grayland Ormond. She has competed her second round of chemo and will take a month break per husband. She states she has a scheduled CT scan but husband said its a PET scan. She uses [facility] on [address] for Rx. She denies difficulty obtaining Rx.   Action/Plan: Referral made to [facility] with [place] for home health services. RNCM will continue to follow.   Expected Discharge Date:                  Expected Discharge Plan:     In-House Referral:     Discharge planning Services     Post Acute Care Choice:  Home Health Choice offered to:  Patient, Spouse  DME Arranged:    DME Agency:     HH Arranged:  PT [place]:  [place]  Status of Service:     Medicare Important Message Given:  Yes Date Medicare IM Given:    Medicare IM give by:    Date Additional Medicare IM Given:    Additional Medicare Important Message give by:     If discussed at [place] of Stay Meetings, dates discussed:    Additional Comments:  Marshell Garfinkel, RN 05/31/2015, 11:42 AM

## 2015-05-31 NOTE — Consult Note (Addendum)
ANTICOAGULATION CONSULT NOTE - Initial Consult  Pharmacy Consult for Heparin Indication: pulmonary embolus  Allergies  Allergen Reactions  . Aspirin     Other reaction(s): Localized superficial swelling of skin uncoated asa-causes throat to swell up  . Codeine Itching    Other reaction(s): Itching of Skin  . Fosamax  [Alendronate Sodium]     Other reaction(s): Distress (finding)  . Ibuprofen   . Ibuprofen     Other reaction(s): Distress (finding)  . Metronidazole Nausea And Vomiting  . Naproxen     Other reaction(s): Distress (finding)  . Naproxen Sodium   . Nsaids     GI UPSET / GERD  . Oxycodone     Other reaction(s): Diarrhea and vomiting (finding)  . Oxycodone-Acetaminophen     Other reaction(s): Diarrhea and vomiting (finding)    Patient Measurements: Height: 5\' 6"  (167.6 cm) Weight: 129 lb 12.8 oz (58.877 kg) IBW/kg (Calculated) : 59.3 Heparin Dosing Weight: 58.9kg  Vital Signs: Temp: 98.8 F (37.1 C) (12/18 2110) Temp Source: Oral (12/18 2110) BP: 107/51 mmHg (12/18 2110) Pulse Rate: 91 (12/18 2110)  Labs:  Recent Labs  05/29/15 1149 05/30/15 0430 05/30/15 2319  HGB 11.7* 10.2*  --   HCT 33.9* 29.1*  --   PLT 92* 83*  --   APTT  --   --  >160*  LABPROT  --   --  18.6*  INR  --   --  1.54  HEPARINUNFRC  --   --  1.93*  CREATININE 0.91 0.66  --     Estimated Creatinine Clearance: 59.1 mL/min (by C-G formula based on Cr of 0.66).   Medical History: Past Medical History  Diagnosis Date  . 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 Irritable bowel   . C. difficile colitis 2011  . Allergic rhinitis     Followed by Dr. Donneta Romberg  . Esophageal reflux     Followed by GI physician, on Dexilant and Ranitidine  . Migraine   . 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 Osteoporosis 2009    on Reclast once yearly, Followed by Dr. Jefm Bryant  . Ovarian cancer ([place])   . COPD (chronic obstructive pulmonary disease) ([place])   .  Thrombocytopenia ([place])   . MVP (mitral valve prolapse)   . SVT (supraventricular tachycardia) ([facility])     Medications:  Scheduled:  . acidophilus  1 capsule Oral Daily  . calcium-vitamin D  1 tablet Oral BID  . dexamethasone  4 mg Oral Daily  . furosemide  20 mg Oral Daily  . multivitamin with minerals  1 tablet Oral Daily  . nystatin  5 mL Oral QID  . pantoprazole  40 mg Oral Daily  . potassium chloride SA  20 mEq Oral BID  . sodium chloride  3 mL Intravenous Q12H  . topiramate  25 mg Oral Daily  . vancomycin  1,000 mg Intravenous Q24H   Infusions:  . sodium chloride 100 mL/hr at 05/31/15 0120  . heparin      Assessment: Amanda Robbins is a 74 yo female admitted for cellulitis found to have PE. Pharmacy consulted to dose heparin in this patient.  Goal of Therapy:  Heparin level 0.3-0.7 units/ml Monitor platelets by anticoagulation protocol: Yes   Plan:  Give 3500 units bolus x 1 Start heparin infusion at 1000 units/hr Check anti-Xa level in 6 hours and daily while on heparin Continue to monitor H&H and platelets   Will adjust 12/18  2100 heparin level accordingly.  Of note, patient's platelets are low at 83. Patient is receiving treatment for metastatic ovarian cancer with brain mets. Spoke to Dr. Verdell Carmine, he is aware.   Patient on apixaban outpatient due to history of PE. Heparin drip started >12 hours after last apixaban dose, however baseline APTT and INR labs were not ordered appropriately. Will be checking INR and APTT with 12/18 2100 heparin level and will adjust heparin dose accordingly.  12/19 2300 anti-Xa 1.93, aPTT >160. Hold infusion x 1 hour and restart at 800 units/hr. Recheck heparin level, CBC, aPTT 6 hours after restart of infusion.  Pharmacy will continue to monitor.  Roe Coombs, PharmD Pharmacy Resident 05/31/2015

## 2015-05-31 NOTE — Progress Notes (Signed)
VSS, Heparin drip adjusted to 90ml/hr after critical labs came back. Pt NPO since midnight for possible kyphoplasty. Pain meds given 1x for lower back pain. Pt resting comfortably.

## 2015-05-31 NOTE — Op Note (Signed)
05/29/2015 - 05/31/2015  6:55 PM  PATIENT:  Amanda Robbins  74 y.o. female  PRE-OPERATIVE DIAGNOSIS:  Lumbar compression fractures L-2 and L-4  POST-OPERATIVE DIAGNOSIS:  Lumbar compression fractures L-2 and L-4  PROCEDURE:  Procedure(s): KYPHOPLASTY L-2 and L-4 (N/A)  SURGEON: Laurene Footman, MD  ASSISTANTS: None  ANESTHESIA:   local and MAC  EBL:  Total I/O In: -  Out: 250 [Urine:250]  BLOOD ADMINISTERED:none  DRAINS: none   LOCAL MEDICATIONS USED:  MARCAINE    and XYLOCAINE   SPECIMEN:  Source of Specimen:  L2 and L4 vertebral body  DISPOSITION OF SPECIMEN:  PATHOLOGY  COUNTS:  YES  TOURNIQUET:  * No tourniquets in log *  IMPLANTS: Bone cement  DICTATION: .Dragon Dictation patient brought the operating room and after adequate sedation was given, the patient was placed prone. C-arm was brought in and good visualization of L-2 and L4 were obtained. Patient identification timeout procedures were completed and local anesthetic was infiltrated after prepping the skin with alcohol near the planned incisions. Next the back was prepped and draped in sterile fashion and repeat timeout procedure carried out spinal needle was then used to inject a mixture of half percent Sensorcaine with epinephrine and 1% Xylocaine from the pedicle to the skin at along with this dilute TXAWas infiltrated in the soft tissues on the right side as well. Small incision made none procedure carried out at L2 then  an identical procedure at L4. The trochars advanced to the next pedicular approach and the body was entered after being care that the trocar stayed within the pedicle not going into the neural foramen or the spinal canal. Biopsy was obtained and then drilling carried out followed by placement of a balloon. The balloon was inflated to 4 cc. After both L-2 and L4 had been addressed in this fashion from the right side up cement was mixed and was the appropriate consistency proximally 40 have cc was  infiltrated into L-2 and about 5 cc and L4 giving good fill across the midline. The trochars removed and permanent C-arm views obtained additional dilute TXA was infiltrated along the tract followed by closure the wound with Dermabond and Band-Aids   PLAN OF CARE: Continue as an inpatient  PATIENT DISPOSITION:  PACU - hemodynamically stable.

## 2015-05-31 NOTE — Transfer of Care (Signed)
Immediate Anesthesia Transfer of Care Note  Patient: Amanda Robbins  Procedure(s) Performed: Procedure(s): KYPHOPLASTY L-2 and L-4 (N/A)  Patient Location: PACU  Anesthesia Type:MAC  Level of Consciousness: awake, alert  and oriented  Airway & Oxygen Therapy: Patient Spontanous Breathing and Patient connected to nasal cannula oxygen  Post-op Assessment: Report given to RN and Post -op Vital signs reviewed and stable  Post vital signs: Reviewed and stable  Last Vitals:  Filed Vitals:   05/31/15 1859 05/31/15 1900  BP: 108/62 108/62  Pulse:  93  Temp: 37.2 C   Resp: 16 15    Complications: No apparent anesthesia complications

## 2015-05-31 NOTE — Anesthesia Postprocedure Evaluation (Signed)
Anesthesia Post Note  Patient: Amanda Robbins  Procedure(s) Performed: Procedure(s) (LRB): KYPHOPLASTY L-2 and L-4 (N/A)  Patient location during evaluation: PACU Anesthesia Type: General Level of consciousness: awake Pain management: satisfactory to patient Vital Signs Assessment: post-procedure vital signs reviewed and stable Respiratory status: spontaneous breathing Cardiovascular status: stable Anesthetic complications: no    Last Vitals:  Filed Vitals:   05/31/15 1910 05/31/15 1915  BP:  112/66  Pulse: 86 90  Temp:    Resp: 10 11    Last Pain:  Filed Vitals:   05/31/15 1919  PainSc: 6                  VAN STAVEREN,Tai Skelly

## 2015-06-01 ENCOUNTER — Encounter: Payer: Self-pay | Admitting: Orthopedic Surgery

## 2015-06-01 LAB — CBC
HCT: 27.1 % — ABNORMAL LOW (ref 35.0–47.0)
Hemoglobin: 9.5 g/dL — ABNORMAL LOW (ref 12.0–16.0)
MCH: 35.8 pg — ABNORMAL HIGH (ref 26.0–34.0)
MCHC: 35.1 g/dL (ref 32.0–36.0)
MCV: 102.1 fL — ABNORMAL HIGH (ref 80.0–100.0)
Platelets: 75 10*3/uL — ABNORMAL LOW (ref 150–440)
RBC: 2.66 MIL/uL — ABNORMAL LOW (ref 3.80–5.20)
RDW: 15.3 % — AB (ref 11.5–14.5)
WBC: 3.4 10*3/uL — ABNORMAL LOW (ref 3.6–11.0)

## 2015-06-01 LAB — CULTURE, BLOOD (ROUTINE X 2)

## 2015-06-01 LAB — VANCOMYCIN, TROUGH: VANCOMYCIN TR: 9 ug/mL — AB (ref 10–20)

## 2015-06-01 MED ORDER — TRAMADOL HCL 50 MG PO TABS: 50.0000 mg | ORAL_TABLET | Freq: Four times a day (QID) | ORAL | Status: DC | PRN

## 2015-06-01 NOTE — Progress Notes (Signed)
Received pt from PACU nurse.  Pt reported to not need pain meds post surgery.  Pt requested morphine upon arrival to room.  She ambulated to the bathroom with slow, sure footing.  Left eye appears to have ruptured blood vessels.  This was not present before surgery.

## 2015-06-01 NOTE — Progress Notes (Signed)
[facility] [place] NAME: Amanda Robbins    MR#:  [id number]  DATE OF BIRTH:  Sep 21, 1940  SUBJECTIVE:   Patient here due to back pain and noted to have a compression fracture at L2-L3 level.  Status post kyphoplasty postop day #1. Still having some back pain but improved. Afebrile, right lower extremity looks less red.  REVIEW OF SYSTEMS:    Review of Systems  Constitutional: Positive for malaise/fatigue. Negative for fever and chills.  HENT: Negative for congestion and tinnitus.   Eyes: Negative for blurred vision and double vision.  Respiratory: Negative for cough, shortness of breath and wheezing.   Cardiovascular: Negative for chest pain, orthopnea and PND.  Gastrointestinal: Negative for nausea, vomiting, abdominal pain and diarrhea.  Genitourinary: Negative for dysuria and hematuria.  Musculoskeletal: Positive for back pain.  Neurological: Positive for weakness. Negative for dizziness, sensory change and focal weakness.  All other systems reviewed and are negative.   Nutrition: Regular Tolerating Diet: Yes Tolerating PT: Evaluation noted.   DRUG ALLERGIES:   Allergies  Allergen Reactions  . Aspirin     Other reaction(s): Localized superficial swelling of skin uncoated asa-causes throat to swell up  . Codeine Itching    Other reaction(s): Itching of Skin  . Fosamax  [Alendronate Sodium]     Other reaction(s): Distress (finding)  . Ibuprofen   . Ibuprofen     Other reaction(s): Distress (finding)  . Metronidazole Nausea And Vomiting  . Naproxen     Other reaction(s): Distress (finding)  . Naproxen Sodium   . Nsaids     GI UPSET / GERD  . Oxycodone     Other reaction(s): Diarrhea and vomiting (finding)  . Oxycodone-Acetaminophen     Other reaction(s): Diarrhea and vomiting (finding)    VITALS:  Blood pressure 102/56, pulse 117, temperature 98 F (36.7 C), temperature source Oral, resp. rate 18, height _0   (1.676 m), weight 75.66 kg (166 lb 12.8 oz), SpO2 100 %.  PHYSICAL EXAMINATION:   Physical Exam  GENERAL:  74 y.o.-year-old patient lying in the bed with no acute distress.  EYES: Pupils equal, round, reactive to light and accommodation. No scleral icterus. Extraocular muscles intact.  HEENT: Head atraumatic, normocephalic. Oropharynx and nasopharynx clear.  NECK:  Supple, no jugular venous distention. No thyroid enlargement, no tenderness.  LUNGS: Normal breath sounds bilaterally, no wheezing, rales, rhonchi. No use of accessory muscles of respiration.  CARDIOVASCULAR: S1, S2 normal. No murmurs, rubs, or gallops.  ABDOMEN: Soft, nontender, nondistended. Bowel sounds present. No organomegaly or mass.  EXTREMITIES: No cyanosis, clubbing or edema b/l.  RLE redness and warmth consistent w/ cellulitis.  NEUROLOGIC: Cranial nerves II through XII are intact. No focal Motor or sensory deficits b/l.   PSYCHIATRIC: The patient is alert and oriented x 3. Good affect.   SKIN: No obvious rash, lesion, or ulcer. RLE cellulitis.    LABORATORY PANEL:   CBC  Recent Labs Lab 06/01/15 0543  WBC 3.4*  HGB 9.5*  HCT 27.1*  PLT 75*   ------------------------------------------------------------------------------------------------------------------  Chemistries   Recent Labs Lab 05/29/15 1149 05/30/15 0430  NA 134* 134*  K 3.4* 3.2*  CL 103 103  CO2 26 24  GLUCOSE 95 105*  BUN 23* 16  CREATININE 0.91 0.66  CALCIUM 8.8* 7.9*  AST 33  --   ALT 32  --   ALKPHOS 65  --   BILITOT 1.3*  --    ------------------------------------------------------------------------------------------------------------------  Cardiac Enzymes No results for input(s): TROPONINI in the last 168 hours. ------------------------------------------------------------------------------------------------------------------  RADIOLOGY:  Dg Lumbar Spine 2-3 Views  05/31/2015  CLINICAL DATA:  Two level kyphoplasty.  EXAM: LUMBAR SPINE - 2-3 VIEW; DG C-ARM 61-120 MIN COMPARISON:  MRI 05/30/2015 FINDINGS: Examination demonstrates normal vertebral body alignment. There is evidence of patient's recent kyphoplasty of L2 and L4. Injected radiopaque material remains within the confines of the vertebral body borders. Recommend correlation with findings at the time of the procedure. IMPRESSION: Post kyphoplasty of L2 and L4. Electronically Signed   By: Marin Olp M.D.   On: 05/31/2015 19:27   Dg C-arm 1-60 Min  05/31/2015  CLINICAL DATA:  Two level kyphoplasty. EXAM: LUMBAR SPINE - 2-3 VIEW; DG C-ARM 61-120 MIN COMPARISON:  MRI 05/30/2015 FINDINGS: Examination demonstrates normal vertebral body alignment. There is evidence of patient's recent kyphoplasty of L2 and L4. Injected radiopaque material remains within the confines of the vertebral body borders. Recommend correlation with findings at the time of the procedure. IMPRESSION: Post kyphoplasty of L2 and L4. Electronically Signed   By: Marin Olp M.D.   On: 05/31/2015 19:27     ASSESSMENT AND PLAN:   74 year old female with past medical history of metastatic ovarian cancer, chronic anemia/thrombocytopenia, GERD, COPD, history of SVT, history of mitral valve prolapse, who presented to the hospital due to back pain and also noted to have sepsis secondary cellulitis.  #1 back pain-this is secondary to a compression fracture in the L2-L4 area. -Status post kyphoplasty postop day #1. Continue morphine, tramadol for pain control. -Appreciate orthopedics input, had PT eval today and will need home health upon discharge.   #2 sepsis-patient met criteria on admission given her hypotension/tachycardia and elevated lactic acid. -Patient's urine culture and blood cultures are positive for enterococcus. - off vancomycin and switched to oral amoxicillin and doxycycline today.  - repeat BC from 12/19 are (-) so far.   #3 right lower extremity cellulitis - was on IV Vanco  but switched to Oral Doxycycline.  Clinically improving.   #4 history of PE-patient is on Eliquis and it's on hold as pt had surgery yesterday.  - will resume in a.m. If o.k w/ Ortho.   #5 chronic anemia/thrombocytopenia-due to underlying malignancy and patient getting chemotherapy. -Continue to follow counts. No acute need for transfusion presently.  #6 Ovarian Cancer w/ mets - appreciate oncology input and cont. Care as per them.  - cont. Decadron.    #7 GERD - cont. Protonix.     All the records are reviewed and case discussed with Care Management/Social Workerr. Management plans discussed with the patient, family and they are in agreement.  CODE STATUS: Full  DVT Prophylaxis: Heparin drip  TOTAL TIME TAKING CARE OF THIS PATIENT: 30 minutes.   POSSIBLE D/C IN 1-2 DAYS, DEPENDING ON CLINICAL CONDITION.   Henreitta Leber M.D on 06/01/2015 at 2:55 PM  Between 7am to 6pm - Pager - [phone]  After 6pm go to [email or web address] - password [email or web address] [facility]  Office  [phone]  CC: Primary care physician; Rica Mast, MD

## 2015-06-01 NOTE — Progress Notes (Signed)
[place]  [phone] [phone]  ID: Amanda Robbins OB: Nov 30, 1940  MR#: [id number]  [id number]  Patient Care Team: Jackolyn Confer, MD as PCP - General (Internal Medicine)  CHIEF COMPLAINT:  Chief Complaint  Patient presents with  . Ovarian Cancer    INTERVAL HISTORY: Patient returns to clinic today for further evaluation and consideration of cycle 3, day 15 of single agent Taxotere. Her back pain is significantly worse this week. Her weakness and fatigue are unchanged. The neuropathy and edema in her lower extremities is unchanged. She denies any fevers. She denies any chest pain or shortness of breath. She has a fair appetite and denies weight loss. She denies any nausea, vomiting, or constipation. Patient offers no further specific complaints.   REVIEW OF SYSTEMS:   Review of Systems  Constitutional: Positive for malaise/fatigue. Negative for weight loss.  Cardiovascular: Positive for leg swelling.  Gastrointestinal: Negative for diarrhea.  Genitourinary: Negative.   Musculoskeletal: Positive for back pain.  Neurological: Positive for sensory change and weakness.    As per HPI. Otherwise, a complete review of systems is negatve.  PAST MEDICAL HISTORY: Past Medical History  Diagnosis Date  . Ischemic bowel syndrome ([place]) 06/2010  . Irritable bowel   . C. difficile colitis 2011  . Allergic rhinitis     Followed by Dr. Donneta Romberg  . Esophageal reflux     Followed by GI physician, on Dexilant and Ranitidine  . Migraine   . 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 Osteoporosis 2009    on Reclast once yearly, Followed by Dr. Jefm Bryant  . Ovarian cancer ([place])   . COPD (chronic obstructive pulmonary disease) ([place])   . Thrombocytopenia ([place])   . MVP (mitral valve prolapse)   . SVT (supraventricular tachycardia) ([place])     PAST SURGICAL HISTORY: Past Surgical History  Procedure Laterality Date  .  Bil roken wrists    . Gallbladder removed    . Bilateral bunionectomies    . Cesarean section    . Abdominal hysterectomy    . Cholecystectomy      FAMILY HISTORY: Unchanged. No reported history of malignancy or chronic disease.     ADVANCED DIRECTIVES:    HEALTH MAINTENANCE: Social History  Substance Use Topics  . Smoking status: Former Smoker    Quit date: 06/12/1989  . Smokeless tobacco: Never Used  . Alcohol Use: No     Colonoscopy:  PAP:  Bone density:  Lipid panel:  Allergies  Allergen Reactions  . Aspirin     Other reaction(s): Localized superficial swelling of skin uncoated asa-causes throat to swell up  . Codeine Itching    Other reaction(s): Itching of Skin  . Fosamax  [Alendronate Sodium]     Other reaction(s): Distress (finding)  . Ibuprofen   . Ibuprofen     Other reaction(s): Distress (finding)  . Metronidazole Nausea And Vomiting  . Naproxen     Other reaction(s): Distress (finding)  . Naproxen Sodium   . Nsaids     GI UPSET / GERD  . Oxycodone     Other reaction(s): Diarrhea and vomiting (finding)  . Oxycodone-Acetaminophen     Other reaction(s): Diarrhea and vomiting (finding)    Current Facility-Administered Medications  Medication Dose Route Frequency Provider Last Rate Last Dose  . 0.9 %  sodium chloride infusion   Intravenous Continuous Lloyd Huger, MD       Current Outpatient Prescriptions  Medication Sig Dispense Refill  . [DISCONTINUED] dexlansoprazole (DEXILANT) 60 MG capsule Take 60 mg by mouth daily.       Facility-Administered Medications Ordered in Other Visits  Medication Dose Route Frequency Provider Last Rate Last Dose  . 0.9 %  sodium chloride infusion   Intravenous Continuous Lloyd Huger, MD   Stopped at 05/18/15 1252  . 0.9 %  sodium chloride infusion   Intravenous Continuous Juluis Mire, MD 100 mL/hr at 06/01/15 0115    . acetaminophen (TYLENOL) tablet 1,000 mg  1,000 mg Oral Q6H PRN Lenis Noon,  RPH      . acidophilus (RISAQUAD) capsule 1 capsule  1 capsule Oral Daily Lenis Noon, RPH   1 capsule at 05/30/15 1010  . albuterol (PROVENTIL) (2.5 MG/3ML) 0.083% nebulizer solution 2.5 mg  2.5 mg Nebulization Q4H PRN Juluis Mire, MD      . amoxicillin (AMOXIL) capsule 500 mg  500 mg Oral 3 times per day Henreitta Leber, MD   500 mg at 06/01/15 0448  . bisacodyl (DULCOLAX) suppository 10 mg  10 mg Rectal Daily PRN Hessie Knows, MD      . calcium-vitamin D (OSCAL WITH D) 500-200 MG-UNIT per tablet 1 tablet  1 tablet Oral BID Lenis Noon, [facility]   1 tablet at 05/31/15 2302  . dexamethasone (DECADRON) tablet 4 mg  4 mg Oral Daily Juluis Mire, MD   4 mg at 05/30/15 1009  . diazepam (VALIUM) injection 2.5 mg  2.5 mg Intravenous Q4H PRN Lytle Butte, MD   2.5 mg at 05/31/15 2329  . docusate sodium (COLACE) capsule 100 mg  100 mg Oral BID Hessie Knows, MD   100 mg at 05/31/15 2321  . doxycycline (VIBRA-TABS) tablet 100 mg  100 mg Oral Q12H Henreitta Leber, MD      . feeding supplement (ENSURE ENLIVE) (ENSURE ENLIVE) liquid 237 mL  237 mL Oral BID BM Henreitta Leber, MD   237 mL at 05/31/15 1400  . fluconazole (DIFLUCAN) tablet 100 mg  100 mg Oral Daily Lloyd Huger, MD   100 mg at 05/31/15 1430  . furosemide (LASIX) tablet 20 mg  20 mg Oral Daily Juluis Mire, MD   20 mg at 05/30/15 1011  . heparin ADULT infusion 100 units/mL (25000 units/250 mL)  700 Units/hr Intravenous Continuous Henreitta Leber, MD 8 mL/hr at 05/31/15 0248 800 Units/hr at 05/31/15 0248  . magnesium citrate solution 1 Bottle  1 Bottle Oral Once PRN Hessie Knows, MD      . magnesium hydroxide (MILK OF MAGNESIA) suspension 30 mL  30 mL Oral Daily PRN Hessie Knows, MD      . metoCLOPramide (REGLAN) tablet 5-10 mg  5-10 mg Oral Q8H PRN Hessie Knows, MD       Or  . metoCLOPramide (REGLAN) injection 5-10 mg  5-10 mg Intravenous Q8H PRN Hessie Knows, MD      . morphine 2 MG/ML injection 2 mg  2 mg Intravenous Q4H PRN  Juluis Mire, MD   2 mg at 06/01/15 0118  . multivitamin with minerals tablet 1 tablet  1 tablet Oral Daily Juluis Mire, MD   1 tablet at 05/30/15 1011  . nystatin (MYCOSTATIN) 100000 UNIT/ML suspension 500,000 Units  5 mL Oral QID Henreitta Leber, MD   500,000 Units at 05/30/15 1800  . ondansetron (ZOFRAN) tablet 4 mg  4 mg Oral Q6H PRN Juluis Mire,  MD       Or  . ondansetron (ZOFRAN) injection 4 mg  4 mg Intravenous Q6H PRN Juluis Mire, MD      . pantoprazole (PROTONIX) EC tablet 40 mg  40 mg Oral Daily Juluis Mire, MD   40 mg at 05/30/15 1011  . potassium chloride SA (K-DUR,KLOR-CON) CR tablet 20 mEq  20 mEq Oral BID Juluis Mire, MD   20 mEq at 05/31/15 2257  . sodium chloride 0.9 % injection 3 mL  3 mL Intravenous Q12H Juluis Mire, MD   3 mL at 05/31/15 2308  . topiramate (TOPAMAX) tablet 25 mg  25 mg Oral Daily Lenis Noon, RPH   25 mg at 05/31/15 2257  . traMADol (ULTRAM) tablet 50 mg  50 mg Oral Q6H PRN Juluis Mire, MD      . zolpidem [facility]) tablet 5 mg  5 mg Oral QHS PRN Juluis Mire, MD   5 mg at 05/30/15 2158    OBJECTIVE: Filed Vitals:   05/25/15 1014  BP: 99/68  Pulse: 97  Temp: 98.9 F (37.2 C)  Resp: 16     Body mass index is 20.47 kg/(m^2).    ECOG FS:1 - Symptomatic but completely ambulatory  General: Well-developed, well-nourished, no acute distress.  Eyes: anicteric sclera. Lungs: Clear to auscultation bilaterally. Heart: Regular rate and rhythm. No rubs, murmurs, or gallops. Abdomen: Soft, nontender, nondistended. No organomegaly noted, normoactive bowel sounds. Musculoskeletal: 2+ bilateral lower extremity edema. Neuro: Alert, answering all questions appropriately. Cranial nerves grossly intact. Skin: No rashes or petechiae noted. Psych: Normal affect.   LAB RESULTS:  Lab Results  Component Value Date   NA 134* 05/30/2015   K 3.2* 05/30/2015   CL 103 05/30/2015   CO2 24 05/30/2015   GLUCOSE 105* 05/30/2015     BUN 16 05/30/2015   CREATININE 0.66 05/30/2015   CALCIUM 7.9* 05/30/2015   PROT 5.7* 05/29/2015   ALBUMIN 3.0* 05/29/2015   AST 33 05/29/2015   ALT 32 05/29/2015   ALKPHOS 65 05/29/2015   BILITOT 1.3* 05/29/2015   GFRNONAA >60 05/30/2015   GFRAA >60 05/30/2015    Lab Results  Component Value Date   WBC 3.4* 06/01/2015   NEUTROABS 3.5 05/29/2015   HGB 9.5* 06/01/2015   HCT 27.1* 06/01/2015   MCV 102.1* 06/01/2015   PLT 75* 06/01/2015     STUDIES: Dg Lumbar Spine 2-3 Views  05/31/2015  CLINICAL DATA:  Two level kyphoplasty. EXAM: LUMBAR SPINE - 2-3 VIEW; DG C-ARM 61-120 MIN COMPARISON:  MRI 05/30/2015 FINDINGS: Examination demonstrates normal vertebral body alignment. There is evidence of patient's recent kyphoplasty of L2 and L4. Injected radiopaque material remains within the confines of the vertebral body borders. Recommend correlation with findings at the time of the procedure. IMPRESSION: Post kyphoplasty of L2 and L4. Electronically Signed   By: Marin Olp M.D.   On: 05/31/2015 19:27   Ct Angio Chest Pe W/cm &/or Wo Cm  05/29/2015  CLINICAL DATA:  Back pain for 2 weeks EXAM: CT ANGIOGRAPHY CHEST, ABDOMEN AND PELVIS TECHNIQUE: Multidetector CT imaging through the chest, abdomen and pelvis was performed using the standard protocol during bolus administration of intravenous contrast. Multiplanar reconstructed images and MIPs were obtained and reviewed to evaluate the vascular anatomy. CONTRAST:  73mL OMNIPAQUE IOHEXOL 350 MG/ML SOLN COMPARISON:  03/03/2015. FINDINGS: CTA CHEST FINDINGS Lungs are well aerated bilaterally. Biapical subpleural and parenchymal scarring is noted. No sizable infiltrate is seen.  Mild scarring is noted inferiorly in the right lung as well. The thoracic inlet is within normal limits. The thoracic aorta and its branches show mild atherosclerotic change. No aneurysmal dilatation or dissection is identified. The pulmonary artery as visualized is within  normal limits. No significant hilar or mediastinal adenopathy is noted. No acute bony abnormality is noted. Review of the MIP images confirms the above findings. CTA ABDOMEN AND PELVIS FINDINGS The gallbladder has been surgically removed. The liver is diffusely decreased in attenuation consistent with fatty infiltration. The spleen, adrenal glands and pancreas are within normal limits. Kidneys are well visualized without evidence renal calculi or urinary tract obstructive change. A left retro aortic renal vein is noted. The urinary bladder is within normal limits. Mild diverticular change is noted without evidence of diverticulitis. The appendix is not well seen. No inflammatory changes are noted. The uterus has been surgically removed. The abdominal aorta shows a normal branching pattern. Mild aortic calcifications are seen. No aneurysm or dissection is identified. The osseous structures show compression deformity of the superior endplate of L2 and inferior endplate of L4. These changes are new from the prior exam. Review of the MIP images confirms the above findings. IMPRESSION: No evidence of dissection or aneurysmal dilatation. No evidence of pulmonary embolism. L2 and L4 compression deformities as described which are new from September of 2016. They may be related to the patient's recent injury. Electronically Signed   By: Inez Catalina M.D.   On: 05/29/2015 14:22   Mr Lumbar Spine W Wo Contrast  05/30/2015  CLINICAL DATA:  Low back pain following twisting injury approximately 2 weeks ago. New fractures at L2 and L4 on recent PET-CT. History of metastatic ovarian cancer. EXAM: MRI LUMBAR SPINE WITHOUT AND WITH CONTRAST TECHNIQUE: Multiplanar and multiecho pulse sequences of the lumbar spine were obtained without and with intravenous contrast. CONTRAST:  66mL MULTIHANCE GADOBENATE DIMEGLUMINE 529 MG/ML IV SOLN COMPARISON:  PET-CT 03/03/2015. Abdominal pelvic CT 05/25/2014 and 05/29/2015. FINDINGS:  Segmentation: Conventional anatomy assumed, with the last open disc space designated L5-S1. Alignment:  Normal. Bones: As demonstrated on recent CT, there are fractures at L2 and L4 which are new from the PET-CT of September. At L2, there is a superior endplate compression fracture resulting in approximately 20% loss vertebral body height and 6 mm of osseous retropulsion. At L4, there is an inferior endplate compression deformity resulting in less than 20% loss of vertebral body height centrally and mild osseous retropulsion. Both fractures demonstrate mild adjacent marrow edema and enhancement. Neither fracture demonstrates any pathologic features or associated epidural hematoma. There are no osseous findings worrisome for metastatic disease. There is a 5 mm lesion in the left L4 pedicle which demonstrates increased T1 and T2 signal, probably a small hemangioma. This is not clearly seen on older or recent CTs. Conus medullaris: Extends to the L1-2 level and appears normal. No abnormal intradural enhancement. Paraspinal and other soft tissues: There is a small amount of paraspinal edema adjacent to both fractures. The visualized retroperitoneum appears grossly stable with small lymph nodes. There is a possible small right-sided retrocrural lymphocele, measuring 11 mm on axial image 3 of series 5. Disc levels: L1-2: The disc appears normal. There is mild osseous retropulsion related to the L2 fracture. No foraminal compromise or nerve root encroachment. L2-3: Mild disc desiccation with preserved height. Mild bilateral facet hypertrophy. No spinal stenosis or nerve root encroachment. L3-4: Mild disc desiccation with preserved height. Mild bilateral facet hypertrophy. No significant spinal  stenosis or nerve root encroachment. L4-5: Disc bulging, facet and ligamentous hypertrophy superimposed on short pedicles result in mild spinal stenosis. There is minimal osseous retropulsion related to the L4 fracture. No evidence  of nerve root encroachment. L5-S1: Chronic degenerative disc disease with loss of disc height, paraspinal osteophytes and vacuum phenomenon. No significant spinal stenosis or nerve root encroachment. IMPRESSION: 1. The recently demonstrated fractures at L2 and L4 are unchanged from CT of yesterday. These are consistent with benign/posttraumatic fractures and appear acute/subacute in age. There is mild associated osseous retropulsion and paraspinal hemorrhage. 2. No evidence of metastatic disease or pathologic fracture. 3. Mild spondylosis, greatest at L5-S1. No evidence of nerve root encroachment Electronically Signed   By: Richardean Sale M.D.   On: 05/30/2015 13:06   Dg C-arm 1-60 Min  05/31/2015  CLINICAL DATA:  Two level kyphoplasty. EXAM: LUMBAR SPINE - 2-3 VIEW; DG C-ARM 61-120 MIN COMPARISON:  MRI 05/30/2015 FINDINGS: Examination demonstrates normal vertebral body alignment. There is evidence of patient's recent kyphoplasty of L2 and L4. Injected radiopaque material remains within the confines of the vertebral body borders. Recommend correlation with findings at the time of the procedure. IMPRESSION: Post kyphoplasty of L2 and L4. Electronically Signed   By: Marin Olp M.D.   On: 05/31/2015 19:27   Ct Cta Abd/pel W/cm &/or W/o Cm  05/29/2015  CLINICAL DATA:  Back pain for 2 weeks EXAM: CT ANGIOGRAPHY CHEST, ABDOMEN AND PELVIS TECHNIQUE: Multidetector CT imaging through the chest, abdomen and pelvis was performed using the standard protocol during bolus administration of intravenous contrast. Multiplanar reconstructed images and MIPs were obtained and reviewed to evaluate the vascular anatomy. CONTRAST:  54mL OMNIPAQUE IOHEXOL 350 MG/ML SOLN COMPARISON:  03/03/2015. FINDINGS: CTA CHEST FINDINGS Lungs are well aerated bilaterally. Biapical subpleural and parenchymal scarring is noted. No sizable infiltrate is seen. Mild scarring is noted inferiorly in the right lung as well. The thoracic inlet is  within normal limits. The thoracic aorta and its branches show mild atherosclerotic change. No aneurysmal dilatation or dissection is identified. The pulmonary artery as visualized is within normal limits. No significant hilar or mediastinal adenopathy is noted. No acute bony abnormality is noted. Review of the MIP images confirms the above findings. CTA ABDOMEN AND PELVIS FINDINGS The gallbladder has been surgically removed. The liver is diffusely decreased in attenuation consistent with fatty infiltration. The spleen, adrenal glands and pancreas are within normal limits. Kidneys are well visualized without evidence renal calculi or urinary tract obstructive change. A left retro aortic renal vein is noted. The urinary bladder is within normal limits. Mild diverticular change is noted without evidence of diverticulitis. The appendix is not well seen. No inflammatory changes are noted. The uterus has been surgically removed. The abdominal aorta shows a normal branching pattern. Mild aortic calcifications are seen. No aneurysm or dissection is identified. The osseous structures show compression deformity of the superior endplate of L2 and inferior endplate of L4. These changes are new from the prior exam. Review of the MIP images confirms the above findings. IMPRESSION: No evidence of dissection or aneurysmal dilatation. No evidence of pulmonary embolism. L2 and L4 compression deformities as described which are new from September of 2016. They may be related to the patient's recent injury. Electronically Signed   By: Inez Catalina M.D.   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  PLAN:    1. Ovarian cancer: PET scan results from  February 19, 2015 reviewed independently and report progression of disease. CA-125 has trended back down and is now 51.0. Proceed with cycle 3, day 15 of Taxotere 25 mg/m2.  She will receive this regimen on days 1, 8, 15 with  day 22 off provided her pancytopenia remains adequate to treat. Return to clinic in 1 month in mid January with repeat PET scan and further evaluation.  2. Peripheral edema: Likely multifactorial. Continue diuretic as prescribed and recommendations per lymphedema clinic.  3. Weakness and fatigue/decreased performance status: Unchanged, monitor. Patient will receive 1 L of IV fluids today. 4. Thrombocytopenia: Proceed cautiously with treatment as above.  5. Diarrhea: Continue Imodium as needed. 6. Rash: Continue over-the-counter hydrocortisone cream, dexamethasone, and Benadryl as needed.  7. Back pain: MRI has been ordered. Patient cannot take narcotics for pain, therefore continue tramadol as prescribed.  Patient expressed understanding and was in agreement with this plan. She also understands that She can call clinic at any time with any questions, concerns, or complaints.    Lloyd Huger, MD   06/01/2015 8:29 AM

## 2015-06-01 NOTE — Progress Notes (Signed)
Paged MD about something for back spasms and whether or not to restart her Heparin drip.  Dr. Lavetta Nielsen said to hold the Heparin drip for this evening and she would probably start back on Eliquis tomorrow morning or heparin.  That will remain to be determined in the am.  Valium given for back spasms.

## 2015-06-01 NOTE — Progress Notes (Signed)
Received report from [facility] at 16:30, pt resting in bed quietly, family at bedside, c/o back pain improved with IV morphine, IV fluids infusing, kyphoplasty performed on 12/19.

## 2015-06-01 NOTE — Progress Notes (Signed)
[place]  Telephone:(336) [phone] [phone]) [phone]  ID: Amanda Robbins OB: 06/06/1941  MR#: [id number]  [id number]  Patient Care Team: Jackolyn Confer, MD as PCP - General (Internal Medicine)  CHIEF COMPLAINT:  Chief Complaint  Patient presents with  . Ovarian Cancer    INTERVAL HISTORY: Patient returns to clinic today for further evaluation and consideration of cycle 3, day 8 of single agent Taxotere. Patient states she is moving furniture earlier this week and hurt her back, but otherwise has felt well. Her weakness and fatigue are unchanged. The neuropathy and edema in her lower extremities is unchanged. She denies any fevers. She denies any chest pain or shortness of breath. She has a fair appetite and denies weight loss. She denies any nausea, vomiting, or constipation. Patient offers no further specific complaints.   REVIEW OF SYSTEMS:   Review of Systems  Constitutional: Positive for malaise/fatigue. Negative for weight loss.  Cardiovascular: Positive for leg swelling.  Gastrointestinal: Negative for diarrhea.  Genitourinary: Negative.   Musculoskeletal: Positive for back pain.  Neurological: Positive for sensory change and weakness.    As per HPI. Otherwise, a complete review of systems is negatve.  PAST MEDICAL HISTORY: Past Medical History  Diagnosis Date  . Ischemic bowel syndrome ([place]) 06/2010  . Irritable bowel   . C. difficile colitis 2011  . Allergic rhinitis     Followed by Dr. Donneta Romberg  . Esophageal reflux     Followed by GI physician, on Dexilant and Ranitidine  . Migraine   . 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 Osteoporosis 2009    on Reclast once yearly, Followed by Dr. Jefm Bryant  . Ovarian cancer ([place])   . COPD (chronic obstructive pulmonary disease) ([place])   . Thrombocytopenia ([place])   . MVP (mitral valve prolapse)   . SVT (supraventricular tachycardia) ([place])     PAST SURGICAL  HISTORY: Past Surgical History  Procedure Laterality Date  . Bil roken wrists    . Gallbladder removed    . Bilateral bunionectomies    . Cesarean section    . Abdominal hysterectomy    . Cholecystectomy      FAMILY HISTORY: Unchanged. No reported history of malignancy or chronic disease.     ADVANCED DIRECTIVES:    HEALTH MAINTENANCE: Social History  Substance Use Topics  . Smoking status: Former Smoker    Quit date: 06/12/1989  . Smokeless tobacco: Never Used  . Alcohol Use: No     Colonoscopy:  PAP:  Bone density:  Lipid panel:  Allergies  Allergen Reactions  . Aspirin     Other reaction(s): Localized superficial swelling of skin uncoated asa-causes throat to swell up  . Codeine Itching    Other reaction(s): Itching of Skin  . Fosamax  [Alendronate Sodium]     Other reaction(s): Distress (finding)  . Ibuprofen   . Ibuprofen     Other reaction(s): Distress (finding)  . Metronidazole Nausea And Vomiting  . Naproxen     Other reaction(s): Distress (finding)  . Naproxen Sodium   . Nsaids     GI UPSET / GERD  . Oxycodone     Other reaction(s): Diarrhea and vomiting (finding)  . Oxycodone-Acetaminophen     Other reaction(s): Diarrhea and vomiting (finding)    Current Facility-Administered Medications  Medication Dose Route Frequency Provider Last Rate Last Dose  . 0.9 %  sodium chloride infusion   Intravenous Continuous Lloyd Huger,  MD   Stopped at 05/18/15 1252   Current Outpatient Prescriptions  Medication Sig Dispense Refill  . [DISCONTINUED] dexlansoprazole (DEXILANT) 60 MG capsule Take 60 mg by mouth daily.       Facility-Administered Medications Ordered in Other Visits  Medication Dose Route Frequency Provider Last Rate Last Dose  . 0.9 %  sodium chloride infusion   Intravenous Continuous Lloyd Huger, MD      . 0.9 %  sodium chloride infusion   Intravenous Continuous Juluis Mire, MD 100 mL/hr at 06/01/15 0115    . acetaminophen  (TYLENOL) tablet 1,000 mg  1,000 mg Oral Q6H PRN Lenis Noon, RPH      . acidophilus (RISAQUAD) capsule 1 capsule  1 capsule Oral Daily Lenis Noon, RPH   1 capsule at 05/30/15 1010  . albuterol (PROVENTIL) (2.5 MG/3ML) 0.083% nebulizer solution 2.5 mg  2.5 mg Nebulization Q4H PRN Juluis Mire, MD      . amoxicillin (AMOXIL) capsule 500 mg  500 mg Oral 3 times per day Henreitta Leber, MD   500 mg at 06/01/15 0448  . bisacodyl (DULCOLAX) suppository 10 mg  10 mg Rectal Daily PRN Hessie Knows, MD      . calcium-vitamin D (OSCAL WITH D) 500-200 MG-UNIT per tablet 1 tablet  1 tablet Oral BID Lenis Noon, [facility]   1 tablet at 05/31/15 2302  . dexamethasone (DECADRON) tablet 4 mg  4 mg Oral Daily Juluis Mire, MD   4 mg at 05/30/15 1009  . diazepam (VALIUM) injection 2.5 mg  2.5 mg Intravenous Q4H PRN Lytle Butte, MD   2.5 mg at 05/31/15 2329  . docusate sodium (COLACE) capsule 100 mg  100 mg Oral BID Hessie Knows, MD   100 mg at 05/31/15 2321  . doxycycline (VIBRA-TABS) tablet 100 mg  100 mg Oral Q12H Henreitta Leber, MD      . feeding supplement (ENSURE ENLIVE) (ENSURE ENLIVE) liquid 237 mL  237 mL Oral BID BM Henreitta Leber, MD   237 mL at 05/31/15 1400  . fluconazole (DIFLUCAN) tablet 100 mg  100 mg Oral Daily Lloyd Huger, MD   100 mg at 05/31/15 1430  . furosemide (LASIX) tablet 20 mg  20 mg Oral Daily Juluis Mire, MD   20 mg at 05/30/15 1011  . heparin ADULT infusion 100 units/mL (25000 units/250 mL)  700 Units/hr Intravenous Continuous Henreitta Leber, MD 8 mL/hr at 05/31/15 0248 800 Units/hr at 05/31/15 0248  . magnesium citrate solution 1 Bottle  1 Bottle Oral Once PRN Hessie Knows, MD      . magnesium hydroxide (MILK OF MAGNESIA) suspension 30 mL  30 mL Oral Daily PRN Hessie Knows, MD      . metoCLOPramide (REGLAN) tablet 5-10 mg  5-10 mg Oral Q8H PRN Hessie Knows, MD       Or  . metoCLOPramide (REGLAN) injection 5-10 mg  5-10 mg Intravenous Q8H PRN Hessie Knows, MD       . morphine 2 MG/ML injection 2 mg  2 mg Intravenous Q4H PRN Juluis Mire, MD   2 mg at 06/01/15 0118  . multivitamin with minerals tablet 1 tablet  1 tablet Oral Daily Juluis Mire, MD   1 tablet at 05/30/15 1011  . nystatin (MYCOSTATIN) 100000 UNIT/ML suspension 500,000 Units  5 mL Oral QID Henreitta Leber, MD   500,000 Units at 05/30/15 1800  . ondansetron (ZOFRAN) tablet  4 mg  4 mg Oral Q6H PRN Juluis Mire, MD       Or  . ondansetron [facility]) injection 4 mg  4 mg Intravenous Q6H PRN Juluis Mire, MD      . pantoprazole (PROTONIX) EC tablet 40 mg  40 mg Oral Daily Juluis Mire, MD   40 mg at 05/30/15 1011  . potassium chloride SA (K-DUR,KLOR-CON) CR tablet 20 mEq  20 mEq Oral BID Juluis Mire, MD   20 mEq at 05/31/15 2257  . sodium chloride 0.9 % injection 3 mL  3 mL Intravenous Q12H Juluis Mire, MD   3 mL at 05/31/15 2308  . topiramate (TOPAMAX) tablet 25 mg  25 mg Oral Daily Lenis Noon, RPH   25 mg at 05/31/15 2257  . traMADol (ULTRAM) tablet 50 mg  50 mg Oral Q6H PRN Juluis Mire, MD      . zolpidem [facility]) tablet 5 mg  5 mg Oral QHS PRN Juluis Mire, MD   5 mg at 05/30/15 2158    OBJECTIVE: Filed Vitals:   05/18/15 0958  BP: 122/73  Pulse: 101  Temp: 99.2 F (37.3 C)  Resp: 16     Body mass index is 20.29 kg/(m^2).    ECOG FS:1 - Symptomatic but completely ambulatory  General: Well-developed, well-nourished, no acute distress.  Eyes: anicteric sclera. Lungs: Clear to auscultation bilaterally. Heart: Regular rate and rhythm. No rubs, murmurs, or gallops. Abdomen: Soft, nontender, nondistended. No organomegaly noted, normoactive bowel sounds. Musculoskeletal: 2+ bilateral lower extremity edema. Neuro: Alert, answering all questions appropriately. Cranial nerves grossly intact. Skin: No rashes or petechiae noted. Psych: Normal affect.   LAB RESULTS:  Lab Results  Component Value Date   NA 134* 05/30/2015   K 3.2* 05/30/2015   CL  103 05/30/2015   CO2 24 05/30/2015   GLUCOSE 105* 05/30/2015   BUN 16 05/30/2015   CREATININE 0.66 05/30/2015   CALCIUM 7.9* 05/30/2015   PROT 5.7* 05/29/2015   ALBUMIN 3.0* 05/29/2015   AST 33 05/29/2015   ALT 32 05/29/2015   ALKPHOS 65 05/29/2015   BILITOT 1.3* 05/29/2015   GFRNONAA >60 05/30/2015   GFRAA >60 05/30/2015    Lab Results  Component Value Date   WBC 3.4* 06/01/2015   NEUTROABS 3.5 05/29/2015   HGB 9.5* 06/01/2015   HCT 27.1* 06/01/2015   MCV 102.1* 06/01/2015   PLT 75* 06/01/2015     STUDIES: Dg Lumbar Spine 2-3 Views  05/31/2015  CLINICAL DATA:  Two level kyphoplasty. EXAM: LUMBAR SPINE - 2-3 VIEW; DG C-ARM 61-120 MIN COMPARISON:  MRI 05/30/2015 FINDINGS: Examination demonstrates normal vertebral body alignment. There is evidence of patient's recent kyphoplasty of L2 and L4. Injected radiopaque material remains within the confines of the vertebral body borders. Recommend correlation with findings at the time of the procedure. IMPRESSION: Post kyphoplasty of L2 and L4. Electronically Signed   By: Marin Olp M.D.   On: 05/31/2015 19:27   Ct Angio Chest Pe W/cm &/or Wo Cm  05/29/2015  CLINICAL DATA:  Back pain for 2 weeks EXAM: CT ANGIOGRAPHY CHEST, ABDOMEN AND PELVIS TECHNIQUE: Multidetector CT imaging through the chest, abdomen and pelvis was performed using the standard protocol during bolus administration of intravenous contrast. Multiplanar reconstructed images and MIPs were obtained and reviewed to evaluate the vascular anatomy. CONTRAST:  34mL OMNIPAQUE IOHEXOL 350 MG/ML SOLN COMPARISON:  03/03/2015. FINDINGS: CTA CHEST FINDINGS Lungs are well aerated bilaterally. Biapical subpleural  and parenchymal scarring is noted. No sizable infiltrate is seen. Mild scarring is noted inferiorly in the right lung as well. The thoracic inlet is within normal limits. The thoracic aorta and its branches show mild atherosclerotic change. No aneurysmal dilatation or dissection  is identified. The pulmonary artery as visualized is within normal limits. No significant hilar or mediastinal adenopathy is noted. No acute bony abnormality is noted. Review of the MIP images confirms the above findings. CTA ABDOMEN AND PELVIS FINDINGS The gallbladder has been surgically removed. The liver is diffusely decreased in attenuation consistent with fatty infiltration. The spleen, adrenal glands and pancreas are within normal limits. Kidneys are well visualized without evidence renal calculi or urinary tract obstructive change. A left retro aortic renal vein is noted. The urinary bladder is within normal limits. Mild diverticular change is noted without evidence of diverticulitis. The appendix is not well seen. No inflammatory changes are noted. The uterus has been surgically removed. The abdominal aorta shows a normal branching pattern. Mild aortic calcifications are seen. No aneurysm or dissection is identified. The osseous structures show compression deformity of the superior endplate of L2 and inferior endplate of L4. These changes are new from the prior exam. Review of the MIP images confirms the above findings. IMPRESSION: No evidence of dissection or aneurysmal dilatation. No evidence of pulmonary embolism. L2 and L4 compression deformities as described which are new from September of 2016. They may be related to the patient's recent injury. Electronically Signed   By: Inez Catalina M.D.   On: 05/29/2015 14:22   Mr Lumbar Spine W Wo Contrast  05/30/2015  CLINICAL DATA:  Low back pain following twisting injury approximately 2 weeks ago. New fractures at L2 and L4 on recent PET-CT. History of metastatic ovarian cancer. EXAM: MRI LUMBAR SPINE WITHOUT AND WITH CONTRAST TECHNIQUE: Multiplanar and multiecho pulse sequences of the lumbar spine were obtained without and with intravenous contrast. CONTRAST:  18mL MULTIHANCE GADOBENATE DIMEGLUMINE 529 MG/ML IV SOLN COMPARISON:  PET-CT 03/03/2015.  Abdominal pelvic CT 05/25/2014 and 05/29/2015. FINDINGS: Segmentation: Conventional anatomy assumed, with the last open disc space designated L5-S1. Alignment:  Normal. Bones: As demonstrated on recent CT, there are fractures at L2 and L4 which are new from the PET-CT of September. At L2, there is a superior endplate compression fracture resulting in approximately 20% loss vertebral body height and 6 mm of osseous retropulsion. At L4, there is an inferior endplate compression deformity resulting in less than 20% loss of vertebral body height centrally and mild osseous retropulsion. Both fractures demonstrate mild adjacent marrow edema and enhancement. Neither fracture demonstrates any pathologic features or associated epidural hematoma. There are no osseous findings worrisome for metastatic disease. There is a 5 mm lesion in the left L4 pedicle which demonstrates increased T1 and T2 signal, probably a small hemangioma. This is not clearly seen on older or recent CTs. Conus medullaris: Extends to the L1-2 level and appears normal. No abnormal intradural enhancement. Paraspinal and other soft tissues: There is a small amount of paraspinal edema adjacent to both fractures. The visualized retroperitoneum appears grossly stable with small lymph nodes. There is a possible small right-sided retrocrural lymphocele, measuring 11 mm on axial image 3 of series 5. Disc levels: L1-2: The disc appears normal. There is mild osseous retropulsion related to the L2 fracture. No foraminal compromise or nerve root encroachment. L2-3: Mild disc desiccation with preserved height. Mild bilateral facet hypertrophy. No spinal stenosis or nerve root encroachment. L3-4: Mild disc desiccation  with preserved height. Mild bilateral facet hypertrophy. No significant spinal stenosis or nerve root encroachment. L4-5: Disc bulging, facet and ligamentous hypertrophy superimposed on short pedicles result in mild spinal stenosis. There is minimal  osseous retropulsion related to the L4 fracture. No evidence of nerve root encroachment. L5-S1: Chronic degenerative disc disease with loss of disc height, paraspinal osteophytes and vacuum phenomenon. No significant spinal stenosis or nerve root encroachment. IMPRESSION: 1. The recently demonstrated fractures at L2 and L4 are unchanged from CT of yesterday. These are consistent with benign/posttraumatic fractures and appear acute/subacute in age. There is mild associated osseous retropulsion and paraspinal hemorrhage. 2. No evidence of metastatic disease or pathologic fracture. 3. Mild spondylosis, greatest at L5-S1. No evidence of nerve root encroachment Electronically Signed   By: Richardean Sale M.D.   On: 05/30/2015 13:06   Dg C-arm 1-60 Min  05/31/2015  CLINICAL DATA:  Two level kyphoplasty. EXAM: LUMBAR SPINE - 2-3 VIEW; DG C-ARM 61-120 MIN COMPARISON:  MRI 05/30/2015 FINDINGS: Examination demonstrates normal vertebral body alignment. There is evidence of patient's recent kyphoplasty of L2 and L4. Injected radiopaque material remains within the confines of the vertebral body borders. Recommend correlation with findings at the time of the procedure. IMPRESSION: Post kyphoplasty of L2 and L4. Electronically Signed   By: Marin Olp M.D.   On: 05/31/2015 19:27   Ct Cta Abd/pel W/cm &/or W/o Cm  05/29/2015  CLINICAL DATA:  Back pain for 2 weeks EXAM: CT ANGIOGRAPHY CHEST, ABDOMEN AND PELVIS TECHNIQUE: Multidetector CT imaging through the chest, abdomen and pelvis was performed using the standard protocol during bolus administration of intravenous contrast. Multiplanar reconstructed images and MIPs were obtained and reviewed to evaluate the vascular anatomy. CONTRAST:  57mL OMNIPAQUE IOHEXOL 350 MG/ML SOLN COMPARISON:  03/03/2015. FINDINGS: CTA CHEST FINDINGS Lungs are well aerated bilaterally. Biapical subpleural and parenchymal scarring is noted. No sizable infiltrate is seen. Mild scarring is noted  inferiorly in the right lung as well. The thoracic inlet is within normal limits. The thoracic aorta and its branches show mild atherosclerotic change. No aneurysmal dilatation or dissection is identified. The pulmonary artery as visualized is within normal limits. No significant hilar or mediastinal adenopathy is noted. No acute bony abnormality is noted. Review of the MIP images confirms the above findings. CTA ABDOMEN AND PELVIS FINDINGS The gallbladder has been surgically removed. The liver is diffusely decreased in attenuation consistent with fatty infiltration. The spleen, adrenal glands and pancreas are within normal limits. Kidneys are well visualized without evidence renal calculi or urinary tract obstructive change. A left retro aortic renal vein is noted. The urinary bladder is within normal limits. Mild diverticular change is noted without evidence of diverticulitis. The appendix is not well seen. No inflammatory changes are noted. The uterus has been surgically removed. The abdominal aorta shows a normal branching pattern. Mild aortic calcifications are seen. No aneurysm or dissection is identified. The osseous structures show compression deformity of the superior endplate of L2 and inferior endplate of L4. These changes are new from the prior exam. Review of the MIP images confirms the above findings. IMPRESSION: No evidence of dissection or aneurysmal dilatation. No evidence of pulmonary embolism. L2 and L4 compression deformities as described which are new from September of 2016. They may be related to the patient's recent injury. Electronically Signed   By: Inez Catalina M.D.   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  PLAN:  1. Ovarian cancer: PET scan results from February 19, 2015 reviewed independently and report progression of disease. CA-125 has trended back down and is now 51.0. Proceed with cycle 3, day 8 of Taxotere 25  mg/m2.  She will receive this regimen on days 1, 8, 15 with day 22 off provided her pancytopenia remains adequate to treat. Return to clinic in 1 week for consideration of cycle 3, day 15. Will reimage in approximately January 2017 at the conclusion of cycle 3.   2. Peripheral edema: Likely multifactorial. Continue diuretic as prescribed and recommendations per lymphedema clinic.  3. Weakness and fatigue/decreased performance status: Unchanged, monitor. Patient will receive 1 L of IV fluids today. 4. Thrombocytopenia: Proceed cautiously with treatment as above.  5. Diarrhea: Continue Imodium as needed. 6. Rash: Continue over-the-counter hydrocortisone cream, dexamethasone, and Benadryl as needed.  7. Back pain: Likely mask a skeletal nature, monitor.  Patient expressed understanding and was in agreement with this plan. She also understands that She can call clinic at any time with any questions, concerns, or complaints.    Lloyd Huger, MD   06/01/2015 8:26 AM

## 2015-06-01 NOTE — Progress Notes (Signed)
Physical Therapy Treatment Patient Details Name: Amanda Robbins MRN: [id number] DOB: 1940/08/20 Today's Date: 06/01/2015    History of Present Illness [name] is a 74 y.o. 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 Patient states when she was trying to lift some weight 2 weeks ago she developed back pain which continued since then and worsened today. Denies any focal weakness or numbness, bladder or bowel disturbances. Denies any fever, chills, chest pain, nausea, vomiting, diarrhea, dysuria, abdominal pain. On arrival to the ED, patient was noted to be hypotensive with blood pressure 79/45 and tachycardic with heart rate of 115 and temperature of 97 degrees Fahrenheit. She was also noted to have redness and swelling of right lower extremity with increased local warmth. Lab work revealed WBC of 3.9, potassium 3.4 and lactate 3.1 and platelets 92K which is chronic. Urinalysis WBC 6-30 with trace LE. CT of the chest abdomen and the pelvis reported negative for PE/ infilltrate, acute intra-abdominal pathology but L2 and L4 compression fractures. She was given 2 L of IV fluids following which her blood pressure improved and currently is maintaining around 122/65. Heart rate also improved and currently maintaining running around 100-105. After obtaining blood cultures, patient was started on IV vancomycin with the diagnosis of sepsis due to cellulitis and hospitalist service was consulted for further management. Ortho consulted and is considering kyphoplasty pending input from oncology. Prior to injury pt reports independence with ADLs but assist for IADLs. Pt uses a rollator for mobility and denies falls in the last 12 months.     PT Comments    Pt experiencing decreasing pain today post kyphoplasty. Pt continues to require Min A for bed  mobility. 2 person assist to reposition upward in bed. Sit to/from stand transfers required verbal cueing for safe hand placement today versus no cueing last session. Pt ambulates to/from bathroom with less pain today. Pt also able to participate in stand exercises with 1 rest period. Pt fatigues easily. Continue PT for progression of safe functional mobility to allow safe return home post hospital stay.   Follow Up Recommendations  Home health PT;Supervision - Intermittent     Equipment Recommendations  None recommended by PT    Recommendations for Other Services       Precautions / Restrictions Precautions Precautions: Fall Restrictions Weight Bearing Restrictions: No    Mobility  Bed Mobility Overal bed mobility: Needs Assistance Bed Mobility: Supine to Sit;Sit to Supine     Supine to sit: Min assist Sit to supine: Min assist   General bed mobility comments: Demonstrates good log roll technique; Min A for LEs, increased time  Transfers Overall transfer level: Needs assistance Equipment used: Rolling walker (2 wheeled) Transfers: Sit to/from Stand Sit to Stand: Min guard         General transfer comment: slow guarded movement; required cues for safe hand placement today  Ambulation/Gait Ambulation/Gait assistance: Min guard Ambulation Distance (Feet): 25 Feet (performed 2x) Assistive device: Rolling walker (2 wheeled) Gait Pattern/deviations: Step-through pattern;Decreased step length - right;Decreased step length - left;Decreased stride length;Trunk flexed Gait velocity: Decreased Gait velocity interpretation: Below normal speed for age/gender General Gait Details: slow/cautious ambulation; guarded movement. Steady   Stairs            Wheelchair Mobility    Modified Rankin (Stroke Patients Only)  Balance           Standing balance support: Bilateral upper extremity supported Standing balance-Leahy Scale: Fair                       Cognition Arousal/Alertness: Awake/alert Behavior During Therapy: WFL for tasks assessed/performed Overall Cognitive Status: Within Functional Limits for tasks assessed                      Exercises General Exercises - Lower Extremity Hip ABduction/ADduction: Strengthening;Both;10 reps;Standing Straight Leg Raises: Strengthening;Both;10 reps;Standing Hip Flexion/Marching: Strengthening;Both;10 reps;Standing Heel Raises: Strengthening;Both;10 reps;Standing    General Comments        Pertinent Vitals/Pain Pain Assessment: 0-10 Pain Score: 4  Pain Location: Mid to low back Pain Descriptors / Indicators: Discomfort Pain Intervention(s): Limited activity within patient's tolerance;Monitored during session    Home Living                      Prior Function            PT Goals (current goals can now be found in the care plan section) Progress towards PT goals: Progressing toward goals    Frequency  7X/week    PT Plan Current plan remains appropriate    Co-evaluation             End of Session Equipment Utilized During Treatment: Gait belt;Oxygen Activity Tolerance: Patient tolerated treatment well;Patient limited by fatigue Patient left: in bed;with call bell/phone within reach;with bed alarm set;with family/visitor present     Time: [id number] PT Time Calculation (min) (ACUTE ONLY): 33 min  Charges:  $Gait Training: 8-22 mins $Therapeutic Exercise: 8-22 mins                    G Codes:      Charlaine Dalton 06/01/2015, 2:11 PM

## 2015-06-02 LAB — SURGICAL PATHOLOGY

## 2015-06-02 MED ORDER — DOXYCYCLINE HYCLATE 100 MG PO TABS: 100.0000 mg | ORAL_TABLET | Freq: Two times a day (BID) | ORAL | Status: DC

## 2015-06-02 MED ORDER — HEPARIN SOD (PORK) LOCK FLUSH 100 UNIT/ML IV SOLN
500.0000 [IU] | INTRAVENOUS | Status: AC | PRN
  Administered 2015-06-02: 500 [IU]
  Filled 2015-06-02: qty 5

## 2015-06-02 MED ORDER — CYCLOBENZAPRINE HCL 5 MG PO TABS: 5.0000 mg | ORAL_TABLET | Freq: Three times a day (TID) | ORAL | Status: DC | PRN

## 2015-06-02 MED ORDER — AMOXICILLIN 500 MG PO CAPS: 500.0000 mg | ORAL_CAPSULE | Freq: Three times a day (TID) | ORAL | Status: AC

## 2015-06-02 NOTE — Plan of Care (Signed)
Problem: Respiratory: Goal: Ability to maintain adequate ventilation will improve Outcome: Progressing Pt alert and oriented. Tramadol and Morphine prn for pain with relief. Up to the Bathroom with one assist. On iv fluids at 147ml/hr. No distress noted. Continue to monitor

## 2015-06-02 NOTE — Discharge Instructions (Signed)
Diet: As you were doing prior to hospitalization   Shower:  May shower but do not submerge incision site under water.  Dressing:  You may change your dressing as needed. Change the dressing with sterile gauze dressing.    Activity:  Increase activity slowly as tolerated,avoid heavy lifting  Weight Bearing:   Weight bearing as tolerated   To prevent constipation: you may use a stool softener such as -  Colace (over the counter) 100 mg by mouth twice a day  Drink plenty of fluids (prune juice may be helpful) and high fiber foods Miralax (over the counter) for constipation as needed.    Itching:  If you experience itching with your medications, try taking only a single pain pill, or even half a pain pill at a time.  You may take up to 10 pain pills per day, and you can also use benadryl over the counter for itching or also to help with sleep.   Precautions:  If you experience chest pain or shortness of breath - call 911 immediately for transfer to the hospital emergency department!!  If you develop a fever greater that 101 F, purulent drainage from wound, increased redness or drainage from wound, or calf pain-Call [facility]                                              Follow- Up Appointment:  Please call for an appointment to be seen in 2 weeks at [facility]   Cellulitis Cellulitis is an infection of the skin and the tissue under the skin. The infected area is usually red and tender. This happens most often in the arms and lower legs. HOME CARE   Take your antibiotic medicine as told. Finish the medicine even if you start to feel better.  Keep the infected arm or leg raised (elevated).  Put a warm cloth on the area up to 4 times per day.  Only take medicines as told by your doctor.  Keep all doctor visits as told. GET HELP IF:  You see red streaks on the skin coming from the infected area.  Your red area gets bigger or turns a dark color.  Your bone or  joint under the infected area is painful after the skin heals.  Your infection comes back in the same area or different area.  You have a puffy (swollen) bump in the infected area.  You have new symptoms.  You have a fever. GET HELP RIGHT AWAY IF:   You feel very sleepy.  You throw up (vomit) or have watery poop (diarrhea).  You feel sick and have muscle aches and pains.   This information is not intended to replace advice given to you by your health care provider. Make sure you discuss any questions you have with your health care provider.   Document Released: 11/15/2007 Document Revised: 02/17/2015 Document Reviewed: 08/14/2011 Elsevier Interactive Patient Education [employer].

## 2015-06-02 NOTE — Care Management Important Message (Signed)
Important Message  Patient Details  Name: Amanda Robbins MRN: [id number] Date of Birth: 03-Jun-1941   Medicare Important Message Given:  Yes    [name] 06/02/2015, 1:19 PM

## 2015-06-02 NOTE — Progress Notes (Signed)
   Subjective: 2 Days Post-Op Procedure(s) (LRB): KYPHOPLASTY L-2 and L-4 (N/A) Patient is doing well after surgery. Pain has improved. Still with mild left lumbar muscular pain. No radicular symptoms. Able to tolerate therapy and move with less pain than prior to surgery.  Objective: Vital signs in last 24 hours: Temp:  [97.9 F (36.6 C)-98 F (36.7 C)] 97.9 F (36.6 C) (12/20 2149) Pulse Rate:  [87] 87 (12/20 2149) Resp:  [18] 18 (12/20 1443) BP: (102-118)/(56-68) 105/60 mmHg (12/20 2149) SpO2:  [99 %-100 %] 99 % (12/20 2149) Weight:  [75.388 kg (166 lb 3.2 oz)] 75.388 kg (166 lb 3.2 oz) (12/20 2149)  Intake/Output from previous day: 12/20 0701 - 12/21 0700 In: 1450 [I.V.:1450] Out: -  Intake/Output this shift: Total I/O In: 240 [P.O.:240] Out: -    Recent Labs  05/31/15 0846 06/01/15 0543  HGB 9.3* 9.5*    Recent Labs  05/31/15 0846 06/01/15 0543  WBC 4.1 3.4*  RBC 2.65* 2.66*  HCT 27.1* 27.1*  PLT 73* 75*   No results for input(s): NA, K, CL, CO2, BUN, CREATININE, GLUCOSE, CALCIUM in the last 72 hours.  Recent Labs  05/30/15 2319  INR 1.54    EXAM General - Patient is Alert, Appropriate and Oriented  Lumbar spine - bandages intact, no drainage. Minimal tenderness to percussion along spinous process. Left lumbar paravertebral muscle tenderness. Extremity - Neurovascular intact Sensation intact distally Intact pulses distally Dressing - dressing C/D/I Motor Function - intact, moving foot and toes well on exam.   Past Medical History  Diagnosis Date  . 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 Irritable bowel   . C. difficile colitis 2011  . Allergic rhinitis     Followed by Dr. Donneta Romberg  . Esophageal reflux     Followed by GI physician, on Dexilant and Ranitidine  . Migraine   . 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 Osteoporosis 2009    on Reclast once yearly, Followed by Dr. Jefm Bryant  . Ovarian cancer ([place])    . COPD (chronic obstructive pulmonary disease) ([place])   . Thrombocytopenia ([place])   . MVP (mitral valve prolapse)   . SVT (supraventricular tachycardia) ([facility])     Assessment/Plan:   2 Days Post-Op Procedure(s) (LRB): KYPHOPLASTY L-2 and L-4 (N/A) Principal Problem:   Sepsis ([place]) Active Problems:   Thrombocytopenia ([facility])   Ovarian cancer ([place])   Cellulitis of right lower extremity   Back pain   Cellulitis of right leg   Lumbar compression fracture ([facility])   Malnutrition of moderate degree  Estimated body mass index is 26.84 kg/(m^2) as calculated from the following:   Height as of this encounter: 5\' 6"  (1.676 m).   Weight as of this encounter: 75.388 kg (166 lb 3.2 oz).  Patient can progress activity as tolerated Continue with physical therapy Follow up with [facility] ortho in 2 weeks for xrays of lumbar spine   T. Rachelle Hora, PA-C [facility] 06/02/2015, 11:20 AM

## 2015-06-02 NOTE — Progress Notes (Signed)
Pt is alert and oriented x 4, husband at bedside, left back pain improved with tramadol x 1, using 2 l oxygen while in bed, up to bathroom with stand by assist, fair appetite, edema in lower extremities, pt is d/c to home, Rx for amoxocillin provided to patient, dose given prior to d/c, pt is on room air, denies need for pain medication prior to discharge, pt educated on d/c instructions, reports understanding of d.c instructions and has no further questiuons at this time. F/u appt with Dr. Rudene Christians on 1/6 for xray on back, pt is d/c to home via husband, pushed to visitor entrance via nursing staff, uneventful shift.

## 2015-06-02 NOTE — Care Management (Signed)
Discharge to home today per D. Sainani. Discussed home health agencies with Mr & Ms Wainer. States they would like to go with [facility]. They have had this company in the past.  Floydene Flock, [facility] representative updated.  Mr. Pecina will transport. Shelbie Ammons RN MSN CCM Care Management [phone]

## 2015-06-02 NOTE — Progress Notes (Signed)
Physical Therapy Treatment Patient Details Name: CLEVA PARKISON MRN: [id number] DOB: [birth year] Today's Date: 06/02/2015    History of Present Illness Jahzarah Lefever is a 74 y.o. 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 Patient states when she was trying to lift some weight 2 weeks ago she developed back pain which continued since then and worsened today. Denies any focal weakness or numbness, bladder or bowel disturbances. Denies any fever, chills, chest pain, nausea, vomiting, diarrhea, dysuria, abdominal pain. On arrival to the ED, patient was noted to be hypotensive with blood pressure 79/45 and tachycardic with heart rate of 115 and temperature of 97 degrees Fahrenheit. She was also noted to have redness and swelling of right lower extremity with increased local warmth. Lab work revealed WBC of 3.9, potassium 3.4 and lactate 3.1 and platelets 92K which is chronic. Urinalysis WBC 6-30 with trace LE. CT of the chest abdomen and the pelvis reported negative for PE/ infilltrate, acute intra-abdominal pathology but L2 and L4 compression fractures. She was given 2 L of IV fluids following which her blood pressure improved and currently is maintaining around 122/65. Heart rate also improved and currently maintaining running around 100-105. After obtaining blood cultures, patient was started on IV vancomycin with the diagnosis of sepsis due to cellulitis and hospitalist service was consulted for further management. Ortho consulted and is considering kyphoplasty pending input from oncology. Prior to injury pt reports independence with ADLs but assist for IADLs. Pt uses a rollator for mobility and denies falls in the last 12 months.     PT Comments    Pt/family note pt is discharging today, but pt wishes PT to assist in ambulating to/from bathroom. Pt  demonstrates much improvement with ease and ability with all bed mobility, transfers and ambulation today. Steps not performed; pt notes significant family support/care to assist in/out of home and home is one level. Pt/family education provided regarding management with lower extremity swelling and appropriate sock wear. Pt to discharge home with home health PT today.   Follow Up Recommendations  Home health PT;Supervision - Intermittent     Equipment Recommendations  None recommended by PT    Recommendations for Other Services       Precautions / Restrictions Precautions Precautions: Fall Restrictions Weight Bearing Restrictions: No    Mobility  Bed Mobility Overal bed mobility: Needs Assistance Bed Mobility: Supine to Sit;Sit to Supine     Supine to sit: Modified independent (Device/Increase time) Sit to supine: Min assist (very minimal for LEs)   General bed mobility comments: very minimal assist for LEs for return and assist of 2 to reposition upward in bed. Improved ease and speed today for log roll technique  Transfers Overall transfer level: Needs assistance Equipment used: Rolling walker (2 wheeled) Transfers: Sit to/from Stand Sit to Stand: Min guard         General transfer comment: Improved ease and speed with sit to stand although continues limited  Ambulation/Gait Ambulation/Gait assistance: Min guard Ambulation Distance (Feet): 25 Feet (performed 2x) Assistive device: Rolling walker (2 wheeled) Gait Pattern/deviations: WFL(Within Functional Limits) Gait velocity: Decreased   General Gait Details: Improved speed, fluidity and posture with ambulation today   Stairs            Wheelchair Mobility    Modified Rankin (Stroke Patients Only)  Balance                                    Cognition Arousal/Alertness: Awake/alert Behavior During Therapy: WFL for tasks assessed/performed Overall Cognitive Status: Within  Functional Limits for tasks assessed                      Exercises Other Exercises Other Exercises: stand tolerance with supervision for donning undergarment and handwashing post toileting    General Comments        Pertinent Vitals/Pain Pain Assessment: 0-10 Pain Score: 5  Pain Location: L back (pt states it feels muscular in nature) Pain Descriptors / Indicators: Aching Pain Intervention(s): Premedicated before session    Home Living                      Prior Function            PT Goals (current goals can now be found in the care plan section) Progress towards PT goals: Progressing toward goals    Frequency  7X/week    PT Plan Current plan remains appropriate    Co-evaluation             End of Session   Activity Tolerance: Patient tolerated treatment well;No increased pain (much improved with overall functional mobility ) Patient left: in bed;with call bell/phone within reach;with family/visitor present (nursing notified, as nsg ready to prepare for discharge)     Time: 1310-1331 PT Time Calculation (min) (ACUTE ONLY): 21 min  Charges:  $Gait Training: 8-22 mins                    G Codes:      Charlaine Dalton 06/02/2015, 1:49 PM

## 2015-06-02 NOTE — Discharge Summary (Signed)
[facility] NAME: Amanda Robbins    MR#:  [id number]  DATE OF BIRTH:  1941/05/30  DATE OF ADMISSION:  05/29/2015 ADMITTING PHYSICIAN: Juluis Mire, MD  DATE OF DISCHARGE: 06/02/2015  2:22 PM  PRIMARY CARE PHYSICIAN: Rica Mast, MD    ADMISSION DIAGNOSIS:  Cellulitis of right leg [W09.811] Sepsis, due to unspecified organism ([place]) [A41.9]  DISCHARGE DIAGNOSIS:  Principal Problem:   Sepsis ([place]) Active Problems:   Thrombocytopenia (HCC)   Ovarian cancer ([place])   Cellulitis of right lower extremity   Back pain   Cellulitis of right leg   Lumbar compression fracture (HCC)   Malnutrition of moderate degree   SECONDARY DIAGNOSIS:   Past Medical History  Diagnosis Date  . 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 Irritable bowel   . C. difficile colitis 2011  . Allergic rhinitis     Followed by Dr. Donneta Romberg  . Esophageal reflux     Followed by GI physician, on Dexilant and Ranitidine  . Migraine   . 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 Osteoporosis 2009    on Reclast once yearly, Followed by Dr. Jefm Bryant  . Ovarian cancer ([place])   . COPD (chronic obstructive pulmonary disease) ([place])   . Thrombocytopenia ([place])   . MVP (mitral valve prolapse)   . SVT (supraventricular tachycardia) [facility])     HOSPITAL COURSE:   74 year old female with past medical history of metastatic ovarian cancer, chronic anemia/thrombocytopenia, GERD, COPD, history of SVT, history of mitral valve prolapse, who presented to the hospital due to back pain and also noted to have sepsis secondary cellulitis.  #1 back pain-patient presented with acute back pain after lifting something heavy 2 weeks ago. She underwent a MRI of her lumbar spine which showed compression fractures in the L2-L4 area. -An orthopedic consult was obtained and patient was seen by Dr. Rudene Christians were recommended kyphoplasty. Patient  underwent kyphoplasty and is currently postop day #2. Her back pain has improved and she was seen by physical therapy and recommended home health services which is being arranged for her prior to discharge. -She will follow-up with orthopedics as outpatient and continue tramadol and Tylenol as needed for pain.  #2 sepsis-patient met criteria on admission given her hypotension/tachycardia and elevated lactic acid. -Initially this was thought to be secondary to a right lower extremity cellulitis but patient's urine culture and blood cultures were consistent with enterococcus.  Patient initially was on IV ceftriaxone and then narrowed down to oral amoxicillin is being discharged on that for the enterococcal sepsis. -We'll further right lower extremity cellulitis patient was on IV vancomycin but now being discharged on oral doxycycline as that has clinically improved 2. - repeat BC from 12/19 are (-) so far.   #3 right lower extremity cellulitis - was on IV Vanco but switched to Oral Doxycycline upon discharge. Currently much improved now.   #4 history of PE-patient is on Eliquis and it's on hold due to patient's recent kyphoplasty. -Discussed with patient's oncologist and advised the patient to resume anticoagulation in the next 2-3 days.  #5 chronic anemia/thrombocytopenia-due to underlying malignancy and patient getting chemotherapy. -Patient's counts remain stable while in the hospital and she will continue follow-up at the [facility] as an outpatient.   #6 Ovarian Cancer w/ mets - appreciate oncology input and will continue follow-up with the patient after discharge. - She will cont. Decadron.   #  7 GERD - patient will cont. Protonix.   She was discharged home with home health nursing and physical therapy services.  DISCHARGE CONDITIONS:   Stable  CONSULTS OBTAINED:  Treatment Team:  Lequita Asal, MD Hessie Knows, MD Adrian Prows, MD  DRUG ALLERGIES:   Allergies   Allergen Reactions  . Aspirin     Other reaction(s): Localized superficial swelling of skin uncoated asa-causes throat to swell up  . Codeine Itching    Other reaction(s): Itching of Skin  . Fosamax  [Alendronate Sodium]     Other reaction(s): Distress (finding)  . Ibuprofen   . Ibuprofen     Other reaction(s): Distress (finding)  . Metronidazole Nausea And Vomiting  . Naproxen     Other reaction(s): Distress (finding)  . Naproxen Sodium   . Nsaids     GI UPSET / GERD  . Oxycodone     Other reaction(s): Diarrhea and vomiting (finding)  . Oxycodone-Acetaminophen     Other reaction(s): Diarrhea and vomiting (finding)    DISCHARGE MEDICATIONS:   Discharge Medication List as of 06/02/2015 12:44 PM    START taking these medications   Details  amoxicillin (AMOXIL) 500 MG capsule Take 1 capsule (500 mg total) by mouth every 8 (eight) hours., Starting 06/02/2015, Until Mon 06/14/15, Print    cyclobenzaprine (FLEXERIL) 5 MG tablet Take 1 tablet (5 mg total) by mouth 3 (three) times daily as needed for muscle spasms., Starting 06/02/2015, Until Discontinued, Print    doxycycline (VIBRA-TABS) 100 MG tablet Take 1 tablet (100 mg total) by mouth every 12 (twelve) hours., Starting 06/02/2015, Until Discontinued, Print      CONTINUE these medications which have NOT CHANGED   Details  Acetaminophen 500 MG coapsule Take 1,000 mg by mouth every 6 (six) hours as needed for fever., Until Discontinued, Historical Med    Calcium Carbonate (CALCARB 600 PO) Take 600 mg by mouth 2 (two) times daily., Until Discontinued, Historical Med    dexamethasone (DECADRON) 4 MG tablet Take 1 tablet (4 mg total) by mouth daily., Starting 04/13/2015, Until Discontinued, Normal    DimenhyDRINATE (DRAMAMINE PO) Take 1 tablet by mouth daily as needed (motion sickness.). , Until Discontinued, Historical Med    ELIQUIS 5 MG TABS tablet take 1 tablet by mouth twice a day, Normal    furosemide (LASIX) 20 MG tablet  take 1 tablet by mouth once daily, Normal    Multiple Vitamin (MULTIVITAMIN) tablet Take 1 tablet by mouth daily.  , Until Discontinued, Historical Med    naproxen sodium (ALEVE) 220 MG tablet Take 220 mg by mouth 2 (two) times daily as needed (for pain.)., Until Discontinued, Historical Med    omeprazole (PRILOSEC) 20 MG capsule Take 20 mg by mouth 2 (two) times daily before a meal., Until Discontinued, Historical Med    potassium chloride SA (K-DUR,KLOR-CON) 20 MEQ tablet take 1 tablet by mouth twice a day, Normal    Probiotic Product (PRO-BIOTIC BLEND) CAPS Take 1 capsule by mouth daily., Until Discontinued, Historical Med    topiramate (TOPAMAX) 25 MG tablet TAKE 1-2 TABLETS BY MOUTH DAILY, Normal    traMADol (ULTRAM) 50 MG tablet Take 1 tablet (50 mg total) by mouth every 6 (six) hours as needed., Starting 03/16/2015, Until Discontinued, Print    zolpidem (AMBIEN) 5 MG tablet Take 1 tablet (5 mg total) by mouth at bedtime as needed for sleep. 1/2-1 tab, Starting 04/20/2015, Until Discontinued, Print    Diphenhyd-Hydrocort-Nystatin (FIRST-DUKES MOUTHWASH) SUSP Use as directed  5 mLs in the mouth or throat 4 (four) times daily as needed., Starting 03/25/2015, Until Discontinued, Normal    phenazopyridine (PYRIDIUM) 200 MG tablet Take 1 tablet (200 mg total) by mouth 3 (three) times daily as needed for pain., Starting 04/05/2015, Until Discontinued, Normal         DISCHARGE INSTRUCTIONS:   DIET:  Regular diet  DISCHARGE CONDITION:  Stable  ACTIVITY:  Activity as tolerated  OXYGEN:  Home Oxygen: No.   Oxygen Delivery: room air  DISCHARGE LOCATION:  Home with home health nursing and physical therapy.   If you experience worsening of your admission symptoms, develop shortness of breath, life threatening emergency, suicidal or homicidal thoughts you must seek medical attention immediately by calling 911 or calling your MD immediately  if symptoms less severe.  You Must read  complete instructions/literature along with all the possible adverse reactions/side effects for all the Medicines you take and that have been prescribed to you. Take any new Medicines after you have completely understood and accpet all the possible adverse reactions/side effects.   Please note  You were cared for by a hospitalist during your hospital stay. If you have any questions about your discharge medications or the care you received while you were in the hospital after you are discharged, you can call the unit and asked to speak with the hospitalist on call if the hospitalist that took care of you is not available. Once you are discharged, your primary care physician will handle any further medical issues. Please note that NO REFILLS for any discharge medications will be authorized once you are discharged, as it is imperative that you return to your primary care physician (or establish a relationship with a primary care physician if you do not have one) for your aftercare needs so that they can reassess your need for medications and monitor your lab values.     Today   Back pain has improved. Having some muscle spasms on the lower back side. Right leg looks less red and inflamed. Afebrile, hemodynamically stable.  VITAL SIGNS:  Blood pressure 105/60, pulse 87, temperature 97.9 F (36.6 C), temperature source Oral, resp. rate 18, height _0  (1.676 m), weight 75.388 kg (166 lb 3.2 oz), SpO2 100 %.  I/O:   Intake/Output Summary (Last 24 hours) at 06/02/15 1540 Last data filed at 06/02/15 1300  Gross per 24 hour  Intake   2090 ml  Output      0 ml  Net   2090 ml    PHYSICAL EXAMINATION:   GENERAL: 74 y.o.-year-old patient lying in the bed with no acute distress.  EYES: Pupils equal, round, reactive to light and accommodation. No scleral icterus. Extraocular muscles intact.  HEENT: Head atraumatic, normocephalic. Oropharynx and nasopharynx clear.  NECK: Supple, no jugular  venous distention. No thyroid enlargement, no tenderness.  LUNGS: Normal breath sounds bilaterally, no wheezing, rales, rhonchi. No use of accessory muscles of respiration.  CARDIOVASCULAR: S1, S2 normal. No murmurs, rubs, or gallops.  ABDOMEN: Soft, nontender, nondistended. Bowel sounds present. No organomegaly or mass.  EXTREMITIES: No cyanosis, clubbing or edema b/l. RLE redness and warmth consistent w/ cellulitis but much improved. Marland Kitchen  NEUROLOGIC: Cranial nerves II through XII are intact. No focal Motor or sensory deficits b/l.  PSYCHIATRIC: The patient is alert and oriented x 3. Good affect.  SKIN: No obvious rash, lesion, or ulcer. RLE cellulitis much improved.   DATA REVIEW:   CBC  Recent Labs Lab 06/01/15 0543  WBC 3.4*  HGB 9.5*  HCT 27.1*  PLT 75*    Chemistries   Recent Labs Lab 05/29/15 1149 05/30/15 0430  NA 134* 134*  K 3.4* 3.2*  CL 103 103  CO2 26 24  GLUCOSE 95 105*  BUN 23* 16  CREATININE 0.91 0.66  CALCIUM 8.8* 7.9*  AST 33  --   ALT 32  --   ALKPHOS 65  --   BILITOT 1.3*  --     Cardiac Enzymes No results for input(s): TROPONINI in the last 168 hours.  Microbiology Results  Results for orders placed or performed during the hospital encounter of 05/29/15  Urine culture     Status: None   Collection Time: 05/29/15 11:49 AM  Result Value Ref Range Status   Specimen Description URINE, RANDOM  Final   Special Requests NONE  Final   Culture >=100,000 COLONIES/mL ENTEROCOCCUS FAECALIS  Final   Report Status 05/31/2015 FINAL  Final   Organism ID, Bacteria ENTEROCOCCUS FAECALIS  Final      Susceptibility   Enterococcus faecalis - MIC*    AMPICILLIN <=2 SENSITIVE Sensitive     LINEZOLID 2 SENSITIVE Sensitive     LEVOFLOXACIN Value in next row Sensitive      SENSITIVE2    CIPROFLOXACIN Value in next row Intermediate      INTERMEDIATE2    NITROFURANTOIN Value in next row Sensitive      SENSITIVE<=16    TETRACYCLINE Value in next row  Resistant      RESISTANT>=16    * >=100,000 COLONIES/mL ENTEROCOCCUS FAECALIS  Culture, blood (routine x 2)     Status: None   Collection Time: 05/29/15  3:20 PM  Result Value Ref Range Status   Specimen Description BLOOD LEFT ASSIST CONTROL  Final   Special Requests BOTTLES DRAWN AEROBIC AND ANAEROBIC 1 CC  Final   Culture  Setup Time   Final    GRAM POSITIVE COCCI IN BOTH AEROBIC AND ANAEROBIC BOTTLES CRITICAL RESULT CALLED TO, READ BACK BY AND VERIFIED WITH: Teressa Senter 05/30/2015 0733 BY JRS.    Culture   Final    ENTEROCOCCUS FAECALIS IN BOTH AEROBIC AND ANAEROBIC BOTTLES    Report Status 06/01/2015 FINAL  Final   Organism ID, Bacteria ENTEROCOCCUS FAECALIS  Final      Susceptibility   Enterococcus faecalis - MIC*    AMPICILLIN <=2 SENSITIVE Sensitive     LINEZOLID 2 SENSITIVE Sensitive     * ENTEROCOCCUS FAECALIS  Blood Culture ID Panel (Reflexed)     Status: Abnormal   Collection Time: 05/29/15  3:20 PM  Result Value Ref Range Status   Enterococcus species DETECTED (A) NOT DETECTED Final    Comment: CRITICAL RESULTS CALLED AND VERIFIED TO HANNA WANG 05/30/2015 0733 BY J [email or web address].   Listeria monocytogenes NOT DETECTED NOT DETECTED Final   Staphylococcus species NOT DETECTED NOT DETECTED Final   Staphylococcus aureus NOT DETECTED NOT DETECTED Final   Streptococcus species NOT DETECTED NOT DETECTED Final   Streptococcus agalactiae NOT DETECTED NOT DETECTED Final   Streptococcus pneumoniae NOT DETECTED NOT DETECTED Final   Streptococcus pyogenes NOT DETECTED NOT DETECTED Final   Acinetobacter baumannii NOT DETECTED NOT DETECTED Final   Enterobacteriaceae species NOT DETECTED NOT DETECTED Final   Enterobacter cloacae complex NOT DETECTED NOT DETECTED Final   Escherichia coli NOT DETECTED NOT DETECTED Final   Klebsiella oxytoca NOT DETECTED NOT DETECTED Final   Klebsiella pneumoniae NOT DETECTED NOT DETECTED Final   Proteus  species NOT DETECTED NOT DETECTED Final    Serratia marcescens NOT DETECTED NOT DETECTED Final   Haemophilus influenzae NOT DETECTED NOT DETECTED Final   Neisseria meningitidis NOT DETECTED NOT DETECTED Final   Pseudomonas aeruginosa NOT DETECTED NOT DETECTED Final   Candida albicans NOT DETECTED NOT DETECTED Final   Candida glabrata NOT DETECTED NOT DETECTED Final   Candida krusei NOT DETECTED NOT DETECTED Final   Candida parapsilosis NOT DETECTED NOT DETECTED Final   Candida tropicalis NOT DETECTED NOT DETECTED Final   Carbapenem resistance NOT DETECTED NOT DETECTED Final   Methicillin resistance NOT DETECTED NOT DETECTED Final   Vancomycin resistance NOT DETECTED NOT DETECTED Final  Culture, blood (routine x 2)     Status: None   Collection Time: 05/29/15  3:21 PM  Result Value Ref Range Status   Specimen Description BLOOD RIGHT ASSIST CONTROL  Final   Special Requests BOTTLES DRAWN AEROBIC AND ANAEROBIC 1 CC  Final   Culture  Setup Time   Final    GRAM POSITIVE COCCI IN BOTH AEROBIC AND ANAEROBIC BOTTLES CRITICAL VALUE NOTED.  VALUE IS CONSISTENT WITH PREVIOUSLY REPORTED AND CALLED VALUE.    Culture   Final    ENTEROCOCCUS FAECALIS IN BOTH AEROBIC AND ANAEROBIC BOTTLES    Report Status 06/01/2015 FINAL  Final   Organism ID, Bacteria ENTEROCOCCUS FAECALIS  Final      Susceptibility   Enterococcus faecalis - MIC*    AMPICILLIN <=2 SENSITIVE Sensitive     LINEZOLID 2 SENSITIVE Sensitive     * ENTEROCOCCUS FAECALIS  CULTURE, BLOOD (ROUTINE X 2) w Reflex to PCR ID Panel     Status: None (Preliminary result)   Collection Time: 05/31/15  2:46 PM  Result Value Ref Range Status   Specimen Description BLOOD RIGHT HAND  Final   Special Requests BOTTLES DRAWN AEROBIC AND ANAEROBIC 2CCAERO,2CCANA  Final   Culture NO GROWTH 2 DAYS  Final   Report Status PENDING  Incomplete  CULTURE, BLOOD (ROUTINE X 2) w Reflex to PCR ID Panel     Status: None (Preliminary result)   Collection Time: 05/31/15  2:47 PM  Result Value Ref Range  Status   Specimen Description BLOOD LEFT WRIST  Final   Special Requests BOTTLES DRAWN AEROBIC AND ANAEROBIC 2CCAERO,2CCANA  Final   Culture NO GROWTH 2 DAYS  Final   Report Status PENDING  Incomplete    RADIOLOGY:  Dg Lumbar Spine 2-3 Views  05/31/2015  CLINICAL DATA:  Two level kyphoplasty. EXAM: LUMBAR SPINE - 2-3 VIEW; DG C-ARM 61-120 MIN COMPARISON:  MRI 05/30/2015 FINDINGS: Examination demonstrates normal vertebral body alignment. There is evidence of patient's recent kyphoplasty of L2 and L4. Injected radiopaque material remains within the confines of the vertebral body borders. Recommend correlation with findings at the time of the procedure. IMPRESSION: Post kyphoplasty of L2 and L4. Electronically Signed   By: Marin Olp M.D.   On: 05/31/2015 19:27   Dg C-arm 1-60 Min  05/31/2015  CLINICAL DATA:  Two level kyphoplasty. EXAM: LUMBAR SPINE - 2-3 VIEW; DG C-ARM 61-120 MIN COMPARISON:  MRI 05/30/2015 FINDINGS: Examination demonstrates normal vertebral body alignment. There is evidence of patient's recent kyphoplasty of L2 and L4. Injected radiopaque material remains within the confines of the vertebral body borders. Recommend correlation with findings at the time of the procedure. IMPRESSION: Post kyphoplasty of L2 and L4. Electronically Signed   By: Marin Olp M.D.   On: 05/31/2015 19:27  Management plans discussed with the patient, family and they are in agreement.  CODE STATUS:     Code Status Orders        Start     Ordered   05/31/15 2022  Full code   Continuous     05/31/15 2021      TOTAL TIME TAKING CARE OF THIS PATIENT: 40 minutes.    Henreitta Leber M.D on 06/02/2015 at 3:40 PM  Between 7am to 6pm - Pager - [phone]  After 6pm go to [email or web address] - password [email or web address] [facility]  Office  [phone]  CC: Primary care physician; Rica Mast, MD

## 2015-06-02 NOTE — Progress Notes (Signed)
Patient feeling better with back, much improved today. Will have her follow up in 2 weeks for xray

## 2015-06-03 ENCOUNTER — Telehealth: Payer: Self-pay

## 2015-06-03 NOTE — Telephone Encounter (Signed)
Transition Care Management Follow-up Telephone Call  Date discharged? 06/02/15   How have you been since you were released from the hospital? Back pain has improved but still present.  No muscle spasms.  Raised toilet seat in use. Lessened redness in R leg with no drainage. Denies nausea and vomiting.    Do you understand why you were in the hospital? Yes, cellulitis of my right leg, kyphoplasty and was told of sepsis.   Do you understand the discharge instructions? Yes and home health nursing and physical therapy services are assisting.   Where were you discharged to? Home.   Items Reviewed:  Medications reviewed: Yes, continuing Tramadol and Tylenol as needed for pain.  Started Amoxicillin, Flexeril and Vibra-Tabs.  Continuing all other scheduled medications as prescribed.  Allergies reviewed: Yes, no changes  Dietary changes reviewed: Yes, regular diet, no problems  Referrals reviewed: Yes, following up with orthopedics and oncology in January.   Functional Questionnaire:   Activities of Daily Living (ADLs):   She states they are independent in the following: Toileting, self feeding, grooming. States they require assistance with the following: Ambulating (uses walker), bathing, dressing, meal prep [facility], Physical Therapy assists).   Any transportation issues/concerns?: No, husband will drive.   Any patient concerns? None at this time.   Confirmed importance and date/time of follow-up visits scheduled Yes, appointment scheduled 06/10/15 at 1:00.    Provider Appointment booked with Dr. Gilford Rile (PCP).  Confirmed with patient if condition begins to worsen call PCP or go to the ER.  Patient was given the office number and encouraged to call back with question or concerns.  : Yes, patient verbalized understanding.

## 2015-06-04 ENCOUNTER — Other Ambulatory Visit: Payer: Self-pay

## 2015-06-04 ENCOUNTER — Telehealth: Payer: Self-pay | Admitting: *Deleted

## 2015-06-04 ENCOUNTER — Ambulatory Visit: Payer: Medicare Other | Admitting: Internal Medicine

## 2015-06-04 MED ORDER — FLUCONAZOLE 100 MG PO TABS: 100.0000 mg | ORAL_TABLET | Freq: Every day | ORAL | Status: DC

## 2015-06-04 NOTE — Telephone Encounter (Signed)
Asking for the pill Dr Grayland Ormond discussed for thrush. The liquid is doing anything for it. Uses [facility]

## 2015-06-04 NOTE — Telephone Encounter (Signed)
Notified Of rx being E scribed

## 2015-06-04 NOTE — Telephone Encounter (Signed)
fluconzole pills will be fine, thank you.

## 2015-06-05 LAB — CULTURE, BLOOD (ROUTINE X 2)
Culture: NO GROWTH
Culture: NO GROWTH

## 2015-06-08 ENCOUNTER — Ambulatory Visit (HOSPITAL_COMMUNITY): Payer: Medicare Other

## 2015-06-10 ENCOUNTER — Ambulatory Visit (INDEPENDENT_AMBULATORY_CARE_PROVIDER_SITE_OTHER): Payer: Medicare Other | Admitting: Internal Medicine

## 2015-06-10 ENCOUNTER — Telehealth: Payer: Self-pay | Admitting: *Deleted

## 2015-06-10 ENCOUNTER — Encounter: Payer: Self-pay | Admitting: Internal Medicine

## 2015-06-10 VITALS — BP 99/65 | HR 77 | Temp 97.9°F | Ht 65.75 in | Wt 127.2 lb

## 2015-06-10 DIAGNOSIS — L03115 Cellulitis of right lower limb: Secondary | ICD-10-CM

## 2015-06-10 DIAGNOSIS — S32020D Wedge compression fracture of second lumbar vertebra, subsequent encounter for fracture with routine healing: Secondary | ICD-10-CM

## 2015-06-10 DIAGNOSIS — A419 Sepsis, unspecified organism: Secondary | ICD-10-CM

## 2015-06-10 DIAGNOSIS — S32000S Wedge compression fracture of unspecified lumbar vertebra, sequela: Secondary | ICD-10-CM

## 2015-06-10 DIAGNOSIS — A4181 Sepsis due to Enterococcus: Secondary | ICD-10-CM

## 2015-06-10 DIAGNOSIS — S32030D Wedge compression fracture of third lumbar vertebra, subsequent encounter for fracture with routine healing: Secondary | ICD-10-CM

## 2015-06-10 DIAGNOSIS — S32040D Wedge compression fracture of fourth lumbar vertebra, subsequent encounter for fracture with routine healing: Secondary | ICD-10-CM

## 2015-06-10 LAB — CBC WITH DIFFERENTIAL/PLATELET
Basophils Absolute: 0 10*3/uL (ref 0.0–0.1)
Basophils Relative: 0 % (ref 0.0–3.0)
Eosinophils Absolute: 0 10*3/uL (ref 0.0–0.7)
Eosinophils Relative: 0 % (ref 0.0–5.0)
HCT: 34.8 % — ABNORMAL LOW (ref 36.0–46.0)
HEMOGLOBIN: 11.4 g/dL — AB (ref 12.0–15.0)
LYMPHS ABS: 0.7 10*3/uL (ref 0.7–4.0)
LYMPHS PCT: 9.3 % — AB (ref 12.0–46.0)
MCHC: 32.7 g/dL (ref 30.0–36.0)
MCV: 106.1 fl — AB (ref 78.0–100.0)
MONOS PCT: 1.6 % — AB (ref 3.0–12.0)
Monocytes Absolute: 0.1 10*3/uL (ref 0.1–1.0)
Neutro Abs: 6.6 10*3/uL (ref 1.4–7.7)
Neutrophils Relative %: 89.1 % — ABNORMAL HIGH (ref 43.0–77.0)
Platelets: 169 10*3/uL (ref 150.0–400.0)
RBC: 3.28 Mil/uL — AB (ref 3.87–5.11)
RDW: 18.6 % — ABNORMAL HIGH (ref 11.5–15.5)
WBC: 7.4 10*3/uL (ref 4.0–10.5)

## 2015-06-10 LAB — COMPREHENSIVE METABOLIC PANEL
ALK PHOS: 124 U/L — AB (ref 39–117)
ALT: 25 U/L (ref 0–35)
AST: 24 U/L (ref 0–37)
Albumin: 2.9 g/dL — ABNORMAL LOW (ref 3.5–5.2)
BILIRUBIN TOTAL: 0.7 mg/dL (ref 0.2–1.2)
BUN: 26 mg/dL — ABNORMAL HIGH (ref 6–23)
CALCIUM: 9 mg/dL (ref 8.4–10.5)
CO2: 23 mEq/L (ref 19–32)
Chloride: 104 mEq/L (ref 96–112)
Creatinine, Ser: 0.85 mg/dL (ref 0.40–1.20)
GFR: 69.7 mL/min (ref >60.00)
Glucose, Bld: 114 mg/dL — ABNORMAL HIGH (ref 70–99)
Potassium: 4.6 mEq/L (ref 3.5–5.1)
Sodium: 135 mEq/L (ref 135–145)
TOTAL PROTEIN: 5.3 g/dL — AB (ref 6.0–8.3)

## 2015-06-10 NOTE — Assessment & Plan Note (Signed)
S/p kyphoplasty. Pain well controlled with Tramadol and Tylenol. Follow up with ortho as scheduled.

## 2015-06-10 NOTE — Progress Notes (Signed)
Pre visit review using our clinic review tool, if applicable. No additional management support is needed unless otherwise documented below in the visit note. 

## 2015-06-10 NOTE — Telephone Encounter (Signed)
Patient is requesting Prolia injection.  Patient states that she spoke with someone in our office several weeks ago requesting for this to be in process, however no indication of this in her chart.  If there is anyway to please rush this I would greatly appreciate it.  Please notify our office once approval from insurance is complete.   Thank you

## 2015-06-10 NOTE — Patient Instructions (Signed)
Call if any questions or concerns.  Ask for Almyra Free or Lorriane Shire.

## 2015-06-10 NOTE — Assessment & Plan Note (Addendum)
Reviewed notes from recent hospitalization. Will continue Amoxicillin to complete course. Call and RTC if any fever, chills. Repeat CBC and CMP today.

## 2015-06-10 NOTE — Assessment & Plan Note (Signed)
Cellulitis appears to be improving. Continue PT for lymphedema. Discussed massage and compression. Complete course of Amoxicillin.

## 2015-06-10 NOTE — Progress Notes (Signed)
Subjective:    Patient ID: Amanda VANWEELDEN, female    DOB: 1941/05/30, 74 y.o.   MRN: [id number]  HPI  74YO female presents for hospital follow up.  ADMITTED: 05/29/2015 DISCHARGED: 06/02/2015  DIAGNOSIS: Cellulitis right leg, Sepsis  Initially presented with acute back pain after lifting a heavy object. Found to have compression fractures of L2-4. Underwent kyphoplasty. Noted to be hypotensive on admission. Blood and urine cultures pos for Enterococcus. Treated with Ceftriaxone then narrowed to amoxicillin.  Pain in back has improved. Taking only Tramadol and Tylenol for pain. Follow up with orthopedics 1/6.  Continues on Amoxicillin for Enterococcous.  No fever at home.  Having some trouble sleeping while on Dexamethasone. Taking occasional Ambien at night to help with sleep.  PET-CT scheduled with follow up 1/18 with Dr. Grayland Ormond.  Notes some redness over eyes and clear drainage from eyes last few weeks. No purulent exudate. No visual changes. Using Visine with some improvement.  PT in place in home. Strength improving. Able to get in and out of bed.   Wt Readings from Last 3 Encounters:  06/10/15 127 lb 4 oz (57.72 kg)  06/01/15 166 lb 3.2 oz (75.388 kg)  05/25/15 125 lb 14.1 oz (57.1 kg)   BP Readings from Last 3 Encounters:  06/10/15 99/65  06/01/15 105/60  05/25/15 99/68    Past Medical History  Diagnosis Date  . 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 Irritable bowel   . C. difficile colitis 2011  . Allergic rhinitis     Followed by Dr. Donneta Romberg  . Esophageal reflux     Followed by GI physician, on Dexilant and Ranitidine  . Migraine   . 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 Osteoporosis 2009    on Reclast once yearly, Followed by Dr. Jefm Bryant  . Ovarian cancer ([place])   . COPD (chronic obstructive pulmonary disease) ([place])   . Thrombocytopenia ([place])   . MVP (mitral valve prolapse)   . SVT (supraventricular tachycardia)  (HCC)    Family History  Problem Relation Age of Onset  . Ovarian cancer Maternal Grandmother 22  . Breast cancer Maternal Aunt 70   Past Surgical History  Procedure Laterality Date  . Bil roken wrists    . Gallbladder removed    . Bilateral bunionectomies    . Cesarean section    . Abdominal hysterectomy    . Cholecystectomy    . Kyphoplasty N/A 05/31/2015    Procedure: KYPHOPLASTY L-2 and L-4;  Surgeon: Hessie Knows, MD;  Location: [facility] ORS;  Service: Orthopedics;  Laterality: N/A;   Social History   Social History  . Marital Status: Married    Spouse Name: N/A  . Number of Children: N/A  . Years of Education: N/A   Social History Main Topics  . Smoking status: Former Smoker    Quit date: 06/12/1989  . Smokeless tobacco: Never Used  . Alcohol Use: No  . Drug Use: No  . Sexual Activity: Not Asked   Other Topics Concern  . None   Social History Narrative    Review of Systems  Constitutional: Positive for fatigue. Negative for fever, chills, appetite change and unexpected weight change.  Eyes: Negative for visual disturbance.  Respiratory: Positive for cough. Negative for chest tightness and shortness of breath.   Cardiovascular: Positive for leg swelling. Negative for chest pain.  Gastrointestinal: Negative for nausea, vomiting, abdominal pain, diarrhea and constipation.  Musculoskeletal: Positive for  myalgias, back pain and arthralgias.  Skin: Negative for color change and rash.  Neurological: Positive for weakness.  Hematological: Negative for adenopathy. Does not bruise/bleed easily.  Psychiatric/Behavioral: Negative for dysphoric mood. The patient is not nervous/anxious.        Objective:    BP 99/65 mmHg  Pulse 77  Temp(Src) 97.9 F (36.6 C) (Oral)  Ht 5' 5.75" (1.67 m)  Wt 127 lb 4 oz (57.72 kg)  BMI 20.70 kg/m2  SpO2 99% Physical Exam  Constitutional: She is oriented to person, place, and time. She appears well-developed and well-nourished. No  distress.  HENT:  Head: Normocephalic and atraumatic.  Right Ear: External ear normal.  Left Ear: External ear normal.  Nose: Nose normal.  Mouth/Throat: Oropharynx is clear and moist. No oropharyngeal exudate.  Eyes: Conjunctivae are normal. Pupils are equal, round, and reactive to light. Right eye exhibits no discharge. Left eye exhibits no discharge. No scleral icterus.  Neck: Normal range of motion. Neck supple. No tracheal deviation present. No thyromegaly present.  Cardiovascular: Normal rate, regular rhythm, normal heart sounds and intact distal pulses.  Exam reveals no gallop and no friction rub.   No murmur heard. Pulmonary/Chest: Effort normal and breath sounds normal. No accessory muscle usage. No tachypnea. No respiratory distress. She has no wheezes. She has no rhonchi. She has no rales. She exhibits no tenderness.  Musculoskeletal: Normal range of motion. She exhibits edema (diffuse pitting edema to mid thigh bilaterally, slightly more erythema right lower leg versus left, no induration). She exhibits no tenderness.  Lymphadenopathy:    She has no cervical adenopathy.  Neurological: She is alert and oriented to person, place, and time. No cranial nerve deficit. She exhibits normal muscle tone. Coordination normal.  Skin: Skin is warm and dry. No rash noted. She is not diaphoretic. There is erythema. No pallor.  Psychiatric: She has a normal mood and affect. Her behavior is normal. Judgment and thought content normal.          Assessment & Plan:   Problem List Items Addressed This Visit      Unprioritized   Cellulitis of right lower extremity    Cellulitis appears to be improving. Continue PT for lymphedema. Discussed massage and compression. Complete course of Amoxicillin.      Lumbar compression fracture (HCC)    S/p kyphoplasty. Pain well controlled with Tramadol and Tylenol. Follow up with ortho as scheduled.      Sepsis ([place]) - Primary    Reviewed notes from  recent hospitalization. Will continue Amoxicillin to complete course. Call and RTC if any fever, chills. Repeat CBC and CMP today.      Relevant Orders   Comprehensive metabolic panel   CBC with Differential/Platelet       Return in about 4 weeks (around 07/08/2015) for Recheck.

## 2015-06-15 ENCOUNTER — Other Ambulatory Visit: Payer: Self-pay | Admitting: Oncology

## 2015-06-16 ENCOUNTER — Telehealth: Payer: Self-pay | Admitting: *Deleted

## 2015-06-16 NOTE — Telephone Encounter (Signed)
I have electronically submitted pt's info for [employer] insurance verification and will notify you once I have a response. Thank you. °

## 2015-06-16 NOTE — Telephone Encounter (Signed)
Order faxed to lymphedema treatment program at [facility] on 06/16/15 @ 3:09..hs

## 2015-06-16 NOTE — Telephone Encounter (Signed)
The patient is requesting to be re-referred to the [facility] clinic. She states that she continues to have "lower extremity swelling not relieved with diuretics or with elevation. I am using the massage techniques and my compression stockings but the swelling has not improved." She previously went to [facility] location to get a sooner apt, but would prefer to meet locally with the [facility] clinic for this new referral.

## 2015-06-17 NOTE — Telephone Encounter (Signed)
Spoke with the patient, I will call her the first of next week to schedule an appt to receive injection. Ordered in Dimension 21.  thanks

## 2015-06-17 NOTE — Telephone Encounter (Signed)
I have rec'd pt's insurance verification for Prol[employer] and she will have an estimated responsibility of $0.  Please make pt aware this is an estimate and we will not know and exact amt until insurance(s) has/have paid.  I have sent a copy of the summary of benefits to be scanned into pt's chart.    Once pt recs injection, please let me know actual injection date so I can update the [employer] portal.  If you have any questions, please let me know.  Thank you.

## 2015-06-18 NOTE — Telephone Encounter (Signed)
In fridge

## 2015-06-27 ENCOUNTER — Other Ambulatory Visit: Payer: Self-pay | Admitting: Oncology

## 2015-06-28 ENCOUNTER — Encounter
Admission: RE | Admit: 2015-06-28 | Discharge: 2015-06-28 | Disposition: A | Discharge status: Home / Self Care | Payer: Medicare Other | Source: Ambulatory Visit | Attending: Oncology | Admitting: Oncology

## 2015-06-28 DIAGNOSIS — C569 Malignant neoplasm of unspecified ovary: Secondary | ICD-10-CM | POA: Diagnosis not present

## 2015-06-28 DIAGNOSIS — C7931 Secondary malignant neoplasm of brain: Secondary | ICD-10-CM | POA: Diagnosis not present

## 2015-06-28 DIAGNOSIS — L03115 Cellulitis of right lower limb: Secondary | ICD-10-CM | POA: Diagnosis not present

## 2015-06-28 DIAGNOSIS — M8008XD Age-related osteoporosis with current pathological fracture, vertebra(e), subsequent encounter for fracture with routine healing: Secondary | ICD-10-CM

## 2015-06-28 LAB — GLUCOSE, CAPILLARY: GLUCOSE-CAPILLARY: 76 mg/dL (ref 65–99)

## 2015-06-28 MED ORDER — FLUDEOXYGLUCOSE F - 18 (FDG) INJECTION
11.9500 | Freq: Once | INTRAVENOUS | Status: AC | PRN
  Administered 2015-06-28: 11.95 via INTRAVENOUS

## 2015-06-30 ENCOUNTER — Inpatient Hospital Stay: Payer: Medicare Other | Attending: Oncology | Admitting: Oncology

## 2015-06-30 VITALS — BP 96/65 | HR 98 | Temp 98.6°F | Resp 16 | Wt 130.5 lb

## 2015-06-30 DIAGNOSIS — Z9889 Other specified postprocedural states: Secondary | ICD-10-CM | POA: Diagnosis not present

## 2015-06-30 DIAGNOSIS — Z7901 Long term (current) use of anticoagulants: Secondary | ICD-10-CM | POA: Diagnosis not present

## 2015-06-30 DIAGNOSIS — R600 Localized edema: Secondary | ICD-10-CM | POA: Insufficient documentation

## 2015-06-30 DIAGNOSIS — C569 Malignant neoplasm of unspecified ovary: Secondary | ICD-10-CM

## 2015-06-30 DIAGNOSIS — R5381 Other malaise: Secondary | ICD-10-CM | POA: Insufficient documentation

## 2015-06-30 DIAGNOSIS — M549 Dorsalgia, unspecified: Secondary | ICD-10-CM | POA: Insufficient documentation

## 2015-06-30 DIAGNOSIS — K219 Gastro-esophageal reflux disease without esophagitis: Secondary | ICD-10-CM | POA: Diagnosis not present

## 2015-06-30 DIAGNOSIS — Z9221 Personal history of antineoplastic chemotherapy: Secondary | ICD-10-CM | POA: Insufficient documentation

## 2015-06-30 DIAGNOSIS — G629 Polyneuropathy, unspecified: Secondary | ICD-10-CM | POA: Insufficient documentation

## 2015-06-30 DIAGNOSIS — D696 Thrombocytopenia, unspecified: Secondary | ICD-10-CM | POA: Diagnosis not present

## 2015-06-30 DIAGNOSIS — J449 Chronic obstructive pulmonary disease, unspecified: Secondary | ICD-10-CM | POA: Insufficient documentation

## 2015-06-30 DIAGNOSIS — I341 Nonrheumatic mitral (valve) prolapse: Secondary | ICD-10-CM | POA: Diagnosis not present

## 2015-06-30 DIAGNOSIS — R197 Diarrhea, unspecified: Secondary | ICD-10-CM

## 2015-06-30 DIAGNOSIS — M810 Age-related osteoporosis without current pathological fracture: Secondary | ICD-10-CM | POA: Diagnosis not present

## 2015-06-30 DIAGNOSIS — Z87891 Personal history of nicotine dependence: Secondary | ICD-10-CM | POA: Diagnosis not present

## 2015-06-30 DIAGNOSIS — R531 Weakness: Secondary | ICD-10-CM | POA: Insufficient documentation

## 2015-06-30 DIAGNOSIS — C7931 Secondary malignant neoplasm of brain: Secondary | ICD-10-CM | POA: Diagnosis not present

## 2015-06-30 DIAGNOSIS — R5383 Other fatigue: Secondary | ICD-10-CM | POA: Diagnosis not present

## 2015-06-30 DIAGNOSIS — Z79899 Other long term (current) drug therapy: Secondary | ICD-10-CM | POA: Diagnosis not present

## 2015-06-30 NOTE — Progress Notes (Signed)
Patient had a procedure on her back and has taken 2 rounds of antibiotics since the surgery.  She reports uncontrolled diarrhea since being released from hospital and taking Immodum OTC on a daily basis with minimum relief.

## 2015-07-02 ENCOUNTER — Telehealth: Payer: Self-pay | Admitting: *Deleted

## 2015-07-02 NOTE — Telephone Encounter (Signed)
Sates when she was here she was told she would be put on a new med for edema in her legs and nothing has been called in. Please advise.

## 2015-07-02 NOTE — Progress Notes (Signed)
[place]  Telephone:([phone]) [phone] Fax:(336) [phone]  ID: Amanda Robbins OB: 11/21/1940  MR#: [id number]  [id number]  Patient Care Team: Jackolyn Confer, MD as PCP - General (Internal Medicine)  CHIEF COMPLAINT:  Chief Complaint  Patient presents with  . Ovarian Cancer    INTERVAL HISTORY: Patient returns to clinic today for further evaluation and  Discussion of her PET scan results. since last clinic visit she has had kyphoplasty 2. She is also required multiple rounds of antibiotics. She complains of increased diarrhea today as well as increased peripheral edema.  Her weakness and fatigue are unchanged. 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 She denies any fevers. She denies any chest pain or shortness of breath. She has a fair appetite and denies weight loss. She denies any nausea, vomiting, or constipation. Patient offers no further specific complaints.   REVIEW OF SYSTEMS:   Review of Systems  Constitutional: Positive for malaise/fatigue. Negative for fever and weight loss.  Cardiovascular: Positive for leg swelling.  Gastrointestinal: Positive for diarrhea.  Genitourinary: Negative.   Musculoskeletal: Positive for back pain.  Neurological: Positive for sensory change and weakness.    As per HPI. Otherwise, a complete review of systems is negatve.  PAST MEDICAL HISTORY: Past Medical History  Diagnosis Date  . Ischemic bowel syndrome ([place]) 06/2010  . Irritable bowel   . C. difficile colitis 2011  . Allergic rhinitis     Followed by Dr. Donneta Romberg  . Esophageal reflux     Followed by GI physician, on Dexilant and Ranitidine  . Migraine   . 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 Osteoporosis 2009    on Reclast once yearly, Followed by Dr. Jefm Bryant  . Ovarian cancer ([place])   . COPD (chronic obstructive pulmonary disease) ([place])   . Thrombocytopenia ([place])   . MVP (mitral valve prolapse)   . SVT  (supraventricular tachycardia) ([place])     PAST SURGICAL HISTORY: Past Surgical History  Procedure Laterality Date  . Bil roken wrists    . Gallbladder removed    . Bilateral bunionectomies    . Cesarean section    . Abdominal hysterectomy    . Cholecystectomy    . Kyphoplasty N/A 05/31/2015    Procedure: KYPHOPLASTY L-2 and L-4;  Surgeon: Hessie Knows, MD;  Location: [facility] ORS;  Service: Orthopedics;  Laterality: N/A;    FAMILY HISTORY: Unchanged. No reported history of malignancy or chronic disease.     ADVANCED DIRECTIVES:    HEALTH MAINTENANCE: Social History  Substance Use Topics  . Smoking status: Former Smoker    Quit date: 06/12/1989  . Smokeless tobacco: Never Used  . Alcohol Use: No     Colonoscopy:  PAP:  Bone density:  Lipid panel:  Allergies  Allergen Reactions  . Aspirin     Other reaction(s): Localized superficial swelling of skin uncoated asa-causes throat to swell up  . Codeine Itching    Other reaction(s): Itching of Skin  . Fosamax  [Alendronate Sodium]     Other reaction(s): Distress (finding)  . Ibuprofen   . Ibuprofen     Other reaction(s): Distress (finding)  . Metronidazole Nausea And Vomiting  . Naproxen     Other reaction(s): Distress (finding)  . Naproxen Sodium   . Nsaids     GI UPSET / GERD  . Oxycodone     Other reaction(s): Diarrhea and vomiting (finding)  . Oxycodone-Acetaminophen  Other reaction(s): Diarrhea and vomiting (finding)    Current Outpatient Prescriptions  Medication Sig Dispense Refill  . Acetaminophen 500 MG coapsule Take 1,000 mg by mouth every 6 (six) hours as needed for fever.    . Calcium Carbonate (CALCARB 600 PO) Take 600 mg by mouth 2 (two) times daily.    . cyclobenzaprine (FLEXERIL) 5 MG tablet Take 1 tablet (5 mg total) by mouth 3 (three) times daily as needed for muscle spasms. 20 tablet 0  . dexamethasone (DECADRON) 4 MG tablet Take 1 tablet (4 mg total) by mouth daily. 60 tablet 2  .  DimenhyDRINATE (DRAMAMINE PO) Take 1 tablet by mouth daily as needed (motion sickness.).     Marland Kitchen Diphenhyd-Hydrocort-Nystatin (FIRST-DUKES MOUTHWASH) SUSP Use as directed 5 mLs in the mouth or throat 4 (four) times daily as needed. 1 Bottle 1  . ELIQUIS 5 MG TABS tablet take 1 tablet by mouth twice a day 60 tablet 0  . furosemide (LASIX) 20 MG tablet take 1 tablet by mouth once daily 30 tablet 5  . Multiple Vitamin (MULTIVITAMIN) tablet Take 1 tablet by mouth daily.      . naproxen sodium (ALEVE) 220 MG tablet Take 220 mg by mouth 2 (two) times daily as needed (for pain.).    Marland Kitchen omeprazole (PRILOSEC) 20 MG capsule Take 20 mg by mouth 2 (two) times daily before a meal.    . phenazopyridine (PYRIDIUM) 200 MG tablet Take 1 tablet (200 mg total) by mouth 3 (three) times daily as needed for pain. 10 tablet 0  . potassium chloride SA (K-DUR,KLOR-CON) 20 MEQ tablet take 1 tablet by mouth twice a day 60 tablet 1  . Probiotic Product (PRO-BIOTIC BLEND) CAPS Take 1 capsule by mouth daily.    Marland Kitchen topiramate (TOPAMAX) 25 MG tablet TAKE 1-2 TABLETS BY MOUTH DAILY (Patient taking differently: TAKE 1 TABLETS BY MOUTH DAILY) 60 tablet 5  . traMADol (ULTRAM) 50 MG tablet Take 1 tablet (50 mg total) by mouth every 6 (six) hours as needed. (Patient taking differently: Take 50 mg by mouth every 6 (six) hours as needed for moderate pain or severe pain. ) 60 tablet 1  . zolpidem (AMBIEN) 5 MG tablet as directed take 1/2 to 1 tablet by mouth at bedtime if needed for sleep 30 tablet 1  . doxycycline (VIBRA-TABS) 100 MG tablet Take 1 tablet (100 mg total) by mouth every 12 (twelve) hours. (Patient not taking: Reported on 06/30/2015) 14 tablet 0  . fluconazole (DIFLUCAN) 100 MG tablet Take 1 tablet (100 mg total) by mouth daily. (Patient not taking: Reported on 06/30/2015) 5 tablet 0  . [DISCONTINUED] dexlansoprazole (DEXILANT) 60 MG capsule Take 60 mg by mouth daily.       No current facility-administered medications for this  visit.    OBJECTIVE: Filed Vitals:   06/30/15 1034  BP: 96/65  Pulse: 98  Temp: 98.6 F (37 C)  Resp: 16     Body mass index is 21.23 kg/(m^2).    ECOG FS:2 - Symptomatic, <50% confined to bed  General: Well-developed, well-nourished, no acute distress.  Eyes: anicteric sclera. Lungs: Clear to auscultation bilaterally. Heart: Regular rate and rhythm. No rubs, murmurs, or gallops. Abdomen: Soft, nontender, nondistended. No organomegaly noted, normoactive bowel sounds. Musculoskeletal: 2-3+ bilateral lower extremity edema. Neuro: Alert, answering all questions appropriately. Cranial nerves grossly intact. Skin: No rashes or petechiae noted. Psych: Normal affect.   LAB RESULTS:  Lab Results  Component Value Date   NA 135  06/10/2015   K 4.6 06/10/2015   CL 104 06/10/2015   CO2 23 06/10/2015   GLUCOSE 114* 06/10/2015   BUN 26* 06/10/2015   CREATININE 0.85 06/10/2015   CALCIUM 9.0 06/10/2015   PROT 5.3* 06/10/2015   ALBUMIN 2.9* 06/10/2015   AST 24 06/10/2015   ALT 25 06/10/2015   ALKPHOS 124* 06/10/2015   BILITOT 0.7 06/10/2015   GFRNONAA >60 05/30/2015   GFRAA >60 05/30/2015    Lab Results  Component Value Date   WBC 7.4 06/10/2015   NEUTROABS 6.6 06/10/2015   HGB 11.4* 06/10/2015   HCT 34.8* 06/10/2015   MCV 106.1* 06/10/2015   PLT 169.0 06/10/2015     STUDIES: Nm Pet Image Restag (ps) Skull Base To Thigh  06/28/2015  CLINICAL DATA:  Subsequent treatment strategy for metastatic ovarian carcinoma. Undergoing chemotherapy. Previous radiation therapy. Restaging. EXAM: NUCLEAR MEDICINE PET SKULL BASE TO THIGH TECHNIQUE: 12.0 mCi F-18 FDG was injected intravenously. Full-ring PET imaging was performed from the skull base to thigh after the radiotracer. CT data was obtained and used for attenuation correction and anatomic localization. FASTING BLOOD GLUCOSE:  Value: 76 mg/dl COMPARISON:  CT on 05/29/2015 and PET-CT on 03/03/2015 FINDINGS: NECK No hypermetabolic lymph  nodes in the neck. Previously seen mild hypermetabolic left supraclavicular lymphadenopathy is no longer visualized. CHEST No hypermetabolic mediastinal or hilar nodes. No suspicious pulmonary nodules on the CT scan. Stable biapical scarring and right middle lobe scarring. ABDOMEN/PELVIS No abnormal hypermetabolic activity within the liver, pancreas, adrenal glands, or spleen. There has been interval decrease in hypermetabolic lymphadenopathy in the bilateral retrocrural spaces, abdominal retroperitoneum, bilateral common and external iliac chains, and right inguinal region since previous study. Index abdominal lymph noted left paraaortic region measures 0.9 cm on image 161/series 3 compared to 1.7 cm previously. This has an SUV max of 3.1 compared to 7.2 previously. Index pelvic lymph node in the right external iliac chain measures 11 mm on image 214 of series 3 compared to 14 mm previously, and has current SUV max of 12.4 compared to 5.8 previously. SKELETON New hypermetabolic activity is seen in the anterior third ribs bilaterally corresponding to mild sclerosis, and this is likely due to healing fractures with bone metastases considered less likely. Interval lumbar vertebroplasties are seen which are new since previous study and show hypermetabolic activity. IMPRESSION: Partial metabolic response to therapy, with decreased hypermetabolic lymphadenopathy within the neck, abdomen, and pelvis since previous study. Suspect healing bilateral anterior third rib fractures, with bone metastases considered less likely. Interval lumbar vertebroplasties also noted since prior exam. Electronically Signed   By: Earle Gell M.D.   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  PLAN:    1. Ovarian cancer: PET scan results from  June 28, 2015 reviewed independently and report  As above with improvement of disease burden.   Most recent CA-125 is 51.0.   Patient  will likely require additional chemotherapy in the future , but she does not require additional treatment at this time. Return to clinic in 1 month for further evaluation and treatment planning if needed. Once treatment is reinitiated, she will receive Taxotere 25 mg/m2 on days 1, 8, 15 with day 22 off provided her pancytopenia remains adequate to treat.  2. Peripheral edema: Likely multifactorial.  Recommended patient to increase her Lasix dose to 40 mEq daily and call lymphedema clinic for further evaluation.  3. Weakness and fatigue/decreased performance status: Unchanged,  monitor. 4. Thrombocytopenia:  Improved since discontinuing chemotherapy.  5. Diarrhea:  Possibly worse from antibiotics. Will consider stool culture for C. Difficile if does not resolve. Continue Imodium as needed. 6. Back pain: Kyphoplasty 2 as above. Treatment per orthopedics.  Approximately 30 minutes was spent in discussion of which greater than 50% was consultation.   Patient expressed understanding and was in agreement with this plan. She also understands that She can call clinic at any time with any questions, concerns, or complaints.    Lloyd Huger, MD   07/02/2015 3:43 PM

## 2015-07-02 NOTE — Telephone Encounter (Signed)
Returned call to pt and advised of Dr Gary Fleet response. She states she misunderstood him and thanked me for "clearing that up"

## 2015-07-02 NOTE — Telephone Encounter (Signed)
Patient was told to double her lasix dose, no new meds were discussed.

## 2015-07-03 ENCOUNTER — Emergency Department: Payer: Medicare Other

## 2015-07-03 ENCOUNTER — Encounter: Payer: Self-pay | Admitting: Emergency Medicine

## 2015-07-03 ENCOUNTER — Emergency Department
Admission: EM | Admit: 2015-07-03 | Discharge: 2015-07-03 | Disposition: A | Discharge status: Home / Self Care | Payer: Medicare Other | Attending: Student | Admitting: Student

## 2015-07-03 ENCOUNTER — Other Ambulatory Visit: Payer: Self-pay

## 2015-07-03 DIAGNOSIS — W19XXXA Unspecified fall, initial encounter: Secondary | ICD-10-CM

## 2015-07-03 DIAGNOSIS — W1839XA Other fall on same level, initial encounter: Secondary | ICD-10-CM | POA: Diagnosis not present

## 2015-07-03 DIAGNOSIS — Y9389 Activity, other specified: Secondary | ICD-10-CM | POA: Diagnosis not present

## 2015-07-03 DIAGNOSIS — Y92000 Kitchen of unspecified non-institutional (private) residence as  the place of occurrence of the external cause: Secondary | ICD-10-CM | POA: Insufficient documentation

## 2015-07-03 DIAGNOSIS — Z79899 Other long term (current) drug therapy: Secondary | ICD-10-CM | POA: Insufficient documentation

## 2015-07-03 DIAGNOSIS — Z87891 Personal history of nicotine dependence: Secondary | ICD-10-CM | POA: Insufficient documentation

## 2015-07-03 DIAGNOSIS — R2243 Localized swelling, mass and lump, lower limb, bilateral: Secondary | ICD-10-CM | POA: Insufficient documentation

## 2015-07-03 DIAGNOSIS — R079 Chest pain, unspecified: Secondary | ICD-10-CM

## 2015-07-03 DIAGNOSIS — Y998 Other external cause status: Secondary | ICD-10-CM | POA: Diagnosis not present

## 2015-07-03 DIAGNOSIS — S2232XA Fracture of one rib, left side, initial encounter for closed fracture: Secondary | ICD-10-CM | POA: Insufficient documentation

## 2015-07-03 DIAGNOSIS — S29001A Unspecified injury of muscle and tendon of front wall of thorax, initial encounter: Secondary | ICD-10-CM | POA: Diagnosis present

## 2015-07-03 LAB — URINALYSIS COMPLETE WITH MICROSCOPIC (ARMC ONLY)
Bilirubin Urine: NEGATIVE
GLUCOSE, UA: NEGATIVE mg/dL
KETONES UR: NEGATIVE mg/dL
Nitrite: NEGATIVE
PROTEIN: NEGATIVE mg/dL
SPECIFIC GRAVITY, URINE: 1.005 (ref 1.005–1.030)
Squamous Epithelial / LPF: NONE SEEN
pH: 5 (ref 5.0–8.0)

## 2015-07-03 LAB — CBC WITH DIFFERENTIAL/PLATELET
Basophils Absolute: 0 10*3/uL (ref 0–0.1)
Basophils Relative: 0 %
EOS ABS: 0 10*3/uL (ref 0–0.7)
Eosinophils Relative: 0 %
HCT: 43.8 % (ref 35.0–47.0)
HEMOGLOBIN: 14.4 g/dL (ref 12.0–16.0)
LYMPHS ABS: 0.8 10*3/uL — AB (ref 1.0–3.6)
LYMPHS PCT: 11 %
MCH: 34.9 pg — AB (ref 26.0–34.0)
MCHC: 32.9 g/dL (ref 32.0–36.0)
MCV: 106.2 fL — AB (ref 80.0–100.0)
Monocytes Absolute: 0.3 10*3/uL (ref 0.2–0.9)
Monocytes Relative: 4 %
NEUTROS ABS: 6.4 10*3/uL (ref 1.4–6.5)
NEUTROS PCT: 85 %
Platelets: 117 10*3/uL — ABNORMAL LOW (ref 150–440)
RBC: 4.13 MIL/uL (ref 3.80–5.20)
RDW: 17 % — ABNORMAL HIGH (ref 11.5–14.5)
WBC: 7.6 10*3/uL (ref 3.6–11.0)

## 2015-07-03 LAB — COMPREHENSIVE METABOLIC PANEL
ALBUMIN: 2.9 g/dL — AB (ref 3.5–5.0)
ALT: 23 U/L (ref 14–54)
AST: 23 U/L (ref 15–41)
Alkaline Phosphatase: 88 U/L (ref 38–126)
Anion gap: 6 (ref 5–15)
BILIRUBIN TOTAL: 1 mg/dL (ref 0.3–1.2)
BUN: 19 mg/dL (ref 6–20)
CHLORIDE: 108 mmol/L (ref 101–111)
CO2: 27 mmol/L (ref 22–32)
CREATININE: 0.75 mg/dL (ref 0.44–1.00)
Calcium: 8.5 mg/dL — ABNORMAL LOW (ref 8.9–10.3)
GFR calc Af Amer: >60 mL/min (ref >60)
GFR calc non Af Amer: >60 mL/min (ref >60)
GLUCOSE: 117 mg/dL — AB (ref 65–99)
POTASSIUM: 3.6 mmol/L (ref 3.5–5.1)
Sodium: 141 mmol/L (ref 135–145)
TOTAL PROTEIN: 5.5 g/dL — AB (ref 6.5–8.1)

## 2015-07-03 LAB — TROPONIN I: Troponin I: <0.03 ng/mL (ref <0.031)

## 2015-07-03 MED ORDER — NITROFURANTOIN MONOHYD MACRO 100 MG PO CAPS: 100.0000 mg | ORAL_CAPSULE | Freq: Two times a day (BID) | ORAL | Status: DC

## 2015-07-03 MED ORDER — ACETAMINOPHEN 500 MG PO TABS: 500.0000 mg | ORAL_TABLET | Freq: Four times a day (QID) | ORAL | Status: AC | PRN

## 2015-07-03 MED ORDER — MORPHINE SULFATE (PF) 4 MG/ML IV SOLN
4.0000 mg | Freq: Once | INTRAVENOUS | Status: AC
  Administered 2015-07-03: 4 mg via INTRAVENOUS
  Filled 2015-07-03: qty 1

## 2015-07-03 MED ORDER — IOHEXOL 350 MG/ML SOLN
75.0000 mL | Freq: Once | INTRAVENOUS | Status: AC | PRN
  Administered 2015-07-03: 75 mL via INTRAVENOUS

## 2015-07-03 MED ORDER — ONDANSETRON HCL 4 MG/2ML IJ SOLN
4.0000 mg | Freq: Once | INTRAMUSCULAR | Status: AC
  Administered 2015-07-03: 4 mg via INTRAVENOUS
  Filled 2015-07-03: qty 2

## 2015-07-03 MED ORDER — TRAMADOL HCL 50 MG PO TABS: 50.0000 mg | ORAL_TABLET | Freq: Four times a day (QID) | ORAL | Status: DC | PRN

## 2015-07-03 MED ORDER — SODIUM CHLORIDE 0.9 % IV BOLUS (SEPSIS)
500.0000 mL | Freq: Once | INTRAVENOUS | Status: AC
  Administered 2015-07-03: 500 mL via INTRAVENOUS

## 2015-07-03 MED ORDER — NITROFURANTOIN MONOHYD MACRO 100 MG PO CAPS
100.0000 mg | ORAL_CAPSULE | Freq: Once | ORAL | Status: AC
  Administered 2015-07-03: 100 mg via ORAL
  Filled 2015-07-03: qty 1

## 2015-07-03 NOTE — ED Notes (Signed)
Report given to Sonjia, RN 

## 2015-07-03 NOTE — ED Provider Notes (Addendum)
[facility] Emergency Department Provider Note  ____________________________________________  Time seen: Approximately 11:14 AM  I have reviewed the triage vital signs and the nursing notes.   HISTORY  Chief Complaint Fall    HPI Amanda Robbins is a 75 y.o. female with ovarian cancer now on a break from chemotherapy last chemotherapy for 5 weeks ago, s/p radiation for brain metastasis, COPD, thrombocytopenia who presents for evaluation of left chest wall pain for 2 days, gradual onset, constant since onset, worse with movement, currently moderate to severe. The patient reports that at 3 AM on 07/02/2015 she awoke and found herself on the floor of the kitchen with her walker nearby; she apparently had walked into the kitchen though she does not recall this. She called her husband who had noted that she had fallen in the kitchen and that she was "weak and acting loopy". The patient did not recall how she had managed to walk to the kitchen. She does not have a history of sleepwalking but reports that that night she did take a pain pill as well as an Ambien before going to bed. She went back to sleep immediately and woke up later yesterday morning with some left-sided chest pain which has been increasing in severity. Husband reports that she has been alert, oriented, mentating appropriately and has not seemed altered since he found her on the floor in the kitchen. She has had no cough, no fevers, no vomiting or diarrhea. She is not sure whether not she hit her head when she fell. She has no other pain complaints at this time. She did have kyphoplasty for compression fractures in the lumbar spine last month reports that she has been healing well and denies any back pain at this time.   Past Medical History  Diagnosis Date  . 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 Irritable bowel   . C. difficile colitis 2011  . Allergic rhinitis     Followed by Dr. Donneta Romberg  . Esophageal  reflux     Followed by GI physician, on Dexilant and Ranitidine  . Migraine   . 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 Osteoporosis 2009    on Reclast once yearly, Followed by Dr. Jefm Bryant  . Ovarian cancer ([place])   . COPD (chronic obstructive pulmonary disease) ([place])   . Thrombocytopenia ([place])   . MVP (mitral valve prolapse)   . SVT (supraventricular tachycardia) [facility])     Patient Active Problem List   Diagnosis Date Noted  . Malnutrition of moderate degree 05/31/2015  . Lumbar compression fracture ([place])   . Sepsis ([place]) 05/29/2015  . Cellulitis of right lower extremity 05/29/2015  . Back pain 05/29/2015  . Macrocytic anemia 12/01/2014  . Ovarian cancer ([place]) 08/31/2014  . 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 Brain metastasis ([place]) 06/01/2014  . Dizzinesses 05/26/2013  . Medicare annual wellness visit, subsequent 11/22/2011  . GERD (gastroesophageal reflux disease) 08/22/2011  . Neuropathy ([place]) 05/24/2011  . Insomnia 05/24/2011  . Osteoporosis 05/24/2011  . IBS (irritable bowel syndrome) 05/24/2011  . Migraine 05/24/2011  . Thrombocytopenia ([place]) 05/24/2011  . Hyperlipidemia 05/24/2011    Past Surgical History  Procedure Laterality Date  . Bil roken wrists    . Gallbladder removed    . Bilateral bunionectomies    . Cesarean section    . Abdominal hysterectomy    . Cholecystectomy    . Kyphoplasty N/A 05/31/2015  Procedure: KYPHOPLASTY L-2 and L-4;  Surgeon: Hessie Knows, MD;  Location: [facility] ORS;  Service: Orthopedics;  Laterality: N/A;    Current Outpatient Rx  Name  Route  Sig  Dispense  Refill  . Acetaminophen 500 MG coapsule   Oral   Take 1,000 mg by mouth every 6 (six) hours as needed for fever.         . Calcium Carbonate (CALCARB 600 PO)   Oral   Take 600 mg by mouth 2 (two) times daily.         . cyclobenzaprine (FLEXERIL) 5 MG tablet   Oral   Take 1 tablet (5 mg total) by mouth 3 (three)  times daily as needed for muscle spasms.   20 tablet   0   . dexamethasone (DECADRON) 4 MG tablet   Oral   Take 1 tablet (4 mg total) by mouth daily.   60 tablet   2   . DimenhyDRINATE (DRAMAMINE PO)   Oral   Take 1 tablet by mouth daily as needed (motion sickness.).          Marland Kitchen Diphenhyd-Hydrocort-Nystatin (FIRST-DUKES MOUTHWASH) SUSP   Mouth/Throat   Use as directed 5 mLs in the mouth or throat 4 (four) times daily as needed.   1 Bottle   1   . doxycycline (VIBRA-TABS) 100 MG tablet   Oral   Take 1 tablet (100 mg total) by mouth every 12 (twelve) hours. Patient not taking: Reported on 06/30/2015   14 tablet   0   . ELIQUIS 5 MG TABS tablet      take 1 tablet by mouth twice a day   60 tablet   0     Dispense as written.   . fluconazole (DIFLUCAN) 100 MG tablet   Oral   Take 1 tablet (100 mg total) by mouth daily. Patient not taking: Reported on 06/30/2015   5 tablet   0   . furosemide (LASIX) 20 MG tablet      take 1 tablet by mouth once daily   30 tablet   5   . Multiple Vitamin (MULTIVITAMIN) tablet   Oral   Take 1 tablet by mouth daily.           . naproxen sodium (ALEVE) 220 MG tablet   Oral   Take 220 mg by mouth 2 (two) times daily as needed (for pain.).         Marland Kitchen omeprazole (PRILOSEC) 20 MG capsule   Oral   Take 20 mg by mouth 2 (two) times daily before a meal.         . phenazopyridine (PYRIDIUM) 200 MG tablet   Oral   Take 1 tablet (200 mg total) by mouth 3 (three) times daily as needed for pain.   10 tablet   0   . potassium chloride SA (K-DUR,KLOR-CON) 20 MEQ tablet      take 1 tablet by mouth twice a day   60 tablet   1   . Probiotic Product (PRO-BIOTIC BLEND) CAPS   Oral   Take 1 capsule by mouth daily.         Marland Kitchen topiramate (TOPAMAX) 25 MG tablet      TAKE 1-2 TABLETS BY MOUTH DAILY Patient taking differently: TAKE 1 TABLETS BY MOUTH DAILY   60 tablet   5   . traMADol (ULTRAM) 50 MG tablet   Oral   Take 1 tablet  (50 mg total) by mouth every 6 (six)  hours as needed. Patient taking differently: Take 50 mg by mouth every 6 (six) hours as needed for moderate pain or severe pain.    60 tablet   1   . zolpidem (AMBIEN) 5 MG tablet      as directed take 1/2 to 1 tablet by mouth at bedtime if needed for sleep   30 tablet   1     Allergies Aspirin; Codeine; Fosamax ; Ibuprofen; Ibuprofen; Metronidazole; Naproxen; Naproxen sodium; Nsaids; Oxycodone; and Oxycodone-acetaminophen  Family History  Problem Relation Age of Onset  . Ovarian cancer Maternal Grandmother 36  . Breast cancer Maternal Aunt 70    Social History Social History  Substance Use Topics  . Smoking status: Former Smoker    Quit date: 06/12/1989  . Smokeless tobacco: Never Used  . Alcohol Use: No    Review of Systems Constitutional: No fever/chills Eyes: No visual changes. ENT: No sore throat. Cardiovascular: +chest pain. Respiratory: Denies shortness of breath. Gastrointestinal: No abdominal pain.  No nausea, no vomiting.  No diarrhea.  No constipation. Genitourinary: Negative for dysuria. Musculoskeletal: Negative for back pain. Skin: Negative for rash. Neurological: Negative for headaches, focal weakness or numbness.  10-point ROS otherwise negative.  ____________________________________________   PHYSICAL EXAM:  VITAL SIGNS: ED Triage Vitals  Enc Vitals Group     BP 07/03/15 1018 93/58 mmHg     Pulse Rate 07/03/15 1018 84     Resp 07/03/15 1018 18     Temp 07/03/15 1018 97.9 F (36.6 C)     Temp Source 07/03/15 1018 Oral     SpO2 07/03/15 1018 98 %     Weight 07/03/15 1018 130 lb (58.968 kg)     Height 07/03/15 1018 5' 5.5" (1.664 m)     Head Cir --      Peak Flow --      Pain Score --      Pain Loc --      Pain Edu? --      Excl. in [place]? --     Constitutional: Alert and oriented. In mild distress due to pain in the left chest. Eyes: Conjunctivae are normal. PERRL. EOMI. Head: Atraumatic. Nose:  No congestion/rhinnorhea. Mouth/Throat: Mucous membranes are moist.  Oropharynx non-erythematous. Neck: No stridor.  No midline C-spine tenderness to palpation. Cardiovascular: Normal rate, regular rhythm. Grossly normal heart sounds.  Good peripheral circulation. Respiratory: Normal respiratory effort.  No retractions. Lungs CTAB. Gastrointestinal: Soft and nontender. No distention. No CVA tenderness. Genitourinary: deferred Musculoskeletal: 2+ pitting edema bilateral lower extremities. No midline tenderness to palpation throat the T or L-spine. Pelvis stable to rock and compression. Mild to moderate tenderness to palpation throughout the left anterior and lateral chest wall. Neurologic:  Normal speech and language. No gross focal neurologic deficits are appreciated. No gait instability. Skin:  Skin is warm, dry and intact. No rash noted. Psychiatric: Mood and affect are normal. Speech and behavior are normal.  ____________________________________________   LABS (all labs ordered are listed, but only abnormal results are displayed)  Labs Reviewed  CBC WITH DIFFERENTIAL/PLATELET - Abnormal; Notable for the following:    MCV 106.2 (*)    MCH 34.9 (*)    RDW 17.0 (*)    Platelets 117 (*)    Lymphs Abs 0.8 (*)    All other components within normal limits  URINALYSIS COMPLETEWITH MICROSCOPIC ([facility] ONLY) - Abnormal; Notable for the following:    Color, Urine STRAW (*)    APPearance CLOUDY (*)  Hgb urine dipstick 1+ (*)    Leukocytes, UA 3+ (*)    Bacteria, UA RARE (*)    All other components within normal limits  COMPREHENSIVE METABOLIC PANEL - Abnormal; Notable for the following:    Glucose, Bld 117 (*)    Calcium 8.5 (*)    Total Protein 5.5 (*)    Albumin 2.9 (*)    All other components within normal limits  TROPONIN I   ____________________________________________  EKG  ED ECG REPORT I, Joanne Gavel, the attending physician, personally viewed and interpreted this  ECG.   Date: 07/03/2015  EKG Time: 10:45  Rate: 67  Rhythm: normal sinus rhythm  Axis: normal  Intervals:none  ST&T Change: Less than 0.5 mm ST elevation in lead 2, 3 and aVF is chronic. No acute ST segment change. EKG is unchanged when compared to 07/21/2014.  ____________________________________________  RADIOLOGY  CXR  IMPRESSION: No evidence of displaced rib fractures.  Small left pleural effusion.   CT head  FINDINGS: Brain: No evidence of acute infarction, hemorrhage, extra-axial collection, ventriculomegaly, or mass effect. There is extensive diffuse hypoattenuated appearance of the subcortical and periventricular white matter, which may be secondary to prior whole brain radiation therapy. Small calcified masses in the right subcortical frontal lobe and right temporal lobe, with surrounding vasogenic edema, likely represent residual treated metastatic lesions.  Vascular: No hyperdense vessel or unexpected calcification.  Skull: Negative for fracture or focal lesion.  Sinuses/Orbits: No acute findings.  Other: None.  IMPRESSION: Extensive diffuse decreased attenuation of the periventricular and subcortical white matter, likely due to prior whole-brain radiation.  Residual sub cm calcified metastatic lesions within the subcortical right frontal and right temporal lobes with surrounding vasogenic edema.  No evidence of acute traumatic injury to the head.  CTA chest IMPRESSION: No pulmonary emboli  Subacute left third rib fracture new from 05/29/2015  New from 05/29/2015 is status post kyphoplasty of thoracolumbar compression deformity as described above. ____________________________________________   PROCEDURES  Procedure(s) performed: None  Critical Care performed: No  ____________________________________________   INITIAL IMPRESSION / ASSESSMENT AND PLAN / ED COURSE  Pertinent labs & imaging results that were available during my care of  the patient were reviewed by me and considered in my medical decision making (see chart for details).  Amanda Robbins is a 75 y.o. female with ovarian cancer now on a break from chemotherapy, last chemotherapy for 5 weeks ago, COPD, thrombocytopenia who presents for evaluation of left chest wall pain for 2 days, gradual onset, constant since onset, worse with movement which began after she fell. On exam, she is in mild distress due to pain. Vital signs are stable, she is afebrile. She does have tenderness to palpation throughout the left anterolateral chest wall. We'll obtain chest x-ray to evaluate for rib fracture. We'll obtain screening labs, urinalysis as well as CT scan of her head given her transient altered mental status (though suspect this may have been medication related), possible head injury with fall, though she cannot be quite sure. We'll treat her pain. Reassess for disposition.  ----------------------------------------- 2:26 PM on 07/03/2015 ----------------------------------------- CT head shows no acute intracranial process, sequela of prior known metastatic disease is noted. Chest x-ray shows no evidence of displaced rib fractures however there is a small left pleural effusion. CBC is generally unremarkable. Chemistry pending. Urinalysis is concerning for possible urinary tract infection. The patient continues to complain of severe pain in the chest and her pain is only somewhat reproducible on  my examination. Though she may have a rib contusion or an occult rib fracture, she had a recent operation, she has a small left pleural effusion and known cancer increasing risk for PE therefore we'll obtain CT a chest to rule out PE which could certainly cause her left-sided chest pain.  ----------------------------------------- 4:09 PM on 07/03/2015 -----------------------------------------  CTA chest is negative for PE. There is a left third rib fracture which is the mostly cause of the  patient's chest pain. Labs reviewed. Troponin negative, not consistent with ACS. Normal CMP. Normal CBC. Urinalysis is concerning for urinary tract infection. Normal creatinine and cultures previously been sensitive to Macrobid therefore we'll discharge with Macrobid. We discussed meticulous return precautions, and use of incentive spirometer, pain control, need for close PCP follow-up in the patient and her husband at bedside are comfortable with the discharge plan. DC home.  ____________________________________________   FINAL CLINICAL IMPRESSION(S) / ED DIAGNOSES  Final diagnoses:  Fall, initial encounter  Chest pain, unspecified chest pain type  Closed rib fracture, left, initial encounter      Joanne Gavel, MD 07/03/15 [place], MD 07/03/15 1621

## 2015-07-03 NOTE — ED Notes (Signed)
Yesterday at 3 am pt woke up and was in floor.  Unsure how she got there, does not remember falling or rolling out of bed.  C/o pain under left breast.  Pain worse with breathing and moving.

## 2015-07-03 NOTE — ED Notes (Signed)
Appears to have irregular HR with pulse ox waveform.

## 2015-07-07 ENCOUNTER — Telehealth: Payer: Self-pay | Admitting: *Deleted

## 2015-07-07 MED ORDER — AMOXICILLIN-POT CLAVULANATE 875-125 MG PO TABS: 1.0000 | ORAL_TABLET | Freq: Two times a day (BID) | ORAL | Status: DC

## 2015-07-07 NOTE — Telephone Encounter (Signed)
Per Dr Grayland Ormond if she has come off her Eliquis (she had asked if she could stop it for a procedure), then she needs to have a doppler, if she is still on it, then change her abx from Nitrofurantoin to Augmentin 875/125 bid times 10 days. I called and spoke with patient and she states she is still on her Eliquis and is to stop it on the 10 th. I informed her that she is to stop the Nitrofurantoin and asked which pharmacy to call in Augmentin and she stated [facility] on [address]. Med e scribed

## 2015-07-07 NOTE — Telephone Encounter (Signed)
Awoke with red hot right leg this morning. Fell Sat and fx rib on left side and is on Nitrofurantin for UTI. Has had cellulitis in same leg in mid December. Asking what needs to be done

## 2015-07-07 NOTE — Telephone Encounter (Signed)
Sam called back to report that Amanda Robbins is feeling poorly and her b/p is 74/55. I advised him to take her to ER for evaluation or if he feels she can wait to force fluids and see how she does after a few hours. He said he would try fluids adn if no help he will take her to Er

## 2015-07-07 NOTE — Telephone Encounter (Signed)
I called Amanda Robbins to check on how Amanda Robbins is doing adn he said she is forcing fluids and feeling a little better, her b/p is up to 95/63. He expressed appreciation to the cancer center for the care she gets from [place].

## 2015-07-08 ENCOUNTER — Encounter: Payer: Self-pay | Admitting: Internal Medicine

## 2015-07-08 ENCOUNTER — Ambulatory Visit (INDEPENDENT_AMBULATORY_CARE_PROVIDER_SITE_OTHER): Payer: Medicare Other | Admitting: Internal Medicine

## 2015-07-08 VITALS — BP 95/62 | HR 80 | Temp 97.7°F | Ht 66.0 in | Wt 133.2 lb

## 2015-07-08 DIAGNOSIS — S32000S Wedge compression fracture of unspecified lumbar vertebra, sequela: Secondary | ICD-10-CM | POA: Diagnosis not present

## 2015-07-08 DIAGNOSIS — M8580 Other specified disorders of bone density and structure, unspecified site: Secondary | ICD-10-CM | POA: Diagnosis not present

## 2015-07-08 DIAGNOSIS — C569 Malignant neoplasm of unspecified ovary: Secondary | ICD-10-CM | POA: Diagnosis not present

## 2015-07-08 DIAGNOSIS — L03115 Cellulitis of right lower limb: Secondary | ICD-10-CM | POA: Diagnosis not present

## 2015-07-08 DIAGNOSIS — R0789 Other chest pain: Secondary | ICD-10-CM | POA: Diagnosis not present

## 2015-07-08 MED ORDER — DENOSUMAB 60 MG/ML ~~LOC~~ SOLN
60.0000 mg | Freq: Once | SUBCUTANEOUS | Status: AC
  Administered 2015-07-08: 60 mg via SUBCUTANEOUS

## 2015-07-08 MED ORDER — LIDOCAINE 5 % EX PTCH: 1.0000 | MEDICATED_PATCH | CUTANEOUS | Status: DC

## 2015-07-08 MED ORDER — TRAMADOL HCL 50 MG PO TABS: 50.0000 mg | ORAL_TABLET | Freq: Three times a day (TID) | ORAL | Status: DC | PRN

## 2015-07-08 NOTE — Progress Notes (Signed)
Subjective:    Patient ID: Amanda Robbins, female    DOB: 07-08-1940, 75 y.o.   MRN: [id number]  HPI  75YO female presents for follow up.  Currently being treated for UTI.  [place] recently. Seen in ED. CT head showed no acute process. CT chest was negative for PE, however showed left rib fracture. Given tramadol for pain with some improvement. Taking Tramadol tid.  Yesterday BP was very low 55/42.  Instructed to give increased fluids. BP improved to 90s/60s, however then developed redness right leg. Started on Augmentin by Dr. Grayland Ormond. No fever, chills.  Continues to have back pain at site of kyphoplasty. Referred to pain management from ortho. Scheduled to have ESI on 2/14.   Wt Readings from Last 3 Encounters:  07/08/15 133 lb 4 oz (60.442 kg)  07/03/15 130 lb (58.968 kg)  06/30/15 130 lb 8.2 oz (59.2 kg)   BP Readings from Last 3 Encounters:  07/08/15 95/62  07/03/15 129/75  06/30/15 96/65    Past Medical History  Diagnosis Date  . 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 Irritable bowel   . C. difficile colitis 2011  . Allergic rhinitis     Followed by Dr. Donneta Romberg  . Esophageal reflux     Followed by GI physician, on Dexilant and Ranitidine  . Migraine   . 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 Osteoporosis 2009    on Reclast once yearly, Followed by Dr. Jefm Bryant  . Ovarian cancer ([place])   . COPD (chronic obstructive pulmonary disease) ([place])   . Thrombocytopenia ([place])   . MVP (mitral valve prolapse)   . SVT (supraventricular tachycardia) ([facility])    Family History  Problem Relation Age of Onset  . Ovarian cancer Maternal Grandmother 58  . Breast cancer Maternal Aunt 70   Past Surgical History  Procedure Laterality Date  . Bil roken wrists    . Gallbladder removed    . Bilateral bunionectomies    . Cesarean section    . Abdominal hysterectomy    . Cholecystectomy    . Kyphoplasty N/A 05/31/2015    Procedure: KYPHOPLASTY L-2  and L-4;  Surgeon: Hessie Knows, MD;  Location: [facility] ORS;  Service: Orthopedics;  Laterality: N/A;   Social History   Social History  . Marital Status: Married    Spouse Name: N/A  . Number of Children: N/A  . Years of Education: N/A   Social History Main Topics  . Smoking status: Former Smoker    Quit date: 06/12/1989  . Smokeless tobacco: Never Used  . Alcohol Use: No  . Drug Use: No  . Sexual Activity: Not Asked   Other Topics Concern  . None   Social History Narrative    Review of Systems  Constitutional: Positive for fatigue. Negative for fever, chills, appetite change and unexpected weight change.  Eyes: Negative for visual disturbance.  Respiratory: Negative for shortness of breath.   Cardiovascular: Positive for chest pain and leg swelling.  Gastrointestinal: Negative for nausea, vomiting, abdominal pain, diarrhea and constipation.  Musculoskeletal: Positive for myalgias and arthralgias.  Skin: Negative for color change and rash.  Neurological: Positive for weakness.  Hematological: Negative for adenopathy. Does not bruise/bleed easily.  Psychiatric/Behavioral: Positive for sleep disturbance. Negative for dysphoric mood. The patient is not nervous/anxious.        Objective:    BP 95/62 mmHg  Pulse 80  Temp(Src) 97.7 F (36.5 C) (Oral)  Ht  5\' 6"  (1.676 m)  Wt 133 lb 4 oz (60.442 kg)  BMI 21.52 kg/m2  SpO2 95% Physical Exam  Constitutional: She is oriented to person, place, and time. She appears well-developed and well-nourished. No distress.  Pale, chronically ill appearing  HENT:  Head: Normocephalic and atraumatic.  Right Ear: External ear normal.  Left Ear: External ear normal.  Nose: Nose normal.  Mouth/Throat: Oropharynx is clear and moist. No oropharyngeal exudate.  Eyes: Conjunctivae are normal. Pupils are equal, round, and reactive to light. Right eye exhibits no discharge. Left eye exhibits no discharge. No scleral icterus.  Neck: Normal range  of motion. Neck supple. No tracheal deviation present. No thyromegaly present.  Cardiovascular: Normal rate, regular rhythm, normal heart sounds and intact distal pulses.  Exam reveals no gallop and no friction rub.   No murmur heard. Pulmonary/Chest: Effort normal and breath sounds normal. No respiratory distress. She has no decreased breath sounds. She has no wheezes. She has no rhonchi. She has no rales. She exhibits tenderness and bony tenderness.    Musculoskeletal: Normal range of motion. She exhibits edema (pitting edema bilateral LE to thighs). She exhibits no tenderness.  Lymphadenopathy:    She has no cervical adenopathy.  Neurological: She is alert and oriented to person, place, and time. No cranial nerve deficit. She exhibits normal muscle tone. Coordination normal.  Skin: Skin is warm and dry. No rash noted. She is not diaphoretic. There is erythema (bilateral lower legs right>left). No pallor.  Psychiatric: She has a normal mood and affect. Her behavior is normal. Judgment and thought content normal.          Assessment & Plan:   Problem List Items Addressed This Visit      Unprioritized   Cellulitis of right lower extremity    Recurrent cellulitis right LE. On Augmentin, started by Dr. Grayland Ormond. Symptoms improving. Will continue to monitor.      Chest wall pain    Left lower chest wall pain secondary to rib fracture from fall. Pain controlled with prn Tramadol. Will also add topical lidoderm. Follow up with oncology and pain management in 2 weeks and here in 4 weeks and prn. Continue incentive spirometer.      Lumbar compression fracture ([facility])    Some persistent low back pain from compression fracture. Evaluation with Dr. Sharlet Salina pending for Feb 2017 for possible ESI.      Ovarian cancer ([place]) - Primary    Continues to follow with oncology. Progressive function al decline. Home health in place. Husband caring for patient. They deny any needs at this time.       Relevant Medications   traMADol (ULTRAM) 50 MG tablet       Return in about 4 weeks (around 08/05/2015) for Recheck.

## 2015-07-08 NOTE — Progress Notes (Signed)
Pre visit review using our clinic review tool, if applicable. No additional management support is needed unless otherwise documented below in the visit note. 

## 2015-07-08 NOTE — Assessment & Plan Note (Signed)
Left lower chest wall pain secondary to rib fracture from fall. Pain controlled with prn Tramadol. Will also add topical lidoderm. Follow up with oncology and pain management in 2 weeks and here in 4 weeks and prn. Continue incentive spirometer.

## 2015-07-08 NOTE — Assessment & Plan Note (Signed)
Recurrent cellulitis right LE. On Augmentin, started by Dr. Grayland Ormond. Symptoms improving. Will continue to monitor.

## 2015-07-08 NOTE — Assessment & Plan Note (Signed)
Continues to follow with oncology. Progressive function al decline. Home health in place. Husband caring for patient. They deny any needs at this time.

## 2015-07-08 NOTE — Assessment & Plan Note (Signed)
Some persistent low back pain from compression fracture. Evaluation with Dr. Sharlet Salina pending for Feb 2017 for possible ESI.

## 2015-07-08 NOTE — Addendum Note (Signed)
Addended by: Vernetta Honey on: 07/08/2015 03:57 PM   Modules accepted: Orders

## 2015-07-08 NOTE — Patient Instructions (Signed)
Continue Tramadol as needed.  Start Lidocaine patches to help with pain.  Follow up with Dr. Sharlet Salina and Dr. Grayland Ormond as scheduled.

## 2015-07-11 ENCOUNTER — Other Ambulatory Visit: Payer: Self-pay | Admitting: Internal Medicine

## 2015-07-13 ENCOUNTER — Telehealth: Payer: Self-pay

## 2015-07-13 NOTE — Telephone Encounter (Signed)
PA for Lidoderm patches started on Cover my meds.

## 2015-07-16 NOTE — Telephone Encounter (Signed)
Received on 06/2615. Thanks

## 2015-07-17 ENCOUNTER — Other Ambulatory Visit: Payer: Self-pay | Admitting: Oncology

## 2015-07-20 NOTE — Telephone Encounter (Signed)
Patches approved. Until December 31st, 2017.

## 2015-07-29 ENCOUNTER — Ambulatory Visit: Payer: Medicare Other | Attending: Oncology | Admitting: Occupational Therapy

## 2015-07-29 DIAGNOSIS — I890 Lymphedema, not elsewhere classified: Secondary | ICD-10-CM | POA: Diagnosis not present

## 2015-07-30 ENCOUNTER — Ambulatory Visit: Payer: Medicare Other | Admitting: Occupational Therapy

## 2015-07-30 ENCOUNTER — Encounter: Payer: Self-pay | Admitting: Occupational Therapy

## 2015-07-30 DIAGNOSIS — I890 Lymphedema, not elsewhere classified: Secondary | ICD-10-CM | POA: Diagnosis not present

## 2015-07-30 NOTE — Patient Instructions (Signed)
LE instructions and precautions as established- see initial eval.   

## 2015-07-30 NOTE — Therapy (Signed)
[place] MAIN [facility] SERVICES [address] [place], [place], [address] Phone: [phone]   Fax:  [phone]  Occupational Therapy Evaluation  Patient Details  Name: Amanda Robbins MRN: [id number] Date of Birth: 1941/01/27 No Data Recorded  Encounter Date: 07/29/2015      OT End of Session - 07/30/15 1203    Visit Number 1   Number of Visits 36   Date for OT Re-Evaluation 10/28/15   OT Start Time 1037   OT Stop Time 1158   OT Time Calculation (min) 81 min   Equipment Utilized During Treatment LE Self Care Workbook   Activity Tolerance Patient tolerated treatment well;No increased pain;Patient limited by fatigue;Patient limited by pain   Behavior During Therapy [facility] for tasks assessed/performed      Past Medical History  Diagnosis Date  . 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 Irritable bowel   . C. difficile colitis 2011  . Allergic rhinitis     Followed by Dr. Donneta Romberg  . Esophageal reflux     Followed by GI physician, on Dexilant and Ranitidine  . Migraine   . 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 Osteoporosis 2009    on Reclast once yearly, Followed by Dr. Jefm Bryant  . Ovarian cancer ([place])   . COPD (chronic obstructive pulmonary disease) ([place])   . Thrombocytopenia ([place])   . MVP (mitral valve prolapse)   . SVT (supraventricular tachycardia) [facility])     Past Surgical History  Procedure Laterality Date  . Bil roken wrists    . Gallbladder removed    . Bilateral bunionectomies    . Cesarean section    . Abdominal hysterectomy    . Cholecystectomy    . Kyphoplasty N/A 05/31/2015    Procedure: KYPHOPLASTY L-2 and L-4;  Surgeon: Hessie Knows, MD;  Location: [facility] ORS;  Service: Orthopedics;  Laterality: N/A;    There were no vitals filed for this visit.  Visit Diagnosis:  Lymphedema of both lower extremities - Plan: Ot plan of care cert/re-cert      Subjective Assessment - 07/30/15 0931     Subjective  Pt presents today for evaluation and treatment of BLE lymphedema. She is referred by Amanda Huger, MD. Pt presents as a frail, thin, chronically ill female with moderate swelling in B lower quadrants, L>R. Pt is accompanied today by her husband, Amanda Robbins.    Patient is accompained by: Family member   Pertinent History PRECAUTIONS: ACTIVE CANCER, CARDIAC , PULMONARY ( COPD, MVP,SVT, HX PULMONARY EMBOLISM) ,  Hx RLE cellulitis. Hx lumbar compression fx 2/2 fall and s/p recent kyphoplasty. Hx B wrist fx. Periferal neuropathy. osteoporosis. 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  S/P abdominal hhysterectomy and chemotheray,    Limitations difficulty walking, decreased balance, falls risk, needs varing levels of assistance and assitive devices to complete all sit to stand transfers, limited bed mobility, decreased AROM, decreased standing tolerance, decreased endurance and activity tolerance, postural limitations; needs assistance for all basic and instrumental ADLs. difficulty  sleeping   Special Tests Typical leiisure activities include reading, cooking, walking, biking   Patient Stated Goals reduce swelling so I can walk and do more. "Our anniversary is coming up and we're going to [facility]. I  would love to be able to ride a bike."   Currently in Pain? Yes   Pain Score 6    Pain Location Back   Pain Descriptors / Indicators Aching;Burning;Stabbing;Tender;Throbbing;Tightness;Tingling;Nagging;Numbness;Penetrating;Pins and needles;Pressure;Sharp;Sore;Grimacing;Dull;Discomfort;Constant;Other (Comment)  BLE: heavy, full, tight, sore, tender, tired   Pain Type Neuropathic pain;Intractable pain;Chronic pain   Pain Onset More than a month ago   Pain Frequency Constant   Aggravating Factors  standing , walking, bending,  repositioning   Pain Relieving Factors elevation   Effect of Pain on Daily Activities pain limits functional performance in all domains ( ADL, productive, leisure) and social participation at home and in the community   Multiple Pain Sites Yes           [facility] OT Assessment - 07/30/15 0001    Precautions   Precautions Fall;Other (comment)  LE PRECAUTIONS: CARDIAC, PULMONARY, [place] expects to be discharged to: Private residence   Living Arrangements Spouse/significant other   Available Help at Discharge Family   Type of [place] One level   Bathroom Shower/Tub Walk-in Shower   Bathroom Toilet Standard   Bathroom Accessibility Yes   How accessible Accessible via [place] - 4 wheels;Shower seat;Grab bars - tub/shower;Grab bars - toilet;Toilet riser;Hand held shower head;Wheelchair - manual   Lives With Spouse   Prior Function   Level of Independence Independent with basic ADLs;Independent with transfers;Independent with homemaking with ambulation;Requires assistive device for independence   Vocation Retired   Leisure go out to eat, shopping with husband, familty, grandchildren   Mobility   Mobility Status History of falls   Activity Tolerance   Activity Tolerance Tolerate 30+ min activity without fatigue   Cognition   Overall Cognitive Status Within Functional Limits for tasks assessed   Attention Focused;Sustained   Focused Attention Appears intact   Sustained Attention Appears intact   Memory Appears intact   ROM / Strength   AROM / PROM / Strength AROM;Strength   AROM   Overall AROM  Within functional limits for tasks performed   Strength   Overall Strength Deficits          LYMPHEDEMA/ONCOLOGY QUESTIONNAIRE - 07/30/15 1155    Type   Cancer Type ovarian   Treatment   Active Chemotherapy Treatment Yes   Past Chemotherapy Treatment Yes   Active Radiation Treatment No    Past Radiation Treatment Yes   Body Site brain   What other symptoms do you have   Are you Having Heaviness or Tightness Yes   Are you having pitting edema Yes   Body Site bilateral posterior medical thighs, mons pubis   Is it Hard or Difficult finding clothes that fit Yes   Do you have infections Yes   Is there Decreased scar mobility Yes   [place] Sign Yes   Other Symptoms Skin from toes to groin is tight, waxy and w/ thick 4+ pitting. Pt is very tender to palpation and boney prominences on feet / toes are reddened  and sore. Pt has open wound on R heel covered w/ dressing. Pt has small ~ 2 mm open area on anterior R leg with lymphorrhea. Pt presenting w/ extended pores associatted w/ peau de orange.    Lymphedema Stage   Stage STAGE 2 SPONTANEOUSLY IRREVERSIBLE  moderate/ severe stage 2, R>L   Lymphedema Assessments   Lymphedema Assessments Lower extremities  OT Treatments/Exercises (OP) - 07/30/15 0001    Manual Therapy   Manual Therapy Edema management   Manual therapy comments Visual assessment of limb volume differential appears to be ~ symmetrical swelling at 20%> normal bilaterally   Other Manual Therapy LE EDU               OT Education - 07/30/15 1201    Education provided Yes   Education Details rovided Pt/caregiver skilled education and ADL training throughout visit for lymphedema etiology, progression, and treatment including Intensive and Management Phase Complete Decongestive Therapy (CDT)  Discussed lymphedema precautions, cellulitis risk, and all CDT and LE self-care components, including compression wrapping/ garments & devices, lymphatic pumping ther ex, simple self-MLD, and skin care.    Person(s) Educated Patient;Spouse   Methods Explanation;Handout;Demonstration   Comprehension Verbalized understanding;Need further instruction           Short Term Clinic Goals - 10/06/14 1757    CC Short Term Goal  #1   Title short term  goals= long term goals           OT Long Term Goals - 07/30/15 1228    OT LONG TERM GOAL #1   Title Lymphedema (LE) self-care: Pt able to correctly apply gradient compression wraps toes to groin with maximum assistance from caregiver within 2 weeks for optimal limb volume reduction.   Baseline dependent   Time 2   Period Weeks   Status New   OT LONG TERM GOAL #2   Title Pt to achieve at least 10% LLE limb volume reduction to limit LE progression, decrease infection risk, to reduce foot and leg pain, and to improve functional mobility and transfers.   Baseline dependent   Time 12   Period Weeks   Status New   OT LONG TERM GOAL #3   Title Lymphedema (LE) self-care : Pt >/= 85 % compliant with all daily LE self-care protocols, including simple self-manual lymphatic drainage (MLD), skin care, lymphatic pumping therex, and donning/ doffing progression garments with needed level of caregiver assistance to limit progression, infection risk and further functional decline.     Baseline dependent   Time 12   Period Weeks   Status New   OT LONG TERM GOAL #4   Title Pt to tolerate compression garments and devices in keeping w/ prescribed wear regime within 1 week of issue date to retain clinical and functional gains and to limit progression.   Baseline dependent   Time 12   Period Weeks   Status New   OT LONG TERM GOAL #5   Title Pt to achieve swelling reduction in B feet and legs suicient to enable her to fit existing lace up tennis shoes to ride recumbent bike in clinic gym, in effort to safely siimulate riding a bike, by DC   Baseline dependent   Time 12   Period Weeks   Status New   Long Term Additional Goals   Additional Long Term Goals Yes   OT LONG TERM GOAL #6   Title During Management Phase CDT Pt to sustain limb volume reductions achieved during Intensive Phase CDT within 5% utilizing LE self-care protocols, appropriate compression garments/ devices, and needed level of  caregiver assistance.   Baseline dependent   Time 6   Period Months   Status New           Long Term Clinic Goals - 11/10/14 1104    CC Long Term Goal  #1   Title Patient  will verbalize understanding of lymphedema risk reduction practices   Status On-going   CC Long Term Goal  #3   Title Patient will be know how to obtain and use compression garments for maintenance phase of treatment   Status On-going            Plan - August 10, 2015 1223    Clinical Impression Statement Pt reports BLE LE onset ~ 6 months ago. Upon assessment, however, tissue texture and density is indicative of onset greater than 6 months.  Pt endorses worsening leg and back pain, worsening peripheral neuropathy, thickening and tightening skin, along with worsening swelling over last 6 months. Swelling has worsened over time and no longer resolves. BLE LE limits functional performance in all occupational domains, including basic and instrumental ADLs, (difficulty fitting LB clothing and street shoes, bathing, dressing, sleeping, home management, toileting) productive activities,  (difficulty with all activities requiring extended period of walking, standing and sitting, requires assistive device and CGA for walking  on level surfaces indoors and out; requires assistance and AD to complete all transfers) and LE limits participation in social and family activities, limits community participation ( attending church and community events), and contributes to decreased body image ( swelling is disfiguring). Without skilled Occupational Therapy for Intensive and Management phase Complete Decongestive Therapy (CDT) to address chronic, progressive BLE LE, this patient's condition is expected to worsen and further functional decline is likely.   Pt will benefit from skilled therapeutic intervention in order to improve on the following deficits (Retired) Abnormal gait;Decreased endurance;Decreased skin integrity;Decreased knowledge of  precautions;Improper body mechanics;Decreased activity tolerance;Decreased knowledge of use of DME;Decreased strength;Impaired flexibility;Improper spinal/pelvic alignment;Decreased balance;Decreased mobility;Difficulty walking;Impaired sensation;Increased edema;Pain   Rehab Potential Good   Clinical Impairments Affecting Rehab Potential back pain, fchemo-related fatigue   OT Frequency 3x / week   OT Duration 12 weeks   OT Treatment/Interventions Self-care/ADL training;Energy conservation;Compression bandaging;DME and/or AE instruction;Patient/family education;Therapist, nutritional;Therapeutic exercise;Manual Therapy;Manual lymph drainage;Therapeutic activities   Plan First 2 visits emphasis on Pt and caregiver edu for compression bandaging using gradient config. Commence CDT to RLE forst. Fit with appropriate compression garment once plateau in swelling reduction is achieved. Repeat for LLE. Modify CDT PRN as Pt about to commence 2nd round of chemo. Heavy emphasis in caegiver edu to  ensure continuity if missed visits occur   OT Home Exercise Plan LE pumping ther ex   Recommended Other Services Fit with custom BLE compression garments for HOS and dytime use. TBD   Consulted and Agree with Plan of Care Patient;Family member/caregiver   Family Member Consulted husband, Arvin Collard - 08/10/15 1235    Functional Assessment Tool Used linical observation and examination, medical records review, Pt and caregiver interview, comparative limb volumetrics   Functional Limitation Self care   Self Care Current Status [phone]) At least 78 percent but less than 100 percent impaired, limited or restricted   Self Care Goal Status [id number]) At least 40 percent but less than 60 percent impaired, limited or restricted      Problem List Patient Active Problem List   Diagnosis Date Noted  . Chest wall pain 07/08/2015  . Malnutrition of moderate degree 05/31/2015  . Lumbar compression fracture  ([place])   . Cellulitis of right lower extremity 05/29/2015  . Back pain 05/29/2015  . Macrocytic anemia 12/01/2014  . Ovarian cancer ([place]) 08/31/2014  . 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 Brain metastasis ([place]) 06/01/2014  .  Dizzinesses 05/26/2013  . Medicare annual wellness visit, subsequent 11/22/2011  . GERD (gastroesophageal reflux disease) 08/22/2011  . Neuropathy ([place]) 05/24/2011  . Insomnia 05/24/2011  . Osteoporosis 05/24/2011  . IBS (irritable bowel syndrome) 05/24/2011  . Migraine 05/24/2011  . Thrombocytopenia ([place]) 05/24/2011  . Hyperlipidemia 05/24/2011    Andrey Spearman, MS, OTR/L, [facility] 07/30/2015 12:41 PM   [place] MAIN [facility] SERVICES [address] [place], [place], [address] Phone: [phone]   Fax:  [phone]  Name: ARTHUR DECOOK MRN: [id number] Date of Birth: [birth year]

## 2015-07-30 NOTE — Patient Instructions (Signed)

## 2015-07-30 NOTE — Therapy (Signed)
[place] MAIN [facility] SERVICES [address] [place], [place], [address] Phone: [phone]   Fax:  [phone]  Occupational Therapy Treatment  Patient Details  Name: Amanda Robbins MRN: [id number] Date of Birth: [birth year] No Data Recorded  Encounter Date: 07/30/2015      OT End of Session - 07/30/15 1409    Visit Number 2   Number of Visits 36   Date for OT Re-Evaluation 10/28/15   OT Start Time 1000   OT Stop Time 1130   OT Time Calculation (min) 90 min   Equipment Utilized During Treatment LE Self Care Workbook   Activity Tolerance Patient tolerated treatment well;No increased pain;Patient limited by fatigue;Patient limited by pain   Behavior During Therapy [facility] for tasks assessed/performed      Past Medical History  Diagnosis Date  . 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 Irritable bowel   . C. difficile colitis 2011  . Allergic rhinitis     Followed by Dr. Donneta Romberg  . Esophageal reflux     Followed by GI physician, on Dexilant and Ranitidine  . Migraine   . 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 Osteoporosis 2009    on Reclast once yearly, Followed by Dr. Jefm Bryant  . Ovarian cancer ([place])   . COPD (chronic obstructive pulmonary disease) ([place])   . Thrombocytopenia ([place])   . MVP (mitral valve prolapse)   . SVT (supraventricular tachycardia) [facility])     Past Surgical History  Procedure Laterality Date  . Bil roken wrists    . Gallbladder removed    . Bilateral bunionectomies    . Cesarean section    . Abdominal hysterectomy    . Cholecystectomy    . Kyphoplasty N/A 05/31/2015    Procedure: KYPHOPLASTY L-2 and L-4;  Surgeon: Hessie Knows, MD;  Location: [facility] ORS;  Service: Orthopedics;  Laterality: N/A;    There were no vitals filed for this visit.  Visit Diagnosis:  Lymphedema of both lower extremities      Subjective Assessment - 07/30/15 1401    Subjective  Pt presents for OT  treatment visit 1 to address BLE LE. Pt is accompanied by her husband, Sam, and her daughter, Amy, who will assist with lymphedema care between visits. Both are here today to learn proper compression garment application using gradient configuration.   Patient is accompained by: Family member   Pertinent History PRECAUTIONS: ACTIVE CANCER, CARDIAC , PULMONARY ( COPD, MVP,SVT, HX PULMONARY EMBOLISM) ,  Hx RLE cellulitis. Hx lumbar compression fx 2/2 fall and s/p recent kyphoplasty. Hx B wrist fx. Periferal neuropathy. osteoporosis. 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  S/P abdominal hhysterectomy and chemotheray,    Limitations difficulty walking, decreased balance, falls risk, needs varing levels of assistance and assitive devices to complete all sit to stand transfers, limited bed mobility, decreased AROM, decreased standing tolerance, decreased endurance and activity tolerance, postural limitations; needs assistance for all basic and instrumental ADLs. difficulty  sleeping   Special Tests Typical leiisure activities include reading, cooking, walking, biking   Patient Stated Goals reduce swelling so I can walk and do more. "Our anniversary is coming up and we're going to [facility]. I would love to be able to ride a  bike."   Pain Score 6    Pain Onset More than a month ago            [facility] OT Assessment - 07/30/15 0001    Precautions   Precautions Fall;Other (comment)  LE PRECAUTIONS: CARDIAC, PULMONARY, SKIN   Home  Environment   Family/patient expects to be discharged to: Private residence   Living Arrangements Spouse/significant other   Available Help at Discharge Family   Type of [place] One level   Bathroom Shower/Tub Walk-in Shower   Bathroom Toilet Standard   Bathroom  Accessibility Yes   How accessible Accessible via [employer] operational officer - 4 wheels;Shower seat;Grab bars - tub/shower;Grab bars - toilet;Toilet riser;Hand held shower head;Wheelchair - manual   Lives With Spouse   Prior Function   Level of Independence Independent with basic ADLs;Independent with transfers;Independent with homemaking with ambulation;Requires assistive device for independence   Vocation Retired   Leisure go out to eat, shopping with husband, familty, grandchildren   Mobility   Mobility Status History of falls   Activity Tolerance   Activity Tolerance Tolerate 30+ min activity without fatigue   Cognition   Overall Cognitive Status Within Functional Limits for tasks assessed   Attention Focused;Sustained   Focused Attention Appears intact   Sustained Attention Appears intact   Memory Appears intact   ROM / Strength   AROM / PROM / Strength AROM;Strength   AROM   Overall AROM  Within functional limits for tasks performed   Strength   Overall Strength Deficits         LYMPHEDEMA/ONCOLOGY QUESTIONNAIRE - 07/30/15 1405    Right Lower Extremity Lymphedema   Other RLE below knee (AD) volume= 3494.16; Toes to groin (AG) vol= 9351.78   Other Limb volume differential (AD)=1.19%, L>R   Other AG LVD=4.17%, R>L   Left Lower Extremity Lymphedema   Other LLE AD vol= 3433.25; AG [id number] ml                  OT Treatments/Exercises (OP) - 07/30/15 1404    ADLs   ADL Education Given Yes   Manual Therapy   Manual Therapy Edema management;Compression Bandaging   Manual therapy comments Completed BLE comparative volumetrics   Other Manual Therapy LE EDU                OT Education - 07/30/15 1408    Education provided Yes   Education Details emphasis on Pt and caregive compression wraps application and reviewed LY precautions, including removing wraps  if Pt experiences acute pain, atypical SOB, or signs/ symptoms of infection.   Person(s)  Educated Patient;Spouse;Child(ren)   Methods Explanation;Demonstration;Tactile cues;Verbal cues;Handout   Comprehension Verbalized understanding;Returned demonstration;Verbal cues required;Tactile cues required;Need further instruction             OT Long Term Goals - 07/30/15 1228    OT LONG TERM GOAL #1   Title Lymphedema (LE) self-care: Pt able to correctly apply gradient compression wraps toes to groin with maximum assistance from caregiver within 2 weeks for optimal limb volume reduction.   Baseline dependent   Time 2   Period Weeks   Status New   OT LONG TERM GOAL #2   Title Pt to achieve at least 10% LLE limb volume reduction to limit LE progression, decrease infection risk, to reduce foot and leg pain, and to improve functional mobility and transfers.   Baseline dependent  Time 12   Period Weeks   Status New   OT LONG TERM GOAL #3   Title Lymphedema (LE) self-care : Pt >/= 85 % compliant with all daily LE self-care protocols, including simple self-manual lymphatic drainage (MLD), skin care, lymphatic pumping therex, and donning/ doffing progression garments with needed level of caregiver assistance to limit progression, infection risk and further functional decline.     Baseline dependent   Time 12   Period Weeks   Status New   OT LONG TERM GOAL #4   Title Pt to tolerate compression garments and devices in keeping w/ prescribed wear regime within 1 week of issue date to retain clinical and functional gains and to limit progression.   Baseline dependent   Time 12   Period Weeks   Status New   OT LONG TERM GOAL #5   Title Pt to achieve swelling reduction in B feet and legs suicient to enable her to fit existing lace up tennis shoes to ride recumbent bike in clinic gym, in effort to safely siimulate riding a bike, by DC   Baseline dependent   Time 12   Period Weeks   Status New   Long Term Additional Goals   Additional Long Term Goals Yes   OT LONG TERM GOAL #6    Title During Management Phase CDT Pt to sustain limb volume reductions achieved during Intensive Phase CDT within 5% utilizing LE self-care protocols, appropriate compression garments/ devices, and needed level of caregiver assistance.   Baseline dependent   Time 6   Period Months   Status New               Plan - 2015-08-26 1410    Clinical Impression Statement Pt tolerated treatment well today. Caregivers need additional instruction for all aspects of home program. Doristine Devoid start today and wonderful family support!   Pt will benefit from skilled therapeutic intervention in order to improve on the following deficits (Retired) Abnormal gait;Decreased endurance;Decreased skin integrity;Decreased knowledge of precautions;Improper body mechanics;Decreased activity tolerance;Decreased knowledge of use of DME;Decreased strength;Impaired flexibility;Improper spinal/pelvic alignment;Decreased balance;Decreased mobility;Difficulty walking;Impaired sensation;Increased edema;Pain   Rehab Potential Good   Clinical Impairments Affecting Rehab Potential back pain, fchemo-related fatigue   OT Frequency 3x / week   OT Duration 12 weeks   OT Treatment/Interventions Self-care/ADL training;Energy conservation;Compression bandaging;DME and/or AE instruction;Patient/family education;Therapist, nutritional;Therapeutic exercise;Manual Therapy;Manual lymph drainage;Therapeutic activities   OT Home Exercise Plan LE pumping ther ex   Consulted and Agree with Plan of Care Patient;Family member/caregiver   Family Member Consulted husband, [place] Collard - 2015-08-26 1235    Functional Assessment Tool Used linical observation and examination, medical records review, Pt and caregiver interview, comparative limb volumetrics   Functional Limitation Self care   Self Care Current Status [phone]) At least 52 percent but less than 100 percent impaired, limited or restricted   Self Care Goal Status [id number]) At  least 40 percent but less than 60 percent impaired, limited or restricted      Problem List Patient Active Problem List   Diagnosis Date Noted  . Chest wall pain 07/08/2015  . Malnutrition of moderate degree 05/31/2015  . Lumbar compression fracture ([place])   . Cellulitis of right lower extremity 05/29/2015  . Back pain 05/29/2015  . Macrocytic anemia 12/01/2014  . Ovarian cancer ([place]) 08/31/2014  . 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 Brain metastasis ([place]) 06/01/2014  . Dizzinesses 05/26/2013  .  Medicare annual wellness visit, subsequent 11/22/2011  . GERD (gastroesophageal reflux disease) 08/22/2011  . Neuropathy ([place]) 05/24/2011  . Insomnia 05/24/2011  . Osteoporosis 05/24/2011  . IBS (irritable bowel syndrome) 05/24/2011  . Migraine 05/24/2011  . Thrombocytopenia ([place]) 05/24/2011  . Hyperlipidemia 05/24/2011    Andrey Spearman, MS, OTR/L, [facility] 07/30/2015 2:12 PM   [place] MAIN [facility] SERVICES [address] [place], [place], [address] Phone: [phone]   Fax:  [phone]  Name: ABRIELA BEFORT MRN: [id number] Date of Birth: [birth year]

## 2015-08-01 ENCOUNTER — Other Ambulatory Visit: Payer: Self-pay | Admitting: Family Medicine

## 2015-08-02 ENCOUNTER — Encounter: Payer: Medicare Other | Admitting: Occupational Therapy

## 2015-08-02 ENCOUNTER — Ambulatory Visit: Payer: Medicare Other | Admitting: Occupational Therapy

## 2015-08-02 DIAGNOSIS — I890 Lymphedema, not elsewhere classified: Secondary | ICD-10-CM | POA: Diagnosis not present

## 2015-08-02 NOTE — Therapy (Signed)
[place] MAIN [facility] SERVICES [address] [place], [place], [address] Phone: [phone]   Fax:  [phone]  Occupational Therapy Treatment  Patient Details  Name: Amanda Robbins MRN: [id number] Date of Birth: 06/15/1940 No Data Recorded  Encounter Date: 08/02/2015      OT End of Session - 08/02/15 1400    Visit Number 3   Number of Visits 36   Date for OT Re-Evaluation 10/28/15   OT Start Time 1109   OT Stop Time 1220   OT Time Calculation (min) 71 min   Equipment Utilized During Treatment LE Self Care Workbook   Activity Tolerance Patient tolerated treatment well;Patient limited by pain      Past Medical History  Diagnosis Date  . 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 Irritable bowel   . C. difficile colitis 2011  . Allergic rhinitis     Followed by Dr. Donneta Romberg  . Esophageal reflux     Followed by GI physician, on Dexilant and Ranitidine  . Migraine   . 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 Osteoporosis 2009    on Reclast once yearly, Followed by Dr. Jefm Bryant  . Ovarian cancer ([place])   . COPD (chronic obstructive pulmonary disease) ([place])   . Thrombocytopenia ([place])   . MVP (mitral valve prolapse)   . SVT (supraventricular tachycardia) [facility])     Past Surgical History  Procedure Laterality Date  . Bil roken wrists    . Gallbladder removed    . Bilateral bunionectomies    . Cesarean section    . Abdominal hysterectomy    . Cholecystectomy    . Kyphoplasty N/A 05/31/2015    Procedure: KYPHOPLASTY L-2 and L-4;  Surgeon: Hessie Knows, MD;  Location: [facility] ORS;  Service: Orthopedics;  Laterality: N/A;    There were no vitals filed for this visit.  Visit Diagnosis:  Lymphedema of both lower extremities      Subjective Assessment - 08/02/15 1340    Subjective  Pt presents for OT treatment visit 3 to address BLE LE. Pt is accompanied by her husband, Sam. She reports she had no  difficulty tolerating compression wraps over the weekend and her daughter came over to practice wrapping. She reports that she noted an immediate limb volume reduction after initial 24 hours. Today she is reporting severe pain in her R foot. aftter wearing tight shoe over the weekend.   Patient is accompained by: Family member   Pertinent History PRECAUTIONS: ACTIVE CANCER, CARDIAC , PULMONARY ( COPD, MVP,SVT, HX PULMONARY EMBOLISM) ,  Hx RLE cellulitis. Hx lumbar compression fx 2/2 fall and s/p recent kyphoplasty. Hx B wrist fx. Periferal neuropathy. osteoporosis. 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  S/P abdominal hhysterectomy and chemotheray,    Limitations difficulty walking, decreased balance, falls risk, needs varing levels of assistance and assitive devices to complete all sit to stand transfers, limited bed mobility, decreased AROM, decreased standing tolerance, decreased endurance and activity tolerance, postural limitations; needs assistance for all basic and instrumental ADLs. difficulty  sleeping   Special Tests Typical leiisure activities include reading, cooking, walking, biking   Patient Stated Goals reduce swelling so I can walk and do more. "Our anniversary is coming up and we're  going to [place]. I would love to be able to ride a bike."   Currently in Pain? Yes  not rated numerically   Pain Location Foot   Pain Orientation Right   Pain Descriptors / Indicators Constant;Aching;Pounding;Sore   Pain Type Acute pain   Pain Onset More than a month ago   Pain Frequency Constant   Aggravating Factors  shoes, weight bearing   Effect of Pain on Daily Activities unable to ambulate, increased difficulty w/ stand pivot transfers,                       OT Treatments/Exercises (OP) - 08/02/15 1352     ADLs   ADL Education Given Yes                OT Education - 08/02/15 1353    Education provided Yes   Education Details Emphasis of LE ADL training today on patient and caregiver edu for proper compression wraps application using  Circumferential, gradient techniques and proper positioning. By end of session caregiver was able to aply LLE full length wrap with min A, verbal and tactile cues. Pt able to provide direction as needed.   Person(s) Educated Patient;Spouse   Methods Explanation;Demonstration;Tactile cues;Verbal cues;Handout   Comprehension Verbalized understanding;Returned demonstration;Verbal cues required;Tactile cues required;Need further instruction             OT Long Term Goals - 07/30/15 1228    OT LONG TERM GOAL #1   Title Lymphedema (LE) self-care: Pt able to correctly apply gradient compression wraps toes to groin with maximum assistance from caregiver within 2 weeks for optimal limb volume reduction.   Baseline dependent   Time 2   Period Weeks   Status New   OT LONG TERM GOAL #2   Title Pt to achieve at least 10% LLE limb volume reduction to limit LE progression, decrease infection risk, to reduce foot and leg pain, and to improve functional mobility and transfers.   Baseline dependent   Time 12   Period Weeks   Status New   OT LONG TERM GOAL #3   Title Lymphedema (LE) self-care : Pt >/= 85 % compliant with all daily LE self-care protocols, including simple self-manual lymphatic drainage (MLD), skin care, lymphatic pumping therex, and donning/ doffing progression garments with needed level of caregiver assistance to limit progression, infection risk and further functional decline.     Baseline dependent   Time 12   Period Weeks   Status New   OT LONG TERM GOAL #4   Title Pt to tolerate compression garments and devices in keeping w/ prescribed wear regime within 1 week of issue date to retain clinical and functional gains and to limit progression.    Baseline dependent   Time 12   Period Weeks   Status New   OT LONG TERM GOAL #5   Title Pt to achieve swelling reduction in B feet and legs suicient to enable her to fit existing lace up tennis shoes to ride recumbent bike in clinic gym, in effort to safely siimulate riding a bike, by DC   Baseline dependent   Time 12   Period Weeks   Status New   Long Term Additional Goals   Additional Long Term Goals Yes   OT LONG TERM GOAL #6   Title During Management Phase CDT Pt to sustain limb volume reductions achieved during Intensive Phase CDT within 5% utilizing LE self-care protocols, appropriate compression garments/ devices, and  needed level of caregiver assistance.   Baseline dependent   Time 6   Period Months   Status New               Plan - 08/02/15 1403    Clinical Impression Statement RLE swelling and tissue density is mildly decreased since wrapping over the weekend. Pt c/o soreness, pain, and difficulty weight bearing on R foot after wearing tight shoes to a party yesterday. We opted t practice LLE compression wrapping w/ spouse to limit discomfort. By end of session Mr. Gelin was able to apply compression wraps using proper techniques to achieve needed gradient with min assistance and verbal and tactile cues.  Examination of the right foot  raises significant concewrns re suspected cellulitis infection. Skin is red and warm at dorsolateral aspect and Pt  presents w/ increased swellingg mostly containd in foot. Pt and spouse were directed to seek medical attention immediately. They assured me they would call Pt's PCP after this OT apt. Reviewed LE precautions and Pt confirned understanding.   Pt will benefit from skilled therapeutic intervention in order to improve on the following deficits (Retired) Abnormal gait;Decreased endurance;Decreased skin integrity;Decreased knowledge of precautions;Improper body mechanics;Decreased activity tolerance;Decreased knowledge of use of  DME;Decreased strength;Impaired flexibility;Improper spinal/pelvic alignment;Decreased balance;Decreased mobility;Difficulty walking;Impaired sensation;Increased edema;Pain   Rehab Potential Good   Clinical Impairments Affecting Rehab Potential back pain, fchemo-related fatigue   OT Duration 12 weeks   OT Treatment/Interventions Self-care/ADL training;Energy conservation;Compression bandaging;DME and/or AE instruction;Patient/family education;Therapist, nutritional;Therapeutic exercise;Manual Therapy;Manual lymph drainage;Therapeutic activities   OT Home Exercise Plan LE pumping ther ex   Consulted and Agree with Plan of Care Patient;Family member/caregiver   Family Member Consulted husband, Sam        Problem List Patient Active Problem List   Diagnosis Date Noted  . Chest wall pain 07/08/2015  . Malnutrition of moderate degree 05/31/2015  . Lumbar compression fracture ([place])   . Cellulitis of right lower extremity 05/29/2015  . Back pain 05/29/2015  . Macrocytic anemia 12/01/2014  . Ovarian cancer ([place]) 08/31/2014  . 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 Brain metastasis ([place]) 06/01/2014  . Dizzinesses 05/26/2013  . Medicare annual wellness visit, subsequent 11/22/2011  . GERD (gastroesophageal reflux disease) 08/22/2011  . Neuropathy ([place]) 05/24/2011  . Insomnia 05/24/2011  . Osteoporosis 05/24/2011  . IBS (irritable bowel syndrome) 05/24/2011  . Migraine 05/24/2011  . Thrombocytopenia ([place]) 05/24/2011  . Hyperlipidemia 05/24/2011    Andrey Spearman, MS, OTR/L, [facility] 08/02/2015 4:22 PM    [place] MAIN [facility] SERVICES [address] [place], [place], [address] Phone: [phone]   Fax:  [phone]  Name: Amanda Robbins MRN: [id number] Date of Birth: Apr 04, 1941

## 2015-08-03 ENCOUNTER — Inpatient Hospital Stay (HOSPITAL_BASED_OUTPATIENT_CLINIC_OR_DEPARTMENT_OTHER): Payer: Medicare Other | Admitting: Oncology

## 2015-08-03 ENCOUNTER — Inpatient Hospital Stay: Payer: Medicare Other

## 2015-08-03 ENCOUNTER — Inpatient Hospital Stay: Payer: Medicare Other | Attending: Oncology

## 2015-08-03 VITALS — BP 99/69 | HR 109 | Temp 99.2°F | Resp 16 | Wt 134.5 lb

## 2015-08-03 DIAGNOSIS — R5383 Other fatigue: Secondary | ICD-10-CM

## 2015-08-03 DIAGNOSIS — R531 Weakness: Secondary | ICD-10-CM

## 2015-08-03 DIAGNOSIS — C569 Malignant neoplasm of unspecified ovary: Secondary | ICD-10-CM

## 2015-08-03 DIAGNOSIS — I890 Lymphedema, not elsewhere classified: Secondary | ICD-10-CM

## 2015-08-03 DIAGNOSIS — R5381 Other malaise: Secondary | ICD-10-CM | POA: Insufficient documentation

## 2015-08-03 DIAGNOSIS — M549 Dorsalgia, unspecified: Secondary | ICD-10-CM

## 2015-08-03 DIAGNOSIS — D696 Thrombocytopenia, unspecified: Secondary | ICD-10-CM | POA: Insufficient documentation

## 2015-08-03 DIAGNOSIS — R971 Elevated cancer antigen 125 [CA 125]: Secondary | ICD-10-CM | POA: Insufficient documentation

## 2015-08-03 DIAGNOSIS — R600 Localized edema: Secondary | ICD-10-CM | POA: Diagnosis not present

## 2015-08-03 DIAGNOSIS — Z792 Long term (current) use of antibiotics: Secondary | ICD-10-CM

## 2015-08-03 DIAGNOSIS — Z79899 Other long term (current) drug therapy: Secondary | ICD-10-CM | POA: Diagnosis not present

## 2015-08-03 DIAGNOSIS — I341 Nonrheumatic mitral (valve) prolapse: Secondary | ICD-10-CM | POA: Insufficient documentation

## 2015-08-03 DIAGNOSIS — M109 Gout, unspecified: Secondary | ICD-10-CM | POA: Insufficient documentation

## 2015-08-03 DIAGNOSIS — C7931 Secondary malignant neoplasm of brain: Secondary | ICD-10-CM

## 2015-08-03 DIAGNOSIS — L03115 Cellulitis of right lower limb: Secondary | ICD-10-CM | POA: Insufficient documentation

## 2015-08-03 DIAGNOSIS — K589 Irritable bowel syndrome without diarrhea: Secondary | ICD-10-CM | POA: Diagnosis not present

## 2015-08-03 DIAGNOSIS — Z9221 Personal history of antineoplastic chemotherapy: Secondary | ICD-10-CM | POA: Diagnosis not present

## 2015-08-03 DIAGNOSIS — K219 Gastro-esophageal reflux disease without esophagitis: Secondary | ICD-10-CM | POA: Diagnosis not present

## 2015-08-03 DIAGNOSIS — R630 Anorexia: Secondary | ICD-10-CM | POA: Insufficient documentation

## 2015-08-03 DIAGNOSIS — Z87891 Personal history of nicotine dependence: Secondary | ICD-10-CM | POA: Diagnosis not present

## 2015-08-03 LAB — CBC WITH DIFFERENTIAL/PLATELET
BASOS ABS: 0 10*3/uL (ref 0–0.1)
Basophils Relative: 0 %
EOS ABS: 0 10*3/uL (ref 0–0.7)
EOS PCT: 0 %
HCT: 39.3 % (ref 35.0–47.0)
HEMOGLOBIN: 13.7 g/dL (ref 12.0–16.0)
Lymphocytes Relative: 6 %
Lymphs Abs: 0.4 10*3/uL — ABNORMAL LOW (ref 1.0–3.6)
MCH: 36 pg — ABNORMAL HIGH (ref 26.0–34.0)
MCHC: 34.8 g/dL (ref 32.0–36.0)
MCV: 103.4 fL — ABNORMAL HIGH (ref 80.0–100.0)
Monocytes Absolute: 0.1 10*3/uL — ABNORMAL LOW (ref 0.2–0.9)
Monocytes Relative: 2 %
NEUTROS PCT: 92 %
Neutro Abs: 5.6 10*3/uL (ref 1.4–6.5)
Platelets: 107 10*3/uL — ABNORMAL LOW (ref 150–440)
RBC: 3.8 MIL/uL (ref 3.80–5.20)
RDW: 15 % — ABNORMAL HIGH (ref 11.5–14.5)
WBC: 6.1 10*3/uL (ref 3.6–11.0)

## 2015-08-03 LAB — BASIC METABOLIC PANEL
ANION GAP: 4 — AB (ref 5–15)
BUN: 22 mg/dL — AB (ref 6–20)
CHLORIDE: 103 mmol/L (ref 101–111)
CO2: 22 mmol/L (ref 22–32)
Calcium: 7.8 mg/dL — ABNORMAL LOW (ref 8.9–10.3)
Creatinine, Ser: 0.71 mg/dL (ref 0.44–1.00)
GFR calc Af Amer: >60 mL/min (ref >60)
GFR calc non Af Amer: >60 mL/min (ref >60)
GLUCOSE: 128 mg/dL — AB (ref 65–99)
POTASSIUM: 3.5 mmol/L (ref 3.5–5.1)
Sodium: 129 mmol/L — ABNORMAL LOW (ref 135–145)

## 2015-08-03 MED ORDER — SODIUM CHLORIDE 0.9 % IJ SOLN
10.0000 mL | INTRAMUSCULAR | Status: DC | PRN
  Administered 2015-08-03: 10 mL
  Filled 2015-08-03: qty 10

## 2015-08-03 MED ORDER — HEPARIN SOD (PORK) LOCK FLUSH 100 UNIT/ML IV SOLN
500.0000 [IU] | Freq: Once | INTRAVENOUS | Status: AC | PRN
  Administered 2015-08-03: 500 [IU]

## 2015-08-03 NOTE — Progress Notes (Signed)
Patient is having her legs wrapped at the lymphedema clinic and they noticed her right foot had cellulitis so her PCP prescribed an antibiotic.  She has been feeling more weak today than usual so her husband took her blood pressure at home with a low reading of 87/69 and it is low today at 99/69.  Back pain is 6/10 on standing and when she has to continually move positions while sitting to avoid increased pain.

## 2015-08-04 ENCOUNTER — Telehealth: Payer: Self-pay | Admitting: *Deleted

## 2015-08-04 ENCOUNTER — Encounter: Payer: Medicare Other | Admitting: Occupational Therapy

## 2015-08-04 ENCOUNTER — Other Ambulatory Visit: Payer: Self-pay | Admitting: *Deleted

## 2015-08-04 DIAGNOSIS — C569 Malignant neoplasm of unspecified ovary: Secondary | ICD-10-CM

## 2015-08-04 DIAGNOSIS — C7931 Secondary malignant neoplasm of brain: Secondary | ICD-10-CM

## 2015-08-04 LAB — CA 125: CA 125: 179.9 U/mL — AB (ref 0.0–38.1)

## 2015-08-04 NOTE — Telephone Encounter (Signed)
Call placed to Amanda Robbins to inform him of CA 125 results, significantly increased at this time. Patient and husband in agreement with plan moving forward to include: MRI/PET next week and follow up in 2 weeks to restart chemotherapy.

## 2015-08-05 ENCOUNTER — Ambulatory Visit: Payer: Medicare Other | Admitting: Occupational Therapy

## 2015-08-06 ENCOUNTER — Ambulatory Visit: Payer: Medicare Other | Admitting: Internal Medicine

## 2015-08-06 ENCOUNTER — Ambulatory Visit: Payer: Medicare Other | Admitting: Occupational Therapy

## 2015-08-08 NOTE — Progress Notes (Signed)
[place]  [phone] [phone]  ID: Amanda Robbins OB: 1940-10-06  MR#: [id number]  [id number]  Patient Care Team: Jackolyn Confer, MD as PCP - General (Internal Medicine)  CHIEF COMPLAINT:  Chief Complaint  Patient presents with  . Ovarian Cancer    INTERVAL HISTORY: Patient returns to clinic today for laboratory work and further evaluation. She continues to have problems with lower extremity lymphedema and is being evaluated in the lymphedema clinic. She also has cellulitis in her right foot and is currently on antibiotics. She has increased weakness and fatigue. She also continues to have back pain at approximately 6 out of 10 particularly with standing, but this is essentially unchanged from her recent kyphoplasty 2. She denies any fevers. She denies any chest pain or shortness of breath. She has a fair appetite and denies weight loss. She denies any nausea, vomiting, or constipation. Patient offers no further specific complaints.   REVIEW OF SYSTEMS:   Review of Systems  Constitutional: Positive for malaise/fatigue. Negative for fever and weight loss.  Cardiovascular: Positive for leg swelling.  Gastrointestinal: Negative for nausea and diarrhea.  Genitourinary: Negative.   Musculoskeletal: Positive for back pain.  Neurological: Positive for sensory change and weakness.    As per HPI. Otherwise, a complete review of systems is negatve.  PAST MEDICAL HISTORY: Past Medical History  Diagnosis Date  . Ischemic bowel syndrome ([place]) 06/2010  . Irritable bowel   . C. difficile colitis 2011  . Allergic rhinitis     Followed by Dr. Donneta Romberg  . Esophageal reflux     Followed by GI physician, on Dexilant and Ranitidine  . Migraine   . 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 Osteoporosis 2009    on Reclast once yearly, Followed by Dr. Jefm Bryant  . Ovarian cancer ([place])   . COPD (chronic obstructive  pulmonary disease) ([place])   . Thrombocytopenia ([place])   . MVP (mitral valve prolapse)   . SVT (supraventricular tachycardia) ([place])     PAST SURGICAL HISTORY: Past Surgical History  Procedure Laterality Date  . Bil roken wrists    . Gallbladder removed    . Bilateral bunionectomies    . Cesarean section    . Abdominal hysterectomy    . Cholecystectomy    . Kyphoplasty N/A 05/31/2015    Procedure: KYPHOPLASTY L-2 and L-4;  Surgeon: Hessie Knows, MD;  Location: [facility] ORS;  Service: Orthopedics;  Laterality: N/A;    FAMILY HISTORY: Unchanged. No reported history of malignancy or chronic disease.     ADVANCED DIRECTIVES:    HEALTH MAINTENANCE: Social History  Substance Use Topics  . Smoking status: Former Smoker    Quit date: 06/12/1989  . Smokeless tobacco: Never Used  . Alcohol Use: No     Colonoscopy:  PAP:  Bone density:  Lipid panel:  Allergies  Allergen Reactions  . Aspirin     Other reaction(s): Localized superficial swelling of skin uncoated asa-causes throat to swell up  . Codeine Itching    Other reaction(s): Itching of Skin  . Fosamax  [Alendronate Sodium]     Other reaction(s): Distress (finding)  . Ibuprofen   . Ibuprofen     Other reaction(s): Distress (finding)  . Metronidazole Nausea And Vomiting  . Naproxen     Other reaction(s): Distress (finding)  . Naproxen Sodium   . Nsaids     GI UPSET / GERD  . Oxycodone  Other reaction(s): Diarrhea and vomiting (finding)  . Oxycodone-Acetaminophen     Other reaction(s): Diarrhea and vomiting (finding)    Current Outpatient Prescriptions  Medication Sig Dispense Refill  . acetaminophen (TYLENOL) 500 MG tablet Take 1 tablet (500 mg total) by mouth every 6 (six) hours as needed for moderate pain. 20 tablet 0  . Calcium Carbonate (CALCARB 600 PO) Take 600 mg by mouth 2 (two) times daily.    . cephALEXin (KEFLEX) 500 MG capsule Take by mouth.    . cyclobenzaprine (FLEXERIL) 5 MG tablet Take 1 tablet (5  mg total) by mouth 3 (three) times daily as needed for muscle spasms. 20 tablet 0  . dexamethasone (DECADRON) 4 MG tablet Take 1 tablet (4 mg total) by mouth daily. 60 tablet 2  . DimenhyDRINATE (DRAMAMINE PO) Take 1 tablet by mouth daily as needed (motion sickness.).     Marland Kitchen Diphenhyd-Hydrocort-Nystatin (FIRST-DUKES MOUTHWASH) SUSP Use as directed 5 mLs in the mouth or throat 4 (four) times daily as needed. 1 Bottle 1  . ELIQUIS 5 MG TABS tablet take 1 tablet by mouth twice a day 60 tablet 0  . furosemide (LASIX) 20 MG tablet take 1 tablet by mouth once daily 30 tablet 5  . lidocaine (LIDODERM) 5 % Place 1 patch onto the skin daily. Remove & Discard patch within 12 hours or as directed by MD 30 patch 0  . Multiple Vitamin (MULTIVITAMIN) tablet Take 1 tablet by mouth daily.      . naproxen sodium (ALEVE) 220 MG tablet Take 220 mg by mouth 2 (two) times daily as needed (for pain.).    Marland Kitchen omeprazole (PRILOSEC) 20 MG capsule take 1 capsule by mouth twice a day 60 capsule 5  . phenazopyridine (PYRIDIUM) 200 MG tablet Take 1 tablet (200 mg total) by mouth 3 (three) times daily as needed for pain. 10 tablet 0  . potassium chloride SA (K-DUR,KLOR-CON) 20 MEQ tablet take 1 tablet by mouth twice a day 60 tablet 1  . Probiotic Product (PRO-BIOTIC BLEND) CAPS Take 1 capsule by mouth daily.    Marland Kitchen topiramate (TOPAMAX) 25 MG tablet TAKE 1-2 TABLETS BY MOUTH DAILY (Patient taking differently: TAKE 1 TABLETS BY MOUTH DAILY) 60 tablet 5  . traMADol (ULTRAM) 50 MG tablet Take 1-2 tablets (50-100 mg total) by mouth 3 (three) times daily as needed. 120 tablet 3  . zolpidem (AMBIEN) 5 MG tablet as directed take 1/2 to 1 tablet by mouth at bedtime if needed for sleep 30 tablet 1  . [DISCONTINUED] dexlansoprazole (DEXILANT) 60 MG capsule Take 60 mg by mouth daily.       No current facility-administered medications for this visit.    OBJECTIVE: Filed Vitals:   08/03/15 1125  BP: 99/69  Pulse: 109  Temp: 99.2 F (37.3  C)  Resp: 16     Body mass index is 21.72 kg/(m^2).    ECOG FS:2 - Symptomatic, <50% confined to bed  General: Well-developed, well-nourished, no acute distress.  Eyes: anicteric sclera. Lungs: Clear to auscultation bilaterally. Heart: Regular rate and rhythm. No rubs, murmurs, or gallops. Abdomen: Soft, nontender, nondistended. No organomegaly noted, normoactive bowel sounds. Musculoskeletal: 2-3+ bilateral lower extremity edema. Neuro: Alert, answering all questions appropriately. Cranial nerves grossly intact. Skin: No rashes or petechiae noted. Psych: Normal affect.   LAB RESULTS:  Lab Results  Component Value Date   NA 129* 08/03/2015   K 3.5 08/03/2015   CL 103 08/03/2015   CO2 22 08/03/2015  GLUCOSE 128* 08/03/2015   BUN 22* 08/03/2015   CREATININE 0.71 08/03/2015   CALCIUM 7.8* 08/03/2015   PROT 5.5* 07/03/2015   ALBUMIN 2.9* 07/03/2015   AST 23 07/03/2015   ALT 23 07/03/2015   ALKPHOS 88 07/03/2015   BILITOT 1.0 07/03/2015   GFRNONAA >60 08/03/2015   GFRAA >60 08/03/2015    Lab Results  Component Value Date   WBC 6.1 08/03/2015   NEUTROABS 5.6 08/03/2015   HGB 13.7 08/03/2015   HCT 39.3 08/03/2015   MCV 103.4* 08/03/2015   PLT 107* 08/03/2015   Lab Results  Component Value Date   CA125 179.9* 08/03/2015     STUDIES: No results found.  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  PLAN:    1. Ovarian cancer:  Patient's CA-125 has significantly increased to approximately 180. Will get a PET scan as well as MRI the brain for restaging purposes. Patient will return to clinic in approximately 2 weeks to discuss reinitiating treatment. Patient's performance status is declining and she has multiple medical problems that may make treatment moving forward difficult. Patient expresses she wishes to pursue palliative treatment with Taxotere 25 mg/m2 on days 1, 8, 15 with day 22 off provided her pancytopenia remains  adequate to treat.  2. Peripheral edema: Likely multifactorial.   Continue treatment per lymphedema clinic. 3. Cellulitis: Continue antibiotics as prescribed. 4. Weakness and fatigue/decreased performance status: Unchanged, monitor. 4. Thrombocytopenia:  Improved since discontinuing chemotherapy.  5. Back pain: Kyphoplasty 2 as above. Treatment per orthopedics.  Approximately 30 minutes was spent in discussion of which greater than 50% was consultation.   Patient expressed understanding and was in agreement with this plan. She also understands that She can call clinic at any time with any questions, concerns, or complaints.    Lloyd Huger, MD   08/08/2015 8:04 AM

## 2015-08-09 ENCOUNTER — Inpatient Hospital Stay (HOSPITAL_BASED_OUTPATIENT_CLINIC_OR_DEPARTMENT_OTHER): Payer: Medicare Other | Admitting: Oncology

## 2015-08-09 ENCOUNTER — Telehealth: Payer: Self-pay | Admitting: *Deleted

## 2015-08-09 ENCOUNTER — Ambulatory Visit: Payer: Medicare Other | Admitting: Occupational Therapy

## 2015-08-09 VITALS — BP 111/73 | HR 114 | Temp 99.0°F | Resp 18 | Wt 146.0 lb

## 2015-08-09 DIAGNOSIS — Z9221 Personal history of antineoplastic chemotherapy: Secondary | ICD-10-CM | POA: Diagnosis not present

## 2015-08-09 DIAGNOSIS — C7931 Secondary malignant neoplasm of brain: Secondary | ICD-10-CM | POA: Diagnosis not present

## 2015-08-09 DIAGNOSIS — R971 Elevated cancer antigen 125 [CA 125]: Secondary | ICD-10-CM | POA: Diagnosis not present

## 2015-08-09 DIAGNOSIS — C569 Malignant neoplasm of unspecified ovary: Secondary | ICD-10-CM | POA: Diagnosis not present

## 2015-08-09 DIAGNOSIS — R630 Anorexia: Secondary | ICD-10-CM

## 2015-08-09 DIAGNOSIS — L03115 Cellulitis of right lower limb: Secondary | ICD-10-CM

## 2015-08-09 DIAGNOSIS — D696 Thrombocytopenia, unspecified: Secondary | ICD-10-CM

## 2015-08-09 DIAGNOSIS — R600 Localized edema: Secondary | ICD-10-CM

## 2015-08-09 DIAGNOSIS — M109 Gout, unspecified: Secondary | ICD-10-CM

## 2015-08-09 DIAGNOSIS — I890 Lymphedema, not elsewhere classified: Secondary | ICD-10-CM

## 2015-08-09 DIAGNOSIS — R5383 Other fatigue: Secondary | ICD-10-CM

## 2015-08-09 DIAGNOSIS — R5381 Other malaise: Secondary | ICD-10-CM

## 2015-08-09 DIAGNOSIS — Z792 Long term (current) use of antibiotics: Secondary | ICD-10-CM

## 2015-08-09 DIAGNOSIS — R531 Weakness: Secondary | ICD-10-CM

## 2015-08-09 MED ORDER — MORPHINE SULFATE 15 MG PO TABS: 15.0000 mg | ORAL_TABLET | ORAL | Status: DC | PRN

## 2015-08-09 NOTE — Progress Notes (Signed)
Patient here today as acute add on for pain in her left foot.  Saw Dr. Loney Hering last week for cellulitis and gout.  Given Colchicine that was later changed to prednisone  10 mg.  Patient alrealy taking  Decadron 4 mg daily.  Left leg red and swollen, patient states is very painful 10/10, although she states it is much better.

## 2015-08-09 NOTE — Telephone Encounter (Signed)
Having horrible pain and wants to be seen today.  Appt agreed on at 130 today

## 2015-08-11 ENCOUNTER — Telehealth: Payer: Self-pay | Admitting: *Deleted

## 2015-08-11 ENCOUNTER — Other Ambulatory Visit: Payer: Self-pay | Admitting: Family Medicine

## 2015-08-11 ENCOUNTER — Ambulatory Visit: Payer: Medicare Other | Admitting: Occupational Therapy

## 2015-08-11 NOTE — Telephone Encounter (Signed)
Inquiring about a referral to someone for the blister on her ankle

## 2015-08-12 ENCOUNTER — Ambulatory Visit
Admission: RE | Admit: 2015-08-12 | Discharge: 2015-08-12 | Disposition: A | Discharge status: Home / Self Care | Payer: Medicare Other | Source: Ambulatory Visit | Attending: Oncology | Admitting: Oncology

## 2015-08-12 DIAGNOSIS — G939 Disorder of brain, unspecified: Secondary | ICD-10-CM | POA: Diagnosis not present

## 2015-08-12 DIAGNOSIS — Z923 Personal history of irradiation: Secondary | ICD-10-CM | POA: Diagnosis present

## 2015-08-12 DIAGNOSIS — R911 Solitary pulmonary nodule: Secondary | ICD-10-CM | POA: Insufficient documentation

## 2015-08-12 DIAGNOSIS — R9082 White matter disease, unspecified: Secondary | ICD-10-CM | POA: Diagnosis not present

## 2015-08-12 DIAGNOSIS — C778 Secondary and unspecified malignant neoplasm of lymph nodes of multiple regions: Secondary | ICD-10-CM | POA: Diagnosis not present

## 2015-08-12 DIAGNOSIS — C7931 Secondary malignant neoplasm of brain: Secondary | ICD-10-CM | POA: Diagnosis present

## 2015-08-12 DIAGNOSIS — C569 Malignant neoplasm of unspecified ovary: Secondary | ICD-10-CM | POA: Insufficient documentation

## 2015-08-12 LAB — GLUCOSE, CAPILLARY: Glucose-Capillary: 76 mg/dL (ref 65–99)

## 2015-08-12 MED ORDER — FLUDEOXYGLUCOSE F - 18 (FDG) INJECTION
12.4800 | Freq: Once | INTRAVENOUS | Status: AC | PRN
  Administered 2015-08-12: 12.48 via INTRAVENOUS

## 2015-08-12 MED ORDER — GADOBENATE DIMEGLUMINE 529 MG/ML IV SOLN
15.0000 mL | Freq: Once | INTRAVENOUS | Status: AC | PRN
  Administered 2015-08-12: 11 mL via INTRAVENOUS

## 2015-08-12 NOTE — Telephone Encounter (Signed)
Advised him to contact Dr Loney Hering regarding this since he is treating her for this already

## 2015-08-12 NOTE — Telephone Encounter (Signed)
Wound looked clean, but can send to wound care center if desired.  Thanks.

## 2015-08-13 ENCOUNTER — Ambulatory Visit: Payer: Medicare Other | Attending: Oncology | Admitting: Occupational Therapy

## 2015-08-13 DIAGNOSIS — I890 Lymphedema, not elsewhere classified: Secondary | ICD-10-CM | POA: Insufficient documentation

## 2015-08-13 NOTE — Patient Instructions (Signed)
LE instructions and precautions as established- see initial eval.   

## 2015-08-13 NOTE — Therapy (Signed)
[place] MAIN [facility] SERVICES [address] [place], [place], [address] Phone: [phone]   Fax:  [phone]  Occupational Therapy Treatment  Patient Details  Name: Amanda Robbins MRN: [id number] Date of Birth: 1941-06-05 No Data Recorded  Encounter Date: 08/13/2015      OT End of Session - 08/13/15 1142    Visit Number 4   Number of Visits 36   Date for OT Re-Evaluation 10/28/15   OT Start Time 0904   OT Stop Time 1015   OT Time Calculation (min) 71 min   Equipment Utilized During Treatment LE Self Care Workbook   Activity Tolerance Patient tolerated treatment well;Patient limited by pain      Past Medical History  Diagnosis Date  . Ischemic bowel syndrome ([place]) 06/2010  . Irritable bowel   . C. difficile colitis 2011  . Allergic rhinitis     Followed by Dr. Donneta Romberg  . Esophageal reflux     Followed by GI physician, on Dexilant and Ranitidine  . Migraine   . 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 Osteoporosis 2009    on Reclast once yearly, Followed by Dr. Jefm Bryant  . Ovarian cancer ([place])   . COPD (chronic obstructive pulmonary disease) ([place])   . Thrombocytopenia ([place])   . MVP (mitral valve prolapse)   . SVT (supraventricular tachycardia) [facility])     Past Surgical History  Procedure Laterality Date  . Bil roken wrists    . Gallbladder removed    . Bilateral bunionectomies    . Cesarean section    . Abdominal hysterectomy    . Cholecystectomy    . Kyphoplasty N/A 05/31/2015    Procedure: KYPHOPLASTY L-2 and L-4;  Surgeon: Hessie Knows, MD;  Location: [facility] ORS;  Service: Orthopedics;  Laterality: N/A;    There were no vitals filed for this visit.  Visit Diagnosis:  Lymphedema of both lower extremities      Subjective Assessment - 08/13/15 1134    Subjective  Pt presents for OT treatment visit 4 to address BLE LE. Pt is accompanied by her husband, Amanda Robbins. Pt has missed several OT  appointments due to limitted pain control. Pt reports she feels wrapping the R leg at the last visit really made her feel better. We discussed difficulty participating in OT when pain is severe  and also agreed to communicate re burden of care at each visit..    Patient is accompained by: Family member   Pertinent History PRECAUTIONS: ACTIVE CANCER, CARDIAC , PULMONARY ( COPD, MVP,SVT, HX PULMONARY EMBOLISM) ,  Hx RLE cellulitis. Hx lumbar compression fx 2/2 fall and s/p recent kyphoplasty. Hx B wrist fx. Periferal neuropathy. osteoporosis. 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  S/P abdominal hhysterectomy and chemotheray,    Limitations difficulty walking, decreased balance, falls risk, needs varing levels of assistance and assitive devices to complete all sit to stand transfers, limited bed mobility, decreased AROM, decreased standing tolerance, decreased endurance and activity tolerance, postural limitations; needs assistance for all basic and instrumental ADLs. difficulty  sleeping   Special Tests Typical leiisure activities include reading, cooking, walking, biking   Patient Stated Goals reduce swelling so I can walk and do more. "Our anniversary is coming up and  we're going to [facility]. I would love to be able to ride a bike."   Currently in Pain? --  not numerically rated   Pain Location Back   Pain Type Intractable pain   Pain Onset More than a month ago                      OT Treatments/Exercises (OP) - 08/13/15 0001    ADLs   ADL Education Given Yes   Manual Therapy   Manual Therapy Edema management;Compression Bandaging   Edema Management Emphasis of OT on caregiver edu for proper application of gradient compression wraps  toes to groin   Compression Bandaging LLE wraps o nly today 2/2 signs /  symptoms RLE cellulitis. LLE gradient compression wraps applied circumferentially in gradient configuration from toes to groin as follows: toe wrap x1 under cotton stockinett; 8 cm x 1 to foot and ankle, 10 cm x 2, then  12 cm x 2- all layered over .04 x 10 cm and 12 cm Rosidol Soft foam from A-G.                OT Education - 08/13/15 1139    Education provided Yes   Education Details Emphasis of OT session today on Pt and caregiver edu for LE self care, including gradient compression wrapping to RLE.   Person(s) Educated Patient;Spouse   Methods Explanation;Demonstration;Tactile cues;Verbal cues;Handout   Comprehension Verbalized understanding;Returned demonstration;Verbal cues required;Tactile cues required;Need further instruction             OT Long Term Goals - 07/30/15 1228    OT LONG TERM GOAL #1   Title Lymphedema (LE) self-care: Pt able to correctly apply gradient compression wraps toes to groin with maximum assistance from caregiver within 2 weeks for optimal limb volume reduction.   Baseline dependent   Time 2   Period Weeks   Status New   OT LONG TERM GOAL #2   Title Pt to achieve at least 10% LLE limb volume reduction to limit LE progression, decrease infection risk, to reduce foot and leg pain, and to improve functional mobility and transfers.   Baseline dependent   Time 12   Period Weeks   Status New   OT LONG TERM GOAL #3   Title Lymphedema (LE) self-care : Pt >/= 85 % compliant with all daily LE self-care protocols, including simple self-manual lymphatic drainage (MLD), skin care, lymphatic pumping therex, and donning/ doffing progression garments with needed level of caregiver assistance to limit progression, infection risk and further functional decline.     Baseline dependent   Time 12   Period Weeks   Status New   OT LONG TERM GOAL #4   Title Pt to tolerate compression garments and devices in keeping w/ prescribed wear regime within 1 week of issue  date to retain clinical and functional gains and to limit progression.   Baseline dependent   Time 12   Period Weeks   Status New   OT LONG TERM GOAL #5   Title Pt to achieve swelling reduction in B feet and legs suicient to enable her to fit existing lace up tennis shoes to ride recumbent bike in clinic gym, in effort to safely siimulate riding a bike, by DC   Baseline dependent   Time 12   Period Weeks   Status New   Long Term Additional Goals   Additional Long Term Goals Yes   OT LONG TERM GOAL #6  Title During Management Phase CDT Pt to sustain limb volume reductions achieved during Intensive Phase CDT within 5% utilizing LE self-care protocols, appropriate compression garments/ devices, and needed level of caregiver assistance.   Baseline dependent   Time 6   Period Months   Status New               Plan - 08/13/15 1144    Clinical Impression Statement Pt returns tpaino OT today after missing several sessions. Greatest obstacle to participation in CDT at present is intractable pain in back and legs. Added oam overlay on Rx bed today in an effort to increase comfort in area of cocyx. Pt tolerated treatment better today than last session with padded surface. and  limited need for repositioning. After skilled edu spouse was able to apply gradient cmpression wraps to RLE using proper techniques and tension. Reviewed signs/ symptoms of infection. as Pt has a large blister on medial aspect of L heel which  burst over the weekend. Pt reports she just finished her antibiotic course for L foot cellulitis.   Pt will benefit from skilled therapeutic intervention in order to improve on the following deficits (Retired) Abnormal gait;Decreased endurance;Decreased skin integrity;Decreased knowledge of precautions;Improper body mechanics;Decreased activity tolerance;Decreased knowledge of use of DME;Decreased strength;Impaired flexibility;Improper spinal/pelvic alignment;Decreased  balance;Decreased mobility;Difficulty walking;Impaired sensation;Increased edema;Pain   Rehab Potential Good   Clinical Impairments Affecting Rehab Potential back pain, fchemo-related fatigue   OT Duration 12 weeks   OT Treatment/Interventions Self-care/ADL training;Energy conservation;Compression bandaging;DME and/or AE instruction;Patient/family education;Therapist, nutritional;Therapeutic exercise;Manual Therapy;Manual lymph drainage;Therapeutic activities   OT Home Exercise Plan LE pumping ther ex   Consulted and Agree with Plan of Care Patient;Family member/caregiver   Family Member Consulted husband, Amanda Robbins        Problem List Patient Active Problem List   Diagnosis Date Noted  . Chest wall pain 07/08/2015  . Malnutrition of moderate degree 05/31/2015  . Lumbar compression fracture ([place])   . Cellulitis of right lower extremity 05/29/2015  . Back pain 05/29/2015  . Macrocytic anemia 12/01/2014  . Ovarian cancer ([place]) 08/31/2014  . 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 Brain metastasis ([place]) 06/01/2014  . Dizzinesses 05/26/2013  . Medicare annual wellness visit, subsequent 11/22/2011  . GERD (gastroesophageal reflux disease) 08/22/2011  . Neuropathy ([place]) 05/24/2011  . Insomnia 05/24/2011  . Osteoporosis 05/24/2011  . IBS (irritable bowel syndrome) 05/24/2011  . Migraine 05/24/2011  . Thrombocytopenia ([place]) 05/24/2011  . Hyperlipidemia 05/24/2011   Andrey Spearman, MS, OTR/L, [facility] 08/13/2015 11:51 AM   [place] MAIN [facility] SERVICES [address] [place], [place], [address] Phone: [phone]   Fax:  [phone]  Name: Amanda Robbins MRN: [id number] Date of Birth: 05-08-1941

## 2015-08-16 ENCOUNTER — Telehealth: Payer: Self-pay | Admitting: *Deleted

## 2015-08-16 ENCOUNTER — Ambulatory Visit: Payer: Medicare Other | Admitting: Occupational Therapy

## 2015-08-16 NOTE — Telephone Encounter (Signed)
Per Dr Grayland Ormond want to see pt tomorrow or Wednesday

## 2015-08-16 NOTE — Telephone Encounter (Signed)
Agrees to appt at 10 AM tomorrow

## 2015-08-16 NOTE — Telephone Encounter (Signed)
MS too strong, Tramadol does not work and she went to see Dr Loney Hering who gave her Hydrocodone, but it is not working. She is having pain below her knees and in her ankles. Dr Loney Hering also put her on Prednisone, but she is still taking Dexamethasone. Sam said the only way to let her have MS is for her to be in the hospital and is asking if he should take her there for pain relief

## 2015-08-17 ENCOUNTER — Observation Stay
Admission: EM | Admit: 2015-08-17 | Discharge: 2015-08-19 | Disposition: A | Discharge status: Home / Self Care | Payer: Medicare Other | Attending: Internal Medicine | Admitting: Internal Medicine

## 2015-08-17 ENCOUNTER — Emergency Department: Payer: Medicare Other

## 2015-08-17 ENCOUNTER — Other Ambulatory Visit: Payer: Self-pay

## 2015-08-17 ENCOUNTER — Inpatient Hospital Stay: Payer: Medicare Other | Admitting: Oncology

## 2015-08-17 ENCOUNTER — Encounter: Payer: Self-pay | Admitting: Emergency Medicine

## 2015-08-17 DIAGNOSIS — Z86711 Personal history of pulmonary embolism: Secondary | ICD-10-CM | POA: Insufficient documentation

## 2015-08-17 DIAGNOSIS — Z79899 Other long term (current) drug therapy: Secondary | ICD-10-CM | POA: Insufficient documentation

## 2015-08-17 DIAGNOSIS — N390 Urinary tract infection, site not specified: Secondary | ICD-10-CM | POA: Diagnosis present

## 2015-08-17 DIAGNOSIS — R55 Syncope and collapse: Secondary | ICD-10-CM | POA: Diagnosis not present

## 2015-08-17 DIAGNOSIS — K589 Irritable bowel syndrome without diarrhea: Secondary | ICD-10-CM | POA: Insufficient documentation

## 2015-08-17 DIAGNOSIS — C7931 Secondary malignant neoplasm of brain: Secondary | ICD-10-CM | POA: Diagnosis not present

## 2015-08-17 DIAGNOSIS — Z885 Allergy status to narcotic agent status: Secondary | ICD-10-CM | POA: Insufficient documentation

## 2015-08-17 DIAGNOSIS — Z7952 Long term (current) use of systemic steroids: Secondary | ICD-10-CM | POA: Diagnosis not present

## 2015-08-17 DIAGNOSIS — M7989 Other specified soft tissue disorders: Secondary | ICD-10-CM | POA: Insufficient documentation

## 2015-08-17 DIAGNOSIS — Z888 Allergy status to other drugs, medicaments and biological substances status: Secondary | ICD-10-CM | POA: Insufficient documentation

## 2015-08-17 DIAGNOSIS — L97429 Non-pressure chronic ulcer of left heel and midfoot with unspecified severity: Secondary | ICD-10-CM | POA: Insufficient documentation

## 2015-08-17 DIAGNOSIS — Z87891 Personal history of nicotine dependence: Secondary | ICD-10-CM | POA: Insufficient documentation

## 2015-08-17 DIAGNOSIS — E876 Hypokalemia: Secondary | ICD-10-CM | POA: Diagnosis not present

## 2015-08-17 DIAGNOSIS — Z7901 Long term (current) use of anticoagulants: Secondary | ICD-10-CM | POA: Diagnosis not present

## 2015-08-17 DIAGNOSIS — R600 Localized edema: Secondary | ICD-10-CM | POA: Diagnosis not present

## 2015-08-17 DIAGNOSIS — K219 Gastro-esophageal reflux disease without esophagitis: Secondary | ICD-10-CM | POA: Diagnosis not present

## 2015-08-17 DIAGNOSIS — Z79891 Long term (current) use of opiate analgesic: Secondary | ICD-10-CM | POA: Diagnosis not present

## 2015-08-17 DIAGNOSIS — E860 Dehydration: Secondary | ICD-10-CM | POA: Insufficient documentation

## 2015-08-17 DIAGNOSIS — R778 Other specified abnormalities of plasma proteins: Secondary | ICD-10-CM

## 2015-08-17 DIAGNOSIS — I341 Nonrheumatic mitral (valve) prolapse: Secondary | ICD-10-CM | POA: Diagnosis not present

## 2015-08-17 DIAGNOSIS — R748 Abnormal levels of other serum enzymes: Secondary | ICD-10-CM | POA: Diagnosis not present

## 2015-08-17 DIAGNOSIS — Z8572 Personal history of non-Hodgkin lymphomas: Secondary | ICD-10-CM | POA: Insufficient documentation

## 2015-08-17 DIAGNOSIS — Z9889 Other specified postprocedural states: Secondary | ICD-10-CM | POA: Insufficient documentation

## 2015-08-17 DIAGNOSIS — Z90710 Acquired absence of both cervix and uterus: Secondary | ICD-10-CM | POA: Insufficient documentation

## 2015-08-17 DIAGNOSIS — Z8041 Family history of malignant neoplasm of ovary: Secondary | ICD-10-CM | POA: Insufficient documentation

## 2015-08-17 DIAGNOSIS — I471 Supraventricular tachycardia: Secondary | ICD-10-CM | POA: Insufficient documentation

## 2015-08-17 DIAGNOSIS — Z886 Allergy status to analgesic agent status: Secondary | ICD-10-CM | POA: Diagnosis not present

## 2015-08-17 DIAGNOSIS — C569 Malignant neoplasm of unspecified ovary: Secondary | ICD-10-CM | POA: Diagnosis not present

## 2015-08-17 DIAGNOSIS — R918 Other nonspecific abnormal finding of lung field: Secondary | ICD-10-CM | POA: Diagnosis not present

## 2015-08-17 DIAGNOSIS — Z9049 Acquired absence of other specified parts of digestive tract: Secondary | ICD-10-CM | POA: Diagnosis not present

## 2015-08-17 DIAGNOSIS — M810 Age-related osteoporosis without current pathological fracture: Secondary | ICD-10-CM | POA: Insufficient documentation

## 2015-08-17 DIAGNOSIS — D696 Thrombocytopenia, unspecified: Secondary | ICD-10-CM | POA: Diagnosis not present

## 2015-08-17 DIAGNOSIS — B370 Candidal stomatitis: Secondary | ICD-10-CM | POA: Diagnosis not present

## 2015-08-17 DIAGNOSIS — Z66 Do not resuscitate: Secondary | ICD-10-CM | POA: Diagnosis not present

## 2015-08-17 DIAGNOSIS — Z8673 Personal history of transient ischemic attack (TIA), and cerebral infarction without residual deficits: Secondary | ICD-10-CM | POA: Diagnosis not present

## 2015-08-17 DIAGNOSIS — G43909 Migraine, unspecified, not intractable, without status migrainosus: Secondary | ICD-10-CM | POA: Diagnosis not present

## 2015-08-17 DIAGNOSIS — J449 Chronic obstructive pulmonary disease, unspecified: Secondary | ICD-10-CM | POA: Insufficient documentation

## 2015-08-17 DIAGNOSIS — Z803 Family history of malignant neoplasm of breast: Secondary | ICD-10-CM | POA: Diagnosis not present

## 2015-08-17 DIAGNOSIS — R7989 Other specified abnormal findings of blood chemistry: Secondary | ICD-10-CM

## 2015-08-17 DIAGNOSIS — L8990 Pressure ulcer of unspecified site, unspecified stage: Secondary | ICD-10-CM | POA: Insufficient documentation

## 2015-08-17 DIAGNOSIS — Z85038 Personal history of other malignant neoplasm of large intestine: Secondary | ICD-10-CM | POA: Diagnosis not present

## 2015-08-17 LAB — CBC WITH DIFFERENTIAL/PLATELET
BASOS ABS: 0 10*3/uL (ref 0–0.1)
EOS ABS: 0 10*3/uL (ref 0–0.7)
Eosinophils Relative: 0 %
HCT: 39.3 % (ref 35.0–47.0)
HEMOGLOBIN: 13.5 g/dL (ref 12.0–16.0)
Lymphocytes Relative: 5 %
Lymphs Abs: 0.3 10*3/uL — ABNORMAL LOW (ref 1.0–3.6)
MCH: 34.7 pg — ABNORMAL HIGH (ref 26.0–34.0)
MCHC: 34.4 g/dL (ref 32.0–36.0)
MCV: 100.9 fL — ABNORMAL HIGH (ref 80.0–100.0)
Monocytes Absolute: 0.2 10*3/uL (ref 0.2–0.9)
NEUTROS ABS: 6.1 10*3/uL (ref 1.4–6.5)
Platelets: 119 10*3/uL — ABNORMAL LOW (ref 150–440)
RBC: 3.9 MIL/uL (ref 3.80–5.20)
RDW: 15.3 % — ABNORMAL HIGH (ref 11.5–14.5)
WBC: 6.6 10*3/uL (ref 3.6–11.0)

## 2015-08-17 LAB — URINALYSIS COMPLETE WITH MICROSCOPIC (ARMC ONLY)
Bilirubin Urine: NEGATIVE
Glucose, UA: NEGATIVE mg/dL
Hgb urine dipstick: NEGATIVE
LEUKOCYTES UA: NEGATIVE
NITRITE: POSITIVE — AB
PH: 6 (ref 5.0–8.0)
PROTEIN: NEGATIVE mg/dL
SPECIFIC GRAVITY, URINE: 1.015 (ref 1.005–1.030)
Squamous Epithelial / LPF: NONE SEEN

## 2015-08-17 LAB — COMPREHENSIVE METABOLIC PANEL
ALBUMIN: 2 g/dL — AB (ref 3.5–5.0)
ALK PHOS: 136 U/L — AB (ref 38–126)
ALT: 33 U/L (ref 14–54)
ANION GAP: 6 (ref 5–15)
AST: 30 U/L (ref 15–41)
BUN: 22 mg/dL — AB (ref 6–20)
CALCIUM: 7.6 mg/dL — AB (ref 8.9–10.3)
CO2: 24 mmol/L (ref 22–32)
Chloride: 106 mmol/L (ref 101–111)
Creatinine, Ser: 0.52 mg/dL (ref 0.44–1.00)
GFR calc Af Amer: >60 mL/min (ref >60)
GFR calc non Af Amer: >60 mL/min (ref >60)
GLUCOSE: 116 mg/dL — AB (ref 65–99)
Potassium: 3.2 mmol/L — ABNORMAL LOW (ref 3.5–5.1)
SODIUM: 136 mmol/L (ref 135–145)
Total Bilirubin: 1.1 mg/dL (ref 0.3–1.2)
Total Protein: 4.5 g/dL — ABNORMAL LOW (ref 6.5–8.1)

## 2015-08-17 LAB — TROPONIN I: Troponin I: 0.04 ng/mL — ABNORMAL HIGH (ref <0.031)

## 2015-08-17 MED ORDER — TRAMADOL HCL 50 MG PO TABS
50.0000 mg | ORAL_TABLET | Freq: Four times a day (QID) | ORAL | Status: DC | PRN
  Administered 2015-08-17: 22:00:00 50 mg via ORAL
  Administered 2015-08-18 (×2): 100 mg via ORAL
  Filled 2015-08-17: qty 2
  Filled 2015-08-17: qty 1
  Filled 2015-08-17: qty 2

## 2015-08-17 MED ORDER — APIXABAN 5 MG PO TABS
5.0000 mg | ORAL_TABLET | Freq: Two times a day (BID) | ORAL | Status: DC
  Administered 2015-08-17 – 2015-08-19 (×4): 5 mg via ORAL
  Filled 2015-08-17 (×4): qty 1

## 2015-08-17 MED ORDER — MORPHINE SULFATE 15 MG PO TABS
15.0000 mg | ORAL_TABLET | ORAL | Status: DC | PRN
  Administered 2015-08-17 – 2015-08-18 (×5): 15 mg via ORAL
  Filled 2015-08-17 (×5): qty 1

## 2015-08-17 MED ORDER — ZOLPIDEM TARTRATE 5 MG PO TABS
5.0000 mg | ORAL_TABLET | Freq: Every evening | ORAL | Status: DC | PRN
  Administered 2015-08-17: 5 mg via ORAL
  Filled 2015-08-17: qty 1

## 2015-08-17 MED ORDER — POTASSIUM CHLORIDE CRYS ER 20 MEQ PO TBCR
20.0000 meq | EXTENDED_RELEASE_TABLET | Freq: Two times a day (BID) | ORAL | Status: DC
  Administered 2015-08-17 – 2015-08-19 (×4): 20 meq via ORAL
  Filled 2015-08-17 (×4): qty 1

## 2015-08-17 MED ORDER — SODIUM CHLORIDE 0.9 % IV SOLN
INTRAVENOUS | Status: DC
  Administered 2015-08-17 – 2015-08-18 (×3): via INTRAVENOUS

## 2015-08-17 MED ORDER — PREDNISONE 10 MG PO TABS: 10.0000 mg | ORAL_TABLET | Freq: Every day | ORAL | Status: DC

## 2015-08-17 MED ORDER — ACETAMINOPHEN 500 MG PO TABS: 500.0000 mg | ORAL_TABLET | Freq: Four times a day (QID) | ORAL | Status: DC | PRN

## 2015-08-17 MED ORDER — SODIUM CHLORIDE 0.9 % IV SOLN
1000.0000 mL | Freq: Once | INTRAVENOUS | Status: AC
  Administered 2015-08-17: 1000 mL via INTRAVENOUS

## 2015-08-17 MED ORDER — FUROSEMIDE 20 MG PO TABS
20.0000 mg | ORAL_TABLET | Freq: Every day | ORAL | Status: DC
  Administered 2015-08-18 – 2015-08-19 (×2): 20 mg via ORAL
  Filled 2015-08-17 (×2): qty 1

## 2015-08-17 MED ORDER — RISAQUAD PO CAPS
1.0000 | ORAL_CAPSULE | Freq: Every day | ORAL | Status: DC
  Administered 2015-08-18 – 2015-08-19 (×2): 1 via ORAL
  Filled 2015-08-17 (×2): qty 1

## 2015-08-17 MED ORDER — DEXAMETHASONE 4 MG PO TABS
4.0000 mg | ORAL_TABLET | Freq: Every day | ORAL | Status: DC
  Administered 2015-08-18 – 2015-08-19 (×2): 4 mg via ORAL
  Filled 2015-08-17 (×2): qty 1

## 2015-08-17 MED ORDER — HYDROMORPHONE HCL 1 MG/ML IJ SOLN
INTRAMUSCULAR | Status: AC
  Administered 2015-08-17: 1 mg via INTRAVENOUS
  Filled 2015-08-17: qty 1

## 2015-08-17 MED ORDER — TOPIRAMATE 25 MG PO TABS
25.0000 mg | ORAL_TABLET | Freq: Every day | ORAL | Status: DC
  Administered 2015-08-17 – 2015-08-19 (×3): 25 mg via ORAL
  Filled 2015-08-17 (×4): qty 1

## 2015-08-17 MED ORDER — PHENAZOPYRIDINE HCL 200 MG PO TABS
200.0000 mg | ORAL_TABLET | Freq: Three times a day (TID) | ORAL | Status: DC | PRN
  Administered 2015-08-18: 200 mg via ORAL
  Filled 2015-08-17 (×3): qty 1

## 2015-08-17 MED ORDER — HYDROMORPHONE HCL 1 MG/ML IJ SOLN
1.0000 mg | Freq: Once | INTRAMUSCULAR | Status: AC
  Administered 2015-08-17: 1 mg via INTRAVENOUS

## 2015-08-17 MED ORDER — DIMENHYDRINATE 50 MG PO TABS
25.0000 mg | ORAL_TABLET | Freq: Three times a day (TID) | ORAL | Status: DC | PRN
Start: 2015-08-17 — End: 2015-08-19
  Filled 2015-08-17: qty 1

## 2015-08-17 MED ORDER — CALCIUM CARBONATE ANTACID 500 MG PO CHEW
500.0000 mg | CHEWABLE_TABLET | Freq: Two times a day (BID) | ORAL | Status: DC
  Administered 2015-08-17 – 2015-08-19 (×4): 500 mg via ORAL
  Filled 2015-08-17 (×4): qty 1

## 2015-08-17 MED ORDER — PANTOPRAZOLE SODIUM 40 MG PO TBEC
40.0000 mg | DELAYED_RELEASE_TABLET | Freq: Every day | ORAL | Status: DC
  Administered 2015-08-18 – 2015-08-19 (×2): 40 mg via ORAL
  Filled 2015-08-17 (×2): qty 1

## 2015-08-17 MED ORDER — CYCLOBENZAPRINE HCL 10 MG PO TABS: 5.0000 mg | ORAL_TABLET | Freq: Three times a day (TID) | ORAL | Status: DC | PRN

## 2015-08-17 NOTE — ED Provider Notes (Addendum)
[facility] Emergency Department Provider Note     Time seen: ----------------------------------------- 9:00 AM on 08/17/2015 -----------------------------------------    I have reviewed the triage vital signs and the nursing notes.   HISTORY  Chief Complaint Loss of Consciousness    HPI Amanda Robbins is a 75 y.o. female who presents to ER being brought by EMS from home after a syncopal event. Patient passed out from a standing position when she was going to the restroom. She was caught by her husband, she ordinarily does not walk very far. She can't walk at all today, complains of normal pain in her legs and in her sacrum due to immobility. Patient was noted to have red and swollen legs on arrival. Patient states she was posted talk to Dr. Grayland Ormond about worsening cancer markers morning. She denies fevers chills or other complaints. Patient reports she is on a liquid diet right now and has been taking in adequate amounts.   Past Medical History  Diagnosis Date  . Ischemic bowel syndrome ([place]) 06/2010  . Irritable bowel   . C. difficile colitis 2011  . Allergic rhinitis     Followed by Dr. Donneta Romberg  . Esophageal reflux     Followed by GI physician, on Dexilant and Ranitidine  . Migraine   . 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 Osteoporosis 2009    on Reclast once yearly, Followed by Dr. Jefm Bryant  . Ovarian cancer ([place])   . COPD (chronic obstructive pulmonary disease) ([place])   . Thrombocytopenia ([place])   . MVP (mitral valve prolapse)   . SVT (supraventricular tachycardia) [facility])     Patient Active Problem List   Diagnosis Date Noted  . Chest wall pain 07/08/2015  . Malnutrition of moderate degree 05/31/2015  . Lumbar compression fracture ([place])   . Cellulitis of right lower extremity 05/29/2015  . Back pain 05/29/2015  . Macrocytic anemia 12/01/2014  . Ovarian cancer ([place]) 08/31/2014  . Embolism, pulmonary with  infarction ([place]) 08/31/2014  . Brain metastasis ([place]) 06/01/2014  . Dizzinesses 05/26/2013  . Medicare annual wellness visit, subsequent 11/22/2011  . GERD (gastroesophageal reflux disease) 08/22/2011  . Neuropathy ([place]) 05/24/2011  . Insomnia 05/24/2011  . Osteoporosis 05/24/2011  . IBS (irritable bowel syndrome) 05/24/2011  . Migraine 05/24/2011  . Thrombocytopenia ([place]) 05/24/2011  . Hyperlipidemia 05/24/2011    Past Surgical History  Procedure Laterality Date  . Bil roken wrists    . Gallbladder removed    . Bilateral bunionectomies    . Cesarean section    . Abdominal hysterectomy    . Cholecystectomy    . Kyphoplasty N/A 05/31/2015    Procedure: KYPHOPLASTY L-2 and L-4;  Surgeon: Hessie Knows, MD;  Location: [facility] ORS;  Service: Orthopedics;  Laterality: N/A;    Allergies Aspirin; Codeine; Fosamax ; Ibuprofen; Ibuprofen; Metronidazole; Naproxen; Naproxen sodium; Nsaids; Oxycodone; and Oxycodone-acetaminophen  Social History Social History  Substance Use Topics  . Smoking status: Former Smoker    Quit date: 06/12/1989  . Smokeless tobacco: Never Used  . Alcohol Use: No    Review of Systems Constitutional: Negative for fever. Eyes: Negative for visual changes. ENT: Negative for sore throat. Cardiovascular: Negative for chest pain. Respiratory: Negative for shortness of breath. Gastrointestinal: Negative for abdominal pain, Positive for chronic diarrhea Genitourinary: Negative for dysuria. Musculoskeletal: Negative for back pain. Skin: Positive for leg erythema Neurological: Negative for headaches, Positive for weakness  10-point ROS otherwise negative.  ____________________________________________   PHYSICAL EXAM:  VITAL SIGNS: ED Triage Vitals  Enc Vitals Group     BP --      Pulse --      Resp --      Temp --      Temp src --      SpO2 --      Weight --      Height --      Head Cir --      Peak Flow --      Pain Score --      Pain Loc --       Pain Edu? --      Excl. in [place]? --     Constitutional: Alert and oriented. Cachexia Eyes: Scleral icterus, chemosis. PERRL. Normal extraocular movements. ENT   Head: Normocephalic and atraumatic.   Nose: No congestion/rhinnorhea.   Mouth/Throat: Mucous membranes are moist.   Neck: No stridor. Cardiovascular: Rapid rate, regular rhythm. Normal and symmetric distal pulses are present in all extremities. No murmurs, rubs, or gallops. Respiratory: Normal respiratory effort without tachypnea nor retractions. Breath sounds are clear and equal bilaterally. No wheezes/rales/rhonchi. Gastrointestinal: Soft and nontender. No distention. No abdominal bruits.  Musculoskeletal: No lower extremity tenderness, bilateral pitting edema to the knees with erythema Neurologic:  Normal speech and language. No gross focal neurologic deficits are appreciated.  Skin:  Markedly erythema below the knees bilaterally, pressure sore over the left foot Psychiatric: Depressed mood and affect ____________________________________________  EKG: Interpreted by me. Sinus tachycardia with a rate of 118 bpm, normal PR interval, normal QRS, normal QT interval. Normal axis.  ____________________________________________  ED COURSE:  Pertinent labs & imaging results that were available during my care of the patient were reviewed by me and considered in my medical decision making (see chart for details). Patient is in no acute distress, echo dehydrated. She'll receive IV fluids we will check basic labs. I will discuss with oncology. ____________________________________________    LABS (pertinent positives/negatives)  Labs Reviewed  CBC WITH DIFFERENTIAL/PLATELET - Abnormal; Notable for the following:    MCV 100.9 (*)    MCH 34.7 (*)    RDW 15.3 (*)    Platelets 119 (*)    Lymphs Abs 0.3 (*)    All other components within normal limits  COMPREHENSIVE METABOLIC PANEL - Abnormal; Notable for the following:     Potassium 3.2 (*)    Glucose, Bld 116 (*)    BUN 22 (*)    Calcium 7.6 (*)    Total Protein 4.5 (*)    Albumin 2.0 (*)    Alkaline Phosphatase 136 (*)    All other components within normal limits  TROPONIN I - Abnormal; Notable for the following:    Troponin I 0.04 (*)    All other components within normal limits  URINALYSIS COMPLETEWITH MICROSCOPIC ([facility] ONLY) - Abnormal; Notable for the following:    Color, Urine YELLOW (*)    APPearance HAZY (*)    Ketones, ur TRACE (*)    Nitrite POSITIVE (*)    Bacteria, UA MANY (*)    All other components within normal limits  ____________________________________________  FINAL ASSESSMENT AND PLAN  Syncope, dehydration, edema, elevated troponin  Plan: Patient with labs and imaging as dictated above. Patient with the above findings and possibly a UTI as well. I will send urine culture, I am hesitant to start antibiotics due to her history of C. difficile colitis. She has received IV fluids, is requiring  IV pain medicine for her legs. I will order a Doppler to evaluate for blood clots in her legs. She would benefit from hospitalization.   Earleen Newport, MD   Earleen Newport, MD 08/17/15 [place], MD 08/17/15 1030

## 2015-08-17 NOTE — Progress Notes (Signed)
Chaplain rounded the unit and provided a compassionate presence and support to the patient and family.  Chaplain Cormick Moss [phone] 

## 2015-08-17 NOTE — ED Notes (Signed)
In and out cath performed per pt request. Used 1F catheter. 550 cc output

## 2015-08-17 NOTE — ED Notes (Signed)
Admitting physician at bedside

## 2015-08-17 NOTE — ED Notes (Signed)
Pt via ems from home; passed out when standing up to go to restroom; caught by her husband. Pt c/o normal pain to her legs and rear end. Pt's legs very red and swollen.

## 2015-08-17 NOTE — Care Management Obs Status (Signed)
[facility] NOTIFICATION   Patient Details  Name: ABEGAYLE CURENTON MRN: [id number] Date of Birth: Jun 28, 1940   Medicare Observation Status Notification Given:  Yes    Beau Fanny, RN 08/17/2015, 12:11 PM

## 2015-08-17 NOTE — H&P (Signed)
[place] [facility] [place] NAME: Amanda Robbins    MR#:  [id number]  DATE OF BIRTH:  [birth year]  DATE OF ADMISSION:  08/17/2015  PRIMARY CARE PHYSICIAN: Rica Mast, MD   REQUESTING/REFERRING PHYSICIAN: Gwyndolyn Saxon  CHIEF COMPLAINT:   Chief Complaint  Patient presents with  . Loss of Consciousness    HISTORY OF PRESENT ILLNESS: Amanda Robbins  is a 75 y.o. female with a known history of , irritable bowel, C. difficile colitis, esophageal reflux, migraine, human thrombocytopenia, osteoporosis, COPD, colon cancer with metastasis, pulmonary embolism ( Feb 2016) , undergoing chemotherapy and radiation for ovarian cancer which is metastasized to her brain. She had PET scan and MRI of the brain done 3 days ago- and today she was supposed to follow with Dr. Gary Fleet office to discuss the results of that and further plan. In the morning while husband was helping her to get up and go to the bathroom she felt dizzy and he has to drop her and make her sit in the chair where she was unconscious for 3-4 minutes. Patient denies any episodes of chest pain or shortness of breath or palpitation before the episode or after the episode. As per family when they called EMS and they arrived they found her having irregular heartbeats. In ER she was noted to be very frail, and weak. So given for admission for possible dehydration.  PAST MEDICAL HISTORY:   Past Medical History  Diagnosis Date  . Ischemic bowel syndrome ([place]) 06/2010  . Irritable bowel   . C. difficile colitis 2011  . Allergic rhinitis     Followed by Dr. Donneta Romberg  . Esophageal reflux     Followed by GI physician, on Dexilant and Ranitidine  . Migraine   . 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 Osteoporosis 2009    on Reclast once yearly, Followed by Dr. Jefm Bryant  . COPD (chronic obstructive pulmonary disease) ([place])   . Thrombocytopenia ([place])   . MVP (mitral  valve prolapse)   . SVT (supraventricular tachycardia) ([place])   . Ovarian cancer ([place])     metastasis to brain    PAST SURGICAL HISTORY: Past Surgical History  Procedure Laterality Date  . Bil roken wrists    . Gallbladder removed    . Bilateral bunionectomies    . Cesarean section    . Abdominal hysterectomy    . Cholecystectomy    . Kyphoplasty N/A 05/31/2015    Procedure: KYPHOPLASTY L-2 and L-4;  Surgeon: Hessie Knows, MD;  Location: [facility] ORS;  Service: Orthopedics;  Laterality: N/A;    SOCIAL HISTORY:  Social History  Substance Use Topics  . Smoking status: Former Smoker    Quit date: 06/12/1989  . Smokeless tobacco: Never Used  . Alcohol Use: No    FAMILY HISTORY:  Family History  Problem Relation Age of Onset  . Ovarian cancer Maternal Grandmother 52  . Breast cancer Maternal Aunt 70    DRUG ALLERGIES:  Allergies  Allergen Reactions  . Aspirin Other (See Comments)    Other reaction(s): Localized superficial swelling of skin Uncoated asa-causes throat to swell up  . Codeine Itching    Other reaction(s): Itching of Skin  . Fosamax  [Alendronate Sodium]     Other reaction(s): Distress (finding)  . Ibuprofen     Other reaction(s): Distress (finding)  . Metronidazole Nausea And Vomiting  . Naproxen     Other reaction(s): Distress (  finding)  . Naproxen Sodium   . Nsaids     GI UPSET / GERD  . Oxycodone Other (See Comments)    Other reaction(s): Diarrhea and vomiting (finding) Patient can tolerate some doses.  . Oxycodone-Acetaminophen Other (See Comments)    Other reaction(s): Diarrhea and vomiting (finding) Patient can tolerate some doses.    REVIEW OF SYSTEMS:   CONSTITUTIONAL: No fever, positive for fatigue or weakness.  EYES: No blurred or double vision.  EARS, NOSE, AND THROAT: No tinnitus or ear pain.  RESPIRATORY: No cough, shortness of breath, wheezing or hemoptysis.  CARDIOVASCULAR: No chest pain, orthopnea, edema.  GASTROINTESTINAL: No  nausea, vomiting, diarrhea or abdominal pain.  GENITOURINARY: No dysuria, hematuria.  ENDOCRINE: No polyuria, nocturia,  HEMATOLOGY: No anemia, easy bruising or bleeding SKIN: No rash or lesion. MUSCULOSKELETAL: No joint pain or arthritis.   NEUROLOGIC: No tingling, numbness, weakness.  PSYCHIATRY: No anxiety or depression.   MEDICATIONS AT HOME:  Prior to Admission medications   Medication Sig Start Date End Date Taking? Authorizing Provider  acetaminophen (TYLENOL) 500 MG tablet Take 1 tablet (500 mg total) by mouth every 6 (six) hours as needed for moderate pain. 07/03/15 07/02/16 Yes Joanne Gavel, MD  Calcium Carbonate (CALCARB 600 PO) Take 600 mg by mouth 2 (two) times daily.   Yes Historical Provider, MD  denosumab (PROLIA) 60 MG/ML SOLN injection Inject 60 mg into the skin every 6 (six) months.    Yes Historical Provider, MD  dexamethasone (DECADRON) 4 MG tablet Take 1 tablet (4 mg total) by mouth daily. 04/13/15  Yes Lloyd Huger, MD  DimenhyDRINATE (DRAMAMINE PO) Take 1 tablet by mouth daily as needed (motion sickness.).    Yes Historical Provider, MD  ELIQUIS 5 MG TABS tablet take 1 tablet by mouth twice a day 08/02/15  Yes Evlyn Kanner, NP  furosemide (LASIX) 20 MG tablet take 1 tablet by mouth once daily 07/12/15  Yes Jackolyn Confer, MD  morphine (MSIR) 15 MG tablet Take 1 tablet (15 mg total) by mouth every 4 (four) hours as needed for severe pain. 08/09/15  Yes Lloyd Huger, MD  omeprazole (PRILOSEC) 20 MG capsule take 1 capsule by mouth twice a day 07/18/15  Yes Lloyd Huger, MD  potassium chloride SA (K-DUR,KLOR-CON) 20 MEQ tablet take 1 tablet by mouth twice a day 06/28/15  Yes Evlyn Kanner, NP  predniSONE (DELTASONE) 10 MG tablet Take 10 mg by mouth daily.  08/09/15  Yes Historical Provider, MD  Probiotic Product (PRO-BIOTIC BLEND) CAPS Take 1 capsule by mouth daily.   Yes Historical Provider, MD  topiramate (TOPAMAX) 25 MG tablet TAKE 1-2 TABLETS BY  MOUTH DAILY Patient taking differently: TAKE 1 TABLETS BY MOUTH DAILY 03/02/15  Yes Jackolyn Confer, MD  traMADol (ULTRAM) 50 MG tablet Take 1-2 tablets (50-100 mg total) by mouth 3 (three) times daily as needed. 07/08/15 07/07/16 Yes Jackolyn Confer, MD  cyclobenzaprine (FLEXERIL) 5 MG tablet Take 1 tablet (5 mg total) by mouth 3 (three) times daily as needed for muscle spasms. Patient not taking: Reported on 08/17/2015 06/02/15   Henreitta Leber, MD  Diphenhyd-Hydrocort-Nystatin (FIRST-DUKES MOUTHWASH) SUSP Use as directed 5 mLs in the mouth or throat 4 (four) times daily as needed. Patient not taking: Reported on 08/17/2015 03/25/15   Lloyd Huger, MD  lidocaine (LIDODERM) 5 % Place 1 patch onto the skin daily. Remove & Discard patch within 12 hours or as directed by MD  Patient not taking: Reported on 08/17/2015 07/08/15   Jackolyn Confer, MD  phenazopyridine (PYRIDIUM) 200 MG tablet Take 1 tablet (200 mg total) by mouth 3 (three) times daily as needed for pain. Patient not taking: Reported on 08/17/2015 04/05/15   Lloyd Huger, MD  zolpidem (AMBIEN) 5 MG tablet as directed take 1/2 to 1 tablet by mouth at bedtime if needed for sleep Patient not taking: Reported on 08/17/2015 06/15/15   Lloyd Huger, MD      PHYSICAL EXAMINATION:   VITAL SIGNS: Blood pressure 109/70, pulse 112, temperature 97.8 F (36.6 C), temperature source Oral, height 5\' 6"  (1.676 m), weight 62.6 kg (138 lb 0.1 oz), SpO2 96 %.  GENERAL:  75 y.o.-year-old thin patient lying in the bed with no acute distress.  EYES: Pupils equal, round, reactive to light and accommodation. No scleral icterus. Extraocular muscles intact.  HEENT: Head atraumatic, normocephalic. Oropharynx and nasopharynx clear.  NECK:  Supple, no jugular venous distention. No thyroid enlargement, no tenderness.  LUNGS: Normal breath sounds bilaterally, no wheezing, rales,rhonchi or crepitation. No use of accessory muscles of respiration.   CARDIOVASCULAR: S1, S2 normal, tachycardia. No murmurs, rubs, or gallops.  ABDOMEN: Soft, nontender, nondistended. Bowel sounds present. No organomegaly or mass.  EXTREMITIES: No pedal edema, cyanosis, or clubbing. On left foot she has "busted vesicle" leaving no subcutaneous tissue. NEUROLOGIC: Cranial nerves II through XII are intact. Muscle strength 3-4/5 in all extremities. Sensation intact. Gait not checked.  PSYCHIATRIC: The patient is alert and oriented x 3.  SKIN: No obvious rash, lesion, or ulcer.   LABORATORY PANEL:   CBC  Recent Labs Lab 08/17/15 0928  WBC 6.6  HGB 13.5  HCT 39.3  PLT 119*  MCV 100.9*  MCH 34.7*  MCHC 34.4  RDW 15.3*  LYMPHSABS 0.3*  MONOABS 0.2  EOSABS 0.0  BASOSABS 0.0   ------------------------------------------------------------------------------------------------------------------  Chemistries   Recent Labs Lab 08/17/15 0928  NA 136  K 3.2*  CL 106  CO2 24  GLUCOSE 116*  BUN 22*  CREATININE 0.52  CALCIUM 7.6*  AST 30  ALT 33  ALKPHOS 136*  BILITOT 1.1   ------------------------------------------------------------------------------------------------------------------ estimated creatinine clearance is 58.6 mL/min (by C-G formula based on Cr of 0.52). ------------------------------------------------------------------------------------------------------------------ No results for input(s): TSH, T4TOTAL, T3FREE, THYROIDAB in the last 72 hours.  Invalid input(s): FREET3   Coagulation profile No results for input(s): INR, PROTIME in the last 168 hours. ------------------------------------------------------------------------------------------------------------------- No results for input(s): DDIMER in the last 72 hours. -------------------------------------------------------------------------------------------------------------------  Cardiac Enzymes  Recent Labs Lab 08/17/15 0928  TROPONINI 0.04*    ------------------------------------------------------------------------------------------------------------------ Invalid input(s): POCBNP  ---------------------------------------------------------------------------------------------------------------  Urinalysis    Component Value Date/Time   COLORURINE YELLOW* 08/17/2015 0928   COLORURINE Yellow 06/29/2014 1521   APPEARANCEUR HAZY* 08/17/2015 0928   APPEARANCEUR Clear 06/29/2014 1521   LABSPEC 1.015 08/17/2015 0928   LABSPEC 1.017 06/29/2014 1521   PHURINE 6.0 08/17/2015 0928   PHURINE 5.0 06/29/2014 1521   GLUCOSEU NEGATIVE 08/17/2015 0928   GLUCOSEU Negative 06/29/2014 1521   HGBUR NEGATIVE 08/17/2015 0928   HGBUR Negative 06/29/2014 1521   BILIRUBINUR NEGATIVE 08/17/2015 0928   BILIRUBINUR Negative 06/29/2014 1521   BILIRUBINUR neg 10/17/2012 1353   KETONESUR TRACE* 08/17/2015 0928   KETONESUR Negative 06/29/2014 1521   PROTEINUR NEGATIVE 08/17/2015 0928   PROTEINUR Negative 06/29/2014 1521   PROTEINUR neg 10/17/2012 1353   UROBILINOGEN 0.2 10/17/2012 1353   NITRITE POSITIVE* 08/17/2015 0928   NITRITE Negative 06/29/2014 1521  NITRITE neg 10/17/2012 1353   LEUKOCYTESUR NEGATIVE 08/17/2015 0928   LEUKOCYTESUR Negative 06/29/2014 1521     RADIOLOGY: US Venous Img Lower Bilateral  08/17/2015  CLINICAL DATA:  Bilateral lower extremity pain and edema. 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 History of varicose veins. Evaluate for DVT. EXAM: BILATERAL LOWER EXTREMITY VENOUS DOPPLER ULTRASOUND TECHNIQUE: Gray-scale sonography with graded compression, as well as color Doppler and duplex ultrasound were performed to evaluate the lower extremity deep venous systems from the level of the common femoral vein and including the common femoral, femoral, profunda femoral, popliteal and calf veins including the posterior tibial, peroneal and gastrocnemius veins when visible. The superficial great  saphenous vein was also interrogated. Spectral Doppler was utilized to evaluate flow at rest and with distal augmentation maneuvers in the common femoral, femoral and popliteal veins. COMPARISON:  Bilateral lower extremity venous Doppler ultrasound - 07/21/2014 FINDINGS: RIGHT LOWER EXTREMITY Common Femoral Vein: No evidence of thrombus. Normal compressibility, respiratory phasicity and response to augmentation. Saphenofemoral Junction: No evidence of thrombus. Normal compressibility and flow on color Doppler imaging. Profunda Femoral Vein: No evidence of thrombus. Normal compressibility and flow on color Doppler imaging. Femoral Vein: No evidence of thrombus. Normal compressibility, respiratory phasicity and response to augmentation. Popliteal Vein: No evidence of thrombus. Normal compressibility, respiratory phasicity and response to augmentation. Calf Veins: No evidence of thrombus. Normal compressibility and flow on color Doppler imaging. Superficial Great Saphenous Vein: No evidence of thrombus. Normal compressibility and flow on color Doppler imaging. Venous Reflux:  None. Other Findings:  None. LEFT LOWER EXTREMITY Common Femoral Vein: No evidence of thrombus. Normal compressibility, respiratory phasicity and response to augmentation. Saphenofemoral Junction: No evidence of thrombus. Normal compressibility and flow on color Doppler imaging. Profunda Femoral Vein: No evidence of thrombus. Normal compressibility and flow on color Doppler imaging. Femoral Vein: No evidence of thrombus. Normal compressibility, respiratory phasicity and response to augmentation. Popliteal Vein: No evidence of thrombus. Normal compressibility, respiratory phasicity and response to augmentation. Calf Veins: No evidence of thrombus. Normal compressibility and flow on color Doppler imaging. Superficial Great Saphenous Vein: No evidence of thrombus. Normal compressibility and flow on color Doppler imaging. Venous Reflux:  None. Other  Findings:  None. IMPRESSION: No evidence of DVT within either lower extremity. Electronically Signed   By: Sandi Mariscal M.D.   On: 08/17/2015 11:37    EKG: Orders placed or performed during the hospital encounter of 08/17/15  . ED EKG  . ED EKG    IMPRESSION AND PLAN: * Syncopal episode   Most likely dehydration.   Give some IV fluid, and do cardiac monitoring.   She has metastatic cancer and on pain medication, that might be also playing a role here.  * ovarian cancer with metastasis to the brain   Dr. Grayland Ormond to discuss the further plan with family.   She is on chronic steroid and continue that.  * Tachycardia   Likely due to anxiety after hearing the report of her spreading cancer.   Monitor.  * History of pulmonary embolism in February 2016   Continue liquids.  * Hypokalemia   Until supplementation.  All the records are reviewed and case discussed with ED provider. Management plans discussed with the patient, family and they are in agreement.  CODE STATUS: Full code  Full code Code Status History    Date Active Date Inactive Code Status Order ID Comments User Context   05/31/2015  8:21 PM 06/02/2015  5:23  PM Full Code [id number]  Hessie Knows, MD Inpatient   05/29/2015  6:08 PM 05/31/2015  8:21 PM Full Code [id number]  Juluis Mire, MD ED     Discussed with patient's husband, son, daughter-in-law who is present in the room at the time of admission.  TOTAL TIME TAKING CARE OF THIS PATIENT: 50  minutes.    Vaughan Basta M.D on 08/17/2015   Between 7am to 6pm - Pager - [phone]  After 6pm go to [email or web address] - password [email or web address] [facility]  Office  [phone]  CC: Primary care physician; Rica Mast, MD   Note: This dictation was prepared with Dragon dictation along with smaller phrase technology. Any transcriptional errors that result from this process are unintentional.

## 2015-08-18 ENCOUNTER — Inpatient Hospital Stay: Payer: Medicare Other

## 2015-08-18 ENCOUNTER — Inpatient Hospital Stay: Payer: Medicare Other | Admitting: Oncology

## 2015-08-18 ENCOUNTER — Encounter: Payer: Medicare Other | Admitting: Occupational Therapy

## 2015-08-18 DIAGNOSIS — R5383 Other fatigue: Secondary | ICD-10-CM

## 2015-08-18 DIAGNOSIS — R110 Nausea: Secondary | ICD-10-CM

## 2015-08-18 DIAGNOSIS — R55 Syncope and collapse: Secondary | ICD-10-CM

## 2015-08-18 DIAGNOSIS — Z9221 Personal history of antineoplastic chemotherapy: Secondary | ICD-10-CM

## 2015-08-18 DIAGNOSIS — C7931 Secondary malignant neoplasm of brain: Secondary | ICD-10-CM | POA: Diagnosis not present

## 2015-08-18 DIAGNOSIS — R531 Weakness: Secondary | ICD-10-CM

## 2015-08-18 DIAGNOSIS — E860 Dehydration: Secondary | ICD-10-CM | POA: Diagnosis not present

## 2015-08-18 DIAGNOSIS — D696 Thrombocytopenia, unspecified: Secondary | ICD-10-CM

## 2015-08-18 DIAGNOSIS — C569 Malignant neoplasm of unspecified ovary: Secondary | ICD-10-CM | POA: Diagnosis not present

## 2015-08-18 DIAGNOSIS — R634 Abnormal weight loss: Secondary | ICD-10-CM

## 2015-08-18 DIAGNOSIS — Z87891 Personal history of nicotine dependence: Secondary | ICD-10-CM

## 2015-08-18 DIAGNOSIS — R630 Anorexia: Secondary | ICD-10-CM

## 2015-08-18 LAB — BASIC METABOLIC PANEL
Anion gap: 5 (ref 5–15)
BUN: 22 mg/dL — ABNORMAL HIGH (ref 6–20)
CALCIUM: 7.1 mg/dL — AB (ref 8.9–10.3)
CO2: 24 mmol/L (ref 22–32)
CREATININE: 0.6 mg/dL (ref 0.44–1.00)
Chloride: 107 mmol/L (ref 101–111)
GFR calc non Af Amer: >60 mL/min (ref >60)
Glucose, Bld: 92 mg/dL (ref 65–99)
Potassium: 3.1 mmol/L — ABNORMAL LOW (ref 3.5–5.1)
SODIUM: 136 mmol/L (ref 135–145)

## 2015-08-18 LAB — CBC
HCT: 37.2 % (ref 35.0–47.0)
Hemoglobin: 13.2 g/dL (ref 12.0–16.0)
MCH: 35.5 pg — ABNORMAL HIGH (ref 26.0–34.0)
MCHC: 35.5 g/dL (ref 32.0–36.0)
MCV: 100 fL (ref 80.0–100.0)
PLATELETS: 107 10*3/uL — AB (ref 150–440)
RBC: 3.72 MIL/uL — AB (ref 3.80–5.20)
RDW: 15.1 % — ABNORMAL HIGH (ref 11.5–14.5)
WBC: 5.9 10*3/uL (ref 3.6–11.0)

## 2015-08-18 MED ORDER — MORPHINE SULFATE (CONCENTRATE) 10 MG/0.5ML PO SOLN
15.0000 mg | ORAL | Status: DC | PRN
  Administered 2015-08-18 – 2015-08-19 (×2): 15 mg via ORAL
  Filled 2015-08-18 (×2): qty 1

## 2015-08-18 MED ORDER — FLUCONAZOLE IN SODIUM CHLORIDE 200-0.9 MG/100ML-% IV SOLN
200.0000 mg | Freq: Once | INTRAVENOUS | Status: AC
  Administered 2015-08-18: 200 mg via INTRAVENOUS
  Filled 2015-08-18: qty 100

## 2015-08-18 MED ORDER — POTASSIUM CHLORIDE 20 MEQ PO PACK
40.0000 meq | PACK | Freq: Once | ORAL | Status: AC
  Administered 2015-08-18: 13:00:00 40 meq via ORAL
  Filled 2015-08-18: qty 2

## 2015-08-18 MED ORDER — NYSTATIN 100000 UNIT/ML MT SUSP
5.0000 mL | Freq: Four times a day (QID) | OROMUCOSAL | Status: DC
  Administered 2015-08-18 – 2015-08-19 (×4): 500000 [IU] via ORAL
  Filled 2015-08-18 (×4): qty 5

## 2015-08-18 MED ORDER — SENNOSIDES-DOCUSATE SODIUM 8.6-50 MG PO TABS
1.0000 | ORAL_TABLET | Freq: Two times a day (BID) | ORAL | Status: DC
Start: 2015-08-18 — End: 2015-08-19
  Administered 2015-08-18 – 2015-08-19 (×3): 1 via ORAL
  Filled 2015-08-18 (×3): qty 1

## 2015-08-18 MED ORDER — DEXTROSE 5 % IV SOLN
1.0000 g | INTRAVENOUS | Status: DC
  Administered 2015-08-18: 1 g via INTRAVENOUS
  Filled 2015-08-18 (×2): qty 10

## 2015-08-18 MED ORDER — MAGNESIUM SULFATE 2 GM/50ML IV SOLN
2.0000 g | Freq: Once | INTRAVENOUS | Status: AC
  Administered 2015-08-18: 2 g via INTRAVENOUS
  Filled 2015-08-18: qty 50

## 2015-08-18 NOTE — Progress Notes (Signed)
New referral for [facility] services at home following discharge received from [facility]. 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 She has been treated for dehydration and hypokalemia. She has continued with a decline in her functional status and now is requesting hospice services. Writer met in the patient's room with her husband Sam daughter Elvina, son Jenny Reichmann and daughter in law to initiate education regarding hopsice services, philosophy and team approach to care with good understanding voiced by all. Questions answered. Mrs. Kiernan was alert off and on during the visit. Mr. Rotenberry has requested that medications at discharge be minimized and pain medication be changed to liquid. An indwelling foley has also been requested and patient has agreed to placement. She has been having urinary retention, is bed bound and has several wounds. A hospital bed has also been requested for delivery today with planned discharge via EMS tomorrow. Attending Dr. Leslye Peer notified, CMRN Hassan Rowan and staff RN notified as well. Patient information faxed to referral. Patient's daughter Amy expressed interest in [facility] for her children. [facility] [employer] fast streamer made aware. Thank you for the opportunity to be involved in the care of this patient.  Flo Shanks RN, BSN, [place] [facility] [place], [facility] [phone]

## 2015-08-18 NOTE — Consult Note (Signed)
[place]  [phone] Fax:([phone]) [phone]  ID: RUTHENE WOODHULL OB: 02-06-1941  MR#: [id number]  [id number]  Patient Care Team: Jackolyn Confer, MD as PCP - General (Internal Medicine)  CHIEF COMPLAINT:  Chief Complaint  Patient presents with  . Loss of Consciousness    INTERVAL HISTORY: Patient is a 75 year old female with known stage IV ovarian cancer and recent MRI showing progressive brain metastasis. She had a recent syncopal episode with loss of consciousness and was admitted for dehydration. Upon evaluation earlier today patient was alert and oriented. Her performance status is steadily declined over the past month and now that the point where she is at bound. She does not complain of pain currently. She does not complain of dizziness or other neurologic complaints. She has no fevers. She denies any chest pain or shortness of breath. She has increased nausea and poor oral intake. She denies any constipation or diarrhea. Patient offers no further specific complaints.  REVIEW OF SYSTEMS:   Review of Systems  Constitutional: Positive for weight loss and malaise/fatigue. Negative for fever.  Respiratory: Positive for shortness of breath.   Cardiovascular: Negative.  Negative for chest pain.  Gastrointestinal: Positive for nausea. Negative for abdominal pain, diarrhea and constipation.  Genitourinary: Negative.   Musculoskeletal: Positive for falls.  Neurological: Positive for weakness.  Psychiatric/Behavioral: Negative.     As per HPI. Otherwise, a complete review of systems is negatve.  PAST MEDICAL HISTORY: Past Medical History  Diagnosis Date  . 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 Irritable bowel   . C. difficile colitis 2011  . Allergic rhinitis     Followed by Dr. Donneta Romberg  . Esophageal reflux     Followed by GI physician, on Dexilant and Ranitidine  . Migraine   . 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 Osteoporosis 2009    on Reclast once yearly, Followed by Dr. Jefm Bryant  . COPD (chronic obstructive pulmonary disease) ([place])   . Thrombocytopenia ([place])   . MVP (mitral valve prolapse)   . SVT (supraventricular tachycardia) ([place])   . Ovarian cancer ([place])     metastasis to brain    PAST SURGICAL HISTORY: Past Surgical History  Procedure Laterality Date  . Bil roken wrists    . Gallbladder removed    . Bilateral bunionectomies    . Cesarean section    . Abdominal hysterectomy    . Cholecystectomy    . Kyphoplasty N/A 05/31/2015    Procedure: KYPHOPLASTY L-2 and L-4;  Surgeon: Hessie Knows, MD;  Location: [facility] ORS;  Service: Orthopedics;  Laterality: N/A;    FAMILY HISTORY Family History  Problem Relation Age of Onset  . Ovarian cancer Maternal Grandmother 9  . Breast cancer Maternal Aunt 80       ADVANCED DIRECTIVES:    HEALTH MAINTENANCE: Social History  Substance Use Topics  . Smoking status: Former Smoker    Quit date: 06/12/1989  . Smokeless tobacco: Never Used  . Alcohol Use: No     Colonoscopy:  PAP:  Bone density:  Lipid panel:  Allergies  Allergen Reactions  . Aspirin Other (See Comments)    Other reaction(s): Localized superficial swelling of skin Uncoated asa-causes throat to swell up  . Codeine Itching    Other reaction(s): Itching of Skin  . Fosamax  [Alendronate Sodium]     Other reaction(s): Distress (finding)  . Ibuprofen     Other reaction(s): Distress (finding)  .  Metronidazole Nausea And Vomiting  . Naproxen     Other reaction(s): Distress (finding)  . Naproxen Sodium   . Nsaids     GI UPSET / GERD  . Oxycodone Other (See Comments)    Other reaction(s): Diarrhea and vomiting (finding) Patient can tolerate some doses.  . Oxycodone-Acetaminophen Other (See Comments)    Other reaction(s): Diarrhea and vomiting (finding) Patient can tolerate some doses.    Current Facility-Administered Medications  Medication Dose  Route Frequency Provider Last Rate Last Dose  . 0.9 %  sodium chloride infusion   Intravenous Continuous Vaughan Basta, MD 75 mL/hr at 08/18/15 0407    . acetaminophen (TYLENOL) tablet 500 mg  500 mg Oral Q6H PRN Vaughan Basta, MD      . acidophilus (RISAQUAD) capsule 1 capsule  1 capsule Oral Daily Vaughan Basta, MD   1 capsule at 08/18/15 0958  . apixaban (ELIQUIS) tablet 5 mg  5 mg Oral BID Vaughan Basta, MD   5 mg at 08/18/15 0958  . calcium carbonate (TUMS - dosed in mg elemental calcium) chewable tablet 500 mg  500 mg Oral BID Vaughan Basta, MD   500 mg at 08/18/15 0958  . cyclobenzaprine (FLEXERIL) tablet 5 mg  5 mg Oral TID PRN Vaughan Basta, MD      . dexamethasone (DECADRON) tablet 4 mg  4 mg Oral Daily Vaughan Basta, MD   4 mg at 08/18/15 0958  . dimenhyDRINATE (DRAMAMINE) tablet 25 mg  25 mg Oral Q8H PRN Vaughan Basta, MD      . furosemide (LASIX) tablet 20 mg  20 mg Oral Daily Vaughan Basta, MD   20 mg at 08/18/15 0958  . morphine CONCENTRATE 10 MG/0.5ML oral solution 15 mg  15 mg Oral Q1H PRN Loletha Grayer, MD      . nystatin (MYCOSTATIN) 100000 UNIT/ML suspension 500,000 Units  5 mL Oral QID Loletha Grayer, MD   500,000 Units at 08/18/15 1331  . pantoprazole (PROTONIX) EC tablet 40 mg  40 mg Oral Daily Vaughan Basta, MD   40 mg at 08/18/15 0958  . phenazopyridine (PYRIDIUM) tablet 200 mg  200 mg Oral TID PRN Vaughan Basta, MD   200 mg at 08/18/15 0344  . potassium chloride SA (K-DUR,KLOR-CON) CR tablet 20 mEq  20 mEq Oral BID Vaughan Basta, MD   20 mEq at 08/18/15 0958  . senna-docusate (Senokot-S) tablet 1 tablet  1 tablet Oral BID Loletha Grayer, MD   1 tablet at 08/18/15 1520  . topiramate (TOPAMAX) tablet 25 mg  25 mg Oral Daily Vaughan Basta, MD   25 mg at 08/18/15 0958  . traMADol (ULTRAM) tablet 50-100 mg  50-100 mg Oral Q6H PRN Vaughan Basta, MD   100 mg at  08/18/15 0344  . zolpidem (AMBIEN) tablet 5 mg  5 mg Oral QHS PRN Vaughan Basta, MD   5 mg at 08/17/15 2137    OBJECTIVE: Filed Vitals:   08/17/15 2048 08/18/15 0439  BP: 103/58 114/57  Pulse: 110 121  Temp: 98.4 F (36.9 C) 98.2 F (36.8 C)  Resp: 18 18     Body mass index is 22.29 kg/(m^2).    ECOG FS:4 - Bedbound  General: Ill-appearing, no acute distress. Eyes: Pink conjunctiva, anicteric sclera. Lungs: Clear to auscultation bilaterally. Heart: Regular rate and rhythm. No rubs, murmurs, or gallops. Abdomen: Soft, nontender, nondistended. No organomegaly noted, normoactive bowel sounds. Musculoskeletal: No edema, cyanosis, or clubbing. Neuro: Alert, answering all questions appropriately. Cranial nerves grossly intact.  Skin: No rashes or petechiae noted. Psych: Normal affect.   LAB RESULTS:  Lab Results  Component Value Date   NA 136 08/18/2015   K 3.1* 08/18/2015   CL 107 08/18/2015   CO2 24 08/18/2015   GLUCOSE 92 08/18/2015   BUN 22* 08/18/2015   CREATININE 0.60 08/18/2015   CALCIUM 7.1* 08/18/2015   PROT 4.5* 08/17/2015   ALBUMIN 2.0* 08/17/2015   AST 30 08/17/2015   ALT 33 08/17/2015   ALKPHOS 136* 08/17/2015   BILITOT 1.1 08/17/2015   GFRNONAA >60 08/18/2015   GFRAA >60 08/18/2015    Lab Results  Component Value Date   WBC 5.9 08/18/2015   NEUTROABS 6.1 08/17/2015   HGB 13.2 08/18/2015   HCT 37.2 08/18/2015   MCV 100.0 08/18/2015   PLT 107* 08/18/2015     STUDIES: Mr Jeri Cos [id number] Contrast  Sep 02, 2015  CLINICAL DATA:  Ovarian cancer.  Brain metastases. EXAM: MRI HEAD WITHOUT AND WITH CONTRAST TECHNIQUE: Multiplanar, multiecho pulse sequences of the brain and surrounding structures were obtained without and with intravenous contrast. CONTRAST:  10mL MULTIHANCE GADOBENATE DIMEGLUMINE 529 MG/ML IV SOLN COMPARISON:  MRI brain 05/25/2014 FINDINGS: Sequela of whole brain radiation is noted with diffuse periventricular and subcortical white matter  confluent changes bilaterally. Multiple new enhancing lesions are present, compatible with progression of metastatic disease. Several the previous lesions have decreased in size. A lesion in the right temporal lobe now measures 10 x 18 mm compared with 16 x 21 mm. This is on image 21 of series 10. An inferior left cerebellar lesion measures 11 x 7 mm on image 11 of series 10. It previously measured 21 x 21 mm. An anterior right frontal lesion on image 35 measures 8 x 7 mm. It previously measured 8 x 9 mm. A new lesion in the posterior right frontal lobe on image 39 measures 4.1 mm. A new lesion in the right corona radiata on image 38 measures 5 mm. A new lesion in the anterior right frontal lobe on image 38 measures 4 mm. A right periventricular lesion on image 32 measures 5 mm. A 4 mm right occipital lobe lesion on image 23 is new. Three or 4 clustered foci of enhancement are present posteriorly in the right cerebellum. The largest area measures 4.5 mm on image 15. Extensive white matter changes extend into the brainstem. A remote lacunar infarct is present in the posterior right cerebellum. The internal auditory canals are within normal limits. Flow is present in the major intracranial arteries. The globes and orbits are intact. The paranasal sinuses and mastoid air cells are clear. IMPRESSION: 1. Progression of multiple new enhancing lesions compatible with progressive metastatic disease to the brain. At least 5 new or enlarging lesions are present. 2. Three of the largest previously-seen lesions have decreased in size. 3. Diffuse white matter changes suggest interval brain radiation therapy. Electronically Signed   By: San Morelle M.D.   On: 08/14/2015 16:29   Nm Pet Image Restag (ps) Skull Base To Thigh  September 02, 2015  CLINICAL DATA:  Subsequent treatment strategy for ovarian carcinoma. Cancer tumor marker (CAD -125) has increased recently. EXAM: NUCLEAR MEDICINE PET SKULL BASE TO THIGH TECHNIQUE: 12.5  mCi F-18 FDG was injected intravenously. Full-ring PET imaging was performed from the skull base to thigh after the radiotracer. CT data was obtained and used for attenuation correction and anatomic localization. FASTING BLOOD GLUCOSE:  Value: 76 mg/dl COMPARISON:  PET-CT 06/28/2015 FINDINGS: NECK No hypermetabolic lymph nodes in  the neck. CHEST There is a hypermetabolic focus in the RIGHT upper lobe associated ground-glass opacity (image 84, series 3 no hypermetabolic mediastinal lymph nodes. Bilateral small effusions. ABDOMEN/PELVIS Interval increase in the metabolic activity of small retroperitoneal and periportal lymph nodes. For example nodular lymphoid thickening LEFT of the aorta at the level of the SMA with SUV max equal 8.4. There was a small hypermetabolic nodule lesion on comparison exam which is similar in size with less metabolically active with SUV max equal 3.1. Likewise mildly thickened periportal lymph nodes with this SUV max equal 7.1 (image 158 of fused data set) increased in metabolic activity. Bilateral external iliac lymph nodes are increased mildly metabolic activity and similar in size. For example RIGHT external iliac lymph node with SUV max equal 5.0 (image 235) increased from 3.3. No evidence of hypermetabolic peritoneal disease. No free fluid the abdomen pelvis. No adnexal abnormality SKELETON No focal hypermetabolic activity to suggest skeletal metastasis. IMPRESSION: 1. Concern for mild progression of metastatic adenopathy. Several small retroperitoneal, iliac and periportal periportal lymph nodes are increased in metabolic activity but remains similar in size. 2. No peritoneal nodularity or free fluid. 3. Hypermetabolic ground-glass nodule RIGHT upper lobe is likely inflammatory. Electronically Signed   By: Suzy Bouchard M.D.   On: 08/12/2015 13:49   US Venous Img Lower Bilateral  08/17/2015  CLINICAL DATA:  Bilateral lower extremity pain and edema. History of pulmonary embolism  and malignancy (ovarian cancer and lymphoma). History of varicose veins. Evaluate for DVT. EXAM: BILATERAL LOWER EXTREMITY VENOUS DOPPLER ULTRASOUND TECHNIQUE: Gray-scale sonography with graded compression, as well as color Doppler and duplex ultrasound were performed to evaluate the lower extremity deep venous systems from the level of the common femoral vein and including the common femoral, femoral, profunda femoral, popliteal and calf veins including the posterior tibial, peroneal and gastrocnemius veins when visible. The superficial great saphenous vein was also interrogated. Spectral Doppler was utilized to evaluate flow at rest and with distal augmentation maneuvers in the common femoral, femoral and popliteal veins. COMPARISON:  Bilateral lower extremity venous Doppler ultrasound - 07/21/2014 FINDINGS: RIGHT LOWER EXTREMITY Common Femoral Vein: No evidence of thrombus. Normal compressibility, respiratory phasicity and response to augmentation. Saphenofemoral Junction: No evidence of thrombus. Normal compressibility and flow on color Doppler imaging. Profunda Femoral Vein: No evidence of thrombus. Normal compressibility and flow on color Doppler imaging. Femoral Vein: No evidence of thrombus. Normal compressibility, respiratory phasicity and response to augmentation. Popliteal Vein: No evidence of thrombus. Normal compressibility, respiratory phasicity and response to augmentation. Calf Veins: No evidence of thrombus. Normal compressibility and flow on color Doppler imaging. Superficial Great Saphenous Vein: No evidence of thrombus. Normal compressibility and flow on color Doppler imaging. Venous Reflux:  None. Other Findings:  None. LEFT LOWER EXTREMITY Common Femoral Vein: No evidence of thrombus. Normal compressibility, respiratory phasicity and response to augmentation. Saphenofemoral Junction: No evidence of thrombus. Normal compressibility and flow on color Doppler imaging. Profunda Femoral Vein: No  evidence of thrombus. Normal compressibility and flow on color Doppler imaging. Femoral Vein: No evidence of thrombus. Normal compressibility, respiratory phasicity and response to augmentation. Popliteal Vein: No evidence of thrombus. Normal compressibility, respiratory phasicity and response to augmentation. Calf Veins: No evidence of thrombus. Normal compressibility and flow on color Doppler imaging. Superficial Great Saphenous Vein: No evidence of thrombus. Normal compressibility and flow on color Doppler imaging. Venous Reflux:  None. Other Findings:  None. IMPRESSION: No evidence of DVT within either lower extremity. Electronically Signed  By: Sandi Mariscal M.D.   On: 08/17/2015 11:37    ASSESSMENT: Progressive ovarian cancer with new brain metastasis, declining performance status.  PLAN:    1. Ovarian cancer: Patient's CA-125 continues to increase. Both PET scan and MRI reviewed independently consistent with progressive disease. Patient's performance status continues to decline and she likely can no longer tolerate any chemotherapy. After lengthy discussion with the patient and her husband, they have agreed to hospice services. No further treatment or follow-up is planned. 2. Syncope: Likely multifactorial including dehydration and brain metastasis. Improved after IV fluids, continue steroids as prescribed. 3. Thrombocytopenia: Mild, monitor. 4. Disposition: Discharge home in 1-2 days with hospice.  Appreciate consult, call with questions.     Lloyd Huger, MD   08/18/2015 3:22 PM

## 2015-08-18 NOTE — Consult Note (Signed)
WOC wound consult note Reason for Consult: Nonhealing pressure injury to sacrum (deep tissue injury), coccyx, buttocks.  Stage 2 ruptured serum filled blister to left heel, Stage 2 pressure injury to right heel  Current albumin 2.0  Hospice in for discharge Wound type:Pressure injury Pressure Ulcer POA: Yes Measurement: Deep tissue injury to sacrum and coccyx, present on admission Left buttock, stage 2 pressure injury 3 cm x 1.5 cm x 0.1 cm  Right buttocks stage 2 4 cm x 3 cmx x 0.1 cm  Left heel ruptured serum filled blister  6 cm x 5 cm x 0.1 cm  Wound bed: dark discoloration to sacrococcygeal area Pink and moist otherwise Drainage (amount, consistency, odor) Moderate serosanguinous  No odor.  Periwound:Eccyhomosis to arms and legs Dressing procedure/placement/frequency:Due to low albumin and decreased potential for new tissue growth, will treat conservatively.  Cleanse sacral and buttock wounds with NS and pat gently dry.  Apply vaseline gauze to wound bed.  Cover with 4x4 gauze an ABD pad.   Cleanse bilateral heel ulcers with NS and pat gently dry.  Cover wound beds with vaseline gauze.  Cover with 4x4 gauze and kerlix.   Change daily.  Patient is on mattress replacement with low air loss feature.  Recommend this after discharge at home.  Will not follow at this time.  Please re-consult if needed.  Domenic Moras RN BSN [place] Pager [phone]

## 2015-08-18 NOTE — Progress Notes (Signed)
Patient ID: Amanda Robbins, female   DOB: 09-14-1940, 75 y.o.   MRN: [id number] [facility] PROGRESS NOTE  Amanda Robbins [id number] DOB: 21-Nov-1940 DOA: 08/17/2015 PCP: Rica Mast, MD  HPI/Subjective: Patient does not feel well. Had syncopal episode yesterday. History of ovarian cancer and family is interested in hospice.  Objective: Filed Vitals:   08/17/15 2048 08/18/15 0439  BP: 103/58 114/57  Pulse: 110 121  Temp: 98.4 F (36.9 C) 98.2 F (36.8 C)  Resp: 18 18    Filed Weights   08/17/15 0855  Weight: 62.6 kg (138 lb 0.1 oz)    ROS: Review of Systems  Constitutional: Negative for fever and chills.  Eyes: Negative for blurred vision.  Respiratory: Negative for cough and shortness of breath.   Cardiovascular: Negative for chest pain.  Gastrointestinal: Positive for nausea and abdominal pain. Negative for vomiting, diarrhea and constipation.  Genitourinary: Positive for dysuria.  Musculoskeletal: Negative for joint pain.  Neurological: Negative for dizziness and headaches.   Exam: Physical Exam  Constitutional: She is oriented to person, place, and time.  HENT:  Nose: No mucosal edema.  Mouth/Throat: No oropharyngeal exudate or posterior oropharyngeal edema.  Eyes: Conjunctivae, EOM and lids are normal. Pupils are equal, round, and reactive to light.  Neck: No JVD present. Carotid bruit is not present. No edema present. No thyroid mass and no thyromegaly present.  Cardiovascular: S1 normal and S2 normal.  Tachycardia present.  Exam reveals no gallop.   No murmur heard. Pulses:      Dorsalis pedis pulses are 2+ on the right side, and 2+ on the left side.  Respiratory: No respiratory distress. She has decreased breath sounds in the right lower field and the left lower field. She has no wheezes. She has no rhonchi. She has no rales.  GI: Soft. Bowel sounds are normal. She exhibits distension. There is no tenderness.  Musculoskeletal:       Right  ankle: She exhibits swelling.       Left ankle: She exhibits swelling.  Lymphadenopathy:    She has no cervical adenopathy.  Neurological: She is alert and oriented to person, place, and time. No cranial nerve deficit.  Skin: Skin is warm. Nails show no clubbing.  Patient has heel protector on  Psychiatric: She has a normal mood and affect.      Data Reviewed: Basic Metabolic Panel:  Recent Labs Lab 08/17/15 0928 08/18/15 0403  NA 136 136  K 3.2* 3.1*  CL 106 107  CO2 24 24  GLUCOSE 116* 92  BUN 22* 22*  CREATININE 0.52 0.60  CALCIUM 7.6* 7.1*   Liver Function Tests:  Recent Labs Lab 08/17/15 0928  AST 30  ALT 33  ALKPHOS 136*  BILITOT 1.1  PROT 4.5*  ALBUMIN 2.0*   CBC:  Recent Labs Lab 08/17/15 0928 08/18/15 0403  WBC 6.6 5.9  NEUTROABS 6.1  --   HGB 13.5 13.2  HCT 39.3 37.2  MCV 100.9* 100.0  PLT 119* 107*   Cardiac Enzymes:  Recent Labs Lab 08/17/15 0928  TROPONINI 0.04*    CBG:  Recent Labs Lab 08/12/15 1036  GLUCAP 76    Recent Results (from the past 240 hour(s))  Urine culture     Status: None (Preliminary result)   Collection Time: 08/17/15  9:28 AM  Result Value Ref Range Status   Specimen Description URINE, RANDOM  Final   Special Requests URINE, RANDOM  Final   Culture   Final    >=  100,000 COLONIES/mL GRAM NEGATIVE RODS IDENTIFICATION AND SUSCEPTIBILITIES TO FOLLOW    Report Status PENDING  Incomplete     Studies: US Venous Img Lower Bilateral  08/17/2015  CLINICAL DATA:  Bilateral lower extremity pain and edema. 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 History of varicose veins. Evaluate for DVT. EXAM: BILATERAL LOWER EXTREMITY VENOUS DOPPLER ULTRASOUND TECHNIQUE: Gray-scale sonography with graded compression, as well as color Doppler and duplex ultrasound were performed to evaluate the lower extremity deep venous systems from the level of the common femoral vein and including the common  femoral, femoral, profunda femoral, popliteal and calf veins including the posterior tibial, peroneal and gastrocnemius veins when visible. The superficial great saphenous vein was also interrogated. Spectral Doppler was utilized to evaluate flow at rest and with distal augmentation maneuvers in the common femoral, femoral and popliteal veins. COMPARISON:  Bilateral lower extremity venous Doppler ultrasound - 07/21/2014 FINDINGS: RIGHT LOWER EXTREMITY Common Femoral Vein: No evidence of thrombus. Normal compressibility, respiratory phasicity and response to augmentation. Saphenofemoral Junction: No evidence of thrombus. Normal compressibility and flow on color Doppler imaging. Profunda Femoral Vein: No evidence of thrombus. Normal compressibility and flow on color Doppler imaging. Femoral Vein: No evidence of thrombus. Normal compressibility, respiratory phasicity and response to augmentation. Popliteal Vein: No evidence of thrombus. Normal compressibility, respiratory phasicity and response to augmentation. Calf Veins: No evidence of thrombus. Normal compressibility and flow on color Doppler imaging. Superficial Great Saphenous Vein: No evidence of thrombus. Normal compressibility and flow on color Doppler imaging. Venous Reflux:  None. Other Findings:  None. LEFT LOWER EXTREMITY Common Femoral Vein: No evidence of thrombus. Normal compressibility, respiratory phasicity and response to augmentation. Saphenofemoral Junction: No evidence of thrombus. Normal compressibility and flow on color Doppler imaging. Profunda Femoral Vein: No evidence of thrombus. Normal compressibility and flow on color Doppler imaging. Femoral Vein: No evidence of thrombus. Normal compressibility, respiratory phasicity and response to augmentation. Popliteal Vein: No evidence of thrombus. Normal compressibility, respiratory phasicity and response to augmentation. Calf Veins: No evidence of thrombus. Normal compressibility and flow on color  Doppler imaging. Superficial Great Saphenous Vein: No evidence of thrombus. Normal compressibility and flow on color Doppler imaging. Venous Reflux:  None. Other Findings:  None. IMPRESSION: No evidence of DVT within either lower extremity. Electronically Signed   By: Sandi Mariscal M.D.   On: 08/17/2015 11:37    Scheduled Meds: . acidophilus  1 capsule Oral Daily  . apixaban  5 mg Oral BID  . calcium carbonate  500 mg Oral BID  . dexamethasone  4 mg Oral Daily  . furosemide  20 mg Oral Daily  . nystatin  5 mL Oral QID  . pantoprazole  40 mg Oral Daily  . potassium chloride SA  20 mEq Oral BID  . senna-docusate  1 tablet Oral BID  . topiramate  25 mg Oral Daily   Continuous Infusions: . sodium chloride 75 mL/hr at 08/18/15 0407    Assessment/Plan:  1. Ovarian cancer with progressive metastatic disease. Patient will no longer tolerate any chemotherapy. Patient will go home with hospice services tomorrow. Patient on Decadron for brain metastases. Patient made a DO NOT RESUSCITATE. 2. Syncope. Likely multifactorial. 3. Positive urinary tract infection- give a dose of Rocephin here 4. Thrush- 1 dose of IV Diflucan here and nystatin swish and swallow 5. Hypokalemia and hypomagnesemia replace potassium and magnesium 6. Thrombocytopenia with ovarian cancer 7. History of SVT and tachycardia try to hold off on  medications for this  Code Status:     Code Status Orders        Start     Ordered   08/18/15 1124  Do not attempt resuscitation (DNR)   Continuous    Question Answer Comment  In the event of cardiac or respiratory ARREST Do not call a "code blue"   In the event of cardiac or respiratory ARREST Do not perform Intubation, CPR, defibrillation or ACLS   In the event of cardiac or respiratory ARREST Use medication by any route, position, wound care, and other measures to relive pain and suffering. May use oxygen, suction and manual treatment of airway obstruction as needed for comfort.    Comments nurse may pronounce      08/18/15 1123    Code Status History    Date Active Date Inactive Code Status Order ID Comments User Context   08/17/2015  1:39 PM 08/18/2015 11:23 AM Full Code [id number]  Vaughan Basta, MD Inpatient   05/31/2015  8:21 PM 06/02/2015  5:23 PM Full Code [id number]  Hessie Knows, MD Inpatient   05/29/2015  6:08 PM 05/31/2015  8:21 PM Full Code [id number]  Juluis Mire, MD ED    Advance Directive Documentation        Most Recent Value   Type of Advance Directive  Living will [Should be in previouis file]   Pre-existing out of facility DNR order (yellow form or pink MOST form)     "MOST" Form in Place?       Family Communication: Spoke with family at length. Disposition Plan: Home with hospice tomorrow  Antibiotics:  Rocephin  Time spent: 28 minutes  Loletha Grayer  [facility]

## 2015-08-18 NOTE — Progress Notes (Signed)
Pt using bed pan repeatedly through the night without much output.  Most urine is absorbed into the foam sacral dressing and this dressing was changed x 4 this shift after being saturated with urine.  Wound consult pending.  Request consideration for foley to prevent further breakdown of buttocks and coccyx pressure injuries.  Pt positioned on either hip throughout the night and offloaded from back. Dorna Bloom RN

## 2015-08-18 NOTE — Care Management (Signed)
Admitted to this facility with syncopal episodes. Lives with husband, Inocente Salles, [phone]). Last seen Dr. Loney Hering 2 weeks ago. Dr. Abbe [place] office arranged [place] services through [facility]. No skilled facility. No home oxygen. Non ambulatory x 1.5 weeks per husband. Bedside commode in the home. Husband helps with basic activities of daily living. Several falls in the pat, last one in January (broke a rib). Decreased appetite. No seizure activity. Life Alert in the home.  Spoke with Dr. Grayland Ormond this morning. Mr Bambach desires hospice services in the home. No chemotherapy. Possible palliative radiation.  Discussed Hospice services with Mr. Drucker. Agency list given to Mr. Kostrzewski. [place]. Will update Flo Shanks RN representative for [facility].  Mr. Chiang states he will update [facility]. Shelbie Ammons RN MSN CCM Care Management [phone]

## 2015-08-19 DIAGNOSIS — R55 Syncope and collapse: Secondary | ICD-10-CM | POA: Diagnosis not present

## 2015-08-19 LAB — URINE CULTURE: Culture: >=100000

## 2015-08-19 MED ORDER — CEPHALEXIN 500 MG PO CAPS: 500.0000 mg | ORAL_CAPSULE | Freq: Three times a day (TID) | ORAL | Status: AC

## 2015-08-19 MED ORDER — TOPIRAMATE 25 MG PO TABS: ORAL_TABLET | ORAL | Status: AC

## 2015-08-19 MED ORDER — NYSTATIN 100000 UNIT/ML MT SUSP: 5.0000 mL | Freq: Four times a day (QID) | OROMUCOSAL | Status: AC

## 2015-08-19 MED ORDER — HEPARIN SOD (PORK) LOCK FLUSH 100 UNIT/ML IV SOLN
500.0000 [IU] | Freq: Once | INTRAVENOUS | Status: AC
Start: 2015-08-19 — End: 2015-08-19
  Administered 2015-08-19: 500 [IU] via INTRAVENOUS
  Filled 2015-08-19: qty 5

## 2015-08-19 MED ORDER — MORPHINE SULFATE (CONCENTRATE) 10 MG/0.5ML PO SOLN: 15.0000 mg | ORAL | Status: DC | PRN

## 2015-08-19 NOTE — Care Management (Signed)
Discharge to home today per Dr. Leslye Peer.  Will be followed by [facility]. Transportation will be arranged thru [employer] unit. Family is in agreement with these plans. Shelbie Ammons RN MSN CCM Care Management [phone]

## 2015-08-19 NOTE — Progress Notes (Signed)
EMS called for transport. Troi Florendo S, RN  

## 2015-08-19 NOTE — Discharge Summary (Signed)
[facility] [place] NAME: Amanda Robbins    MR#:  [id number]  DATE OF BIRTH:  Jan 29, 1941  DATE OF ADMISSION:  08/17/2015 ADMITTING PHYSICIAN: Vaughan Basta, MD  DATE OF DISCHARGE: 08/19/2015  1:28 PM  PRIMARY CARE PHYSICIAN: Rica Mast, MD    ADMISSION DIAGNOSIS:  Dehydration [E86.0] UTI (lower urinary tract infection) [N39.0] Elevated troponin I level [R79.89] Syncope, unspecified syncope type [R55]  DISCHARGE DIAGNOSIS:  Principal Problem:   Syncopal episodes Active Problems:   Pressure ulcer   Dehydration   SECONDARY DIAGNOSIS:   Past Medical History  Diagnosis Date  . 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 Irritable bowel   . C. difficile colitis 2011  . Allergic rhinitis     Followed by Dr. Donneta Romberg  . Esophageal reflux     Followed by GI physician, on Dexilant and Ranitidine  . Migraine   . 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 Osteoporosis 2009    on Reclast once yearly, Followed by Dr. Jefm Bryant  . COPD (chronic obstructive pulmonary disease) ([place])   . Thrombocytopenia ([place])   . MVP (mitral valve prolapse)   . SVT (supraventricular tachycardia) ([place])   . Ovarian cancer ([place])     metastasis to brain    HOSPITAL COURSE:   1. Ovarian cancer with progressive metastatic disease. Patient will no longer tolerate any chemotherapy. Patient will go home with hospice today. Patient can continue Decadron. Patient is a DO NOT RESUSCITATE and comfort care measures will be employed. Roxanol prescribed. 2. Syncope likely multifactorial with dehydration, ovarian cancer and urinary tract infection and poor appetite 3. Positive urinary infection. Given dose of Rocephin here few days of Keflex at home. 4. Thrush 1 dose of IV Diflucan and nystatin swish and swallow upon going home. 5. Hypokalemia and hypomagnesemia. Electrolytes were replaced here. I will not replace upon  discharge home. 6. Thrombocytopenia with ovarian cancer 7. History of SVT and tachycardia. I will hold off on medications for this also.  DISCHARGE CONDITIONS:   Guarded  CONSULTS OBTAINED:  [facility]  DRUG ALLERGIES:   Allergies  Allergen Reactions  . Aspirin Other (See Comments)    Other reaction(s): Localized superficial swelling of skin Uncoated asa-causes throat to swell up  . Codeine Itching    Other reaction(s): Itching of Skin  . Fosamax  [Alendronate Sodium]     Other reaction(s): Distress (finding)  . Ibuprofen     Other reaction(s): Distress (finding)  . Metronidazole Nausea And Vomiting  . Naproxen     Other reaction(s): Distress (finding)  . Naproxen Sodium   . Nsaids     GI UPSET / GERD  . Oxycodone Other (See Comments)    Other reaction(s): Diarrhea and vomiting (finding) Patient can tolerate some doses.  . Oxycodone-Acetaminophen Other (See Comments)    Other reaction(s): Diarrhea and vomiting (finding) Patient can tolerate some doses.    DISCHARGE MEDICATIONS:   Discharge Medication List as of 08/19/2015 11:34 AM    START taking these medications   Details  cephALEXin (KEFLEX) 500 MG capsule Take 1 capsule (500 mg total) by mouth 3 (three) times daily., Starting 08/19/2015, Until Discontinued, Print    Morphine Sulfate (MORPHINE CONCENTRATE) 10 MG/0.5ML SOLN concentrated solution Take 0.75 mLs (15 mg total) by mouth every hour as needed for severe pain., Starting 08/19/2015, Until Discontinued, Print    nystatin (MYCOSTATIN) 100000 UNIT/ML suspension Take 5 mLs (500,000  Units total) by mouth 4 (four) times daily., Starting 08/19/2015, Until Discontinued, Print      CONTINUE these medications which have CHANGED   Details  topiramate (TOPAMAX) 25 MG tablet TAKE 1 TABLETS BY MOUTH DAILY, No Print      CONTINUE these medications which have NOT CHANGED   Details  acetaminophen (TYLENOL) 500 MG tablet Take 1 tablet (500 mg total) by mouth every 6  (six) hours as needed for moderate pain., Starting 07/03/2015, Until Sun 07/02/16, Print    dexamethasone (DECADRON) 4 MG tablet Take 1 tablet (4 mg total) by mouth daily., Starting 04/13/2015, Until Discontinued, Normal    omeprazole (PRILOSEC) 20 MG capsule take 1 capsule by mouth twice a day, Normal      STOP taking these medications     Calcium Carbonate (CALCARB 600 PO)      denosumab (PROLIA) 60 MG/ML SOLN injection      DimenhyDRINATE (DRAMAMINE PO)      ELIQUIS 5 MG TABS tablet      furosemide (LASIX) 20 MG tablet      morphine (MSIR) 15 MG tablet      potassium chloride SA (K-DUR,KLOR-CON) 20 MEQ tablet      predniSONE (DELTASONE) 10 MG tablet      Probiotic Product (PRO-BIOTIC BLEND) CAPS      traMADol (ULTRAM) 50 MG tablet      cyclobenzaprine (FLEXERIL) 5 MG tablet      Diphenhyd-Hydrocort-Nystatin (FIRST-DUKES MOUTHWASH) SUSP      lidocaine (LIDODERM) 5 %      phenazopyridine (PYRIDIUM) 200 MG tablet      zolpidem (AMBIEN) 5 MG tablet          DISCHARGE INSTRUCTIONS:   Follow-up with hospice services at home 1 day.  If you experience worsening of your admission symptoms, develop shortness of breath, life threatening emergency, suicidal or homicidal thoughts you must seek medical attention immediately by calling 911 or calling your MD immediately  if symptoms less severe.  You Must read complete instructions/literature along with all the possible adverse reactions/side effects for all the Medicines you take and that have been prescribed to you. Take any new Medicines after you have completely understood and accept all the possible adverse reactions/side effects.   Please note  You were cared for by a hospitalist during your hospital stay. If you have any questions about your discharge medications or the care you received while you were in the hospital after you are discharged, you can call the unit and asked to speak with the hospitalist on call if the  hospitalist that took care of you is not available. Once you are discharged, your primary care physician will handle any further medical issues. Please note that NO REFILLS for any discharge medications will be authorized once you are discharged, as it is imperative that you return to your primary care physician (or establish a relationship with a primary care physician if you do not have one) for your aftercare needs so that they can reassess your need for medications and monitor your lab values.    Today   CHIEF COMPLAINT:   Chief Complaint  Patient presents with  . Loss of Consciousness    HISTORY OF PRESENT ILLNESS:  Amanda Robbins  is a 75 y.o. female with history of metastatic ovarian cancer presented with a syncopal episode   VITAL SIGNS:  Blood pressure 116/73, pulse 75, temperature 98 F (36.7 C), temperature source Oral, resp. rate 18, height 5\' 6"  (1.676  m), weight 62.6 kg (138 lb 0.1 oz), SpO2 95 %.    PHYSICAL EXAMINATION:  GENERAL:  75 y.o.-year-old patient lying in the bed with no acute distress.  EYES: Pupils equal, round, reactive to light and accommodation. No scleral icterus. Extraocular muscles intact.  HEENT: Head atraumatic, normocephalic. Oropharynx and nasopharynx clear.  NECK:  Supple, no jugular venous distention. No thyroid enlargement, no tenderness.  LUNGS: Normal breath sounds bilaterally, no wheezing, rales,rhonchi or crepitation. No use of accessory muscles of respiration.  CARDIOVASCULAR: S1, S2 normal. No murmurs, rubs, or gallops.  ABDOMEN: Soft, generalized tenderness, distended. Bowel sounds present. No organomegaly or mass.  EXTREMITIES: No pedal edema, cyanosis, or clubbing.  NEUROLOGIC: Cranial nerves II through XII are intact. Sensation intact. Gait not checked.  PSYCHIATRIC: The patient is alert and oriented x 3.  SKIN: Heel protectors while in bed.   DATA REVIEW:   CBC  Recent Labs Lab 08/18/15 0403  WBC 5.9  HGB 13.2  HCT 37.2  PLT  107*    Chemistries   Recent Labs Lab 08/17/15 0928 08/18/15 0403  NA 136 136  K 3.2* 3.1*  CL 106 107  CO2 24 24  GLUCOSE 116* 92  BUN 22* 22*  CREATININE 0.52 0.60  CALCIUM 7.6* 7.1*  AST 30  --   ALT 33  --   ALKPHOS 136*  --   BILITOT 1.1  --     Cardiac Enzymes  Recent Labs Lab 08/17/15 0928  TROPONINI 0.04*    Microbiology Results  Results for orders placed or performed during the hospital encounter of 08/17/15  Urine culture     Status: None   Collection Time: 08/17/15  9:28 AM  Result Value Ref Range Status   Specimen Description URINE, RANDOM  Final   Special Requests URINE, RANDOM  Final   Culture >=100,000 COLONIES/mL ENTEROBACTER CLOACAE  Final   Report Status 08/19/2015 FINAL  Final   Organism ID, Bacteria ENTEROBACTER CLOACAE  Final      Susceptibility   Enterobacter cloacae - MIC*    CEFTRIAXONE Value in next row Sensitive      SENSITIVE<=1    CIPROFLOXACIN Value in next row Sensitive      SENSITIVE<=0.25    GENTAMICIN Value in next row Sensitive      SENSITIVE<=1    IMIPENEM Value in next row Sensitive      SENSITIVE0.5    NITROFURANTOIN Value in next row Intermediate      INTERMEDIATE64    TRIMETH/SULFA Value in next row Sensitive      SENSITIVE<=20    PIP/TAZO Value in next row Sensitive      SENSITIVE<=4    CEFAZOLIN Value in next row Resistant      RESISTANT>=64    * >=100,000 COLONIES/mL ENTEROBACTER CLOACAE    Management plans discussed with the patient, family and they are in agreement.  CODE STATUS:  Code Status History    Date Active Date Inactive Code Status Order ID Comments User Context   08/18/2015 11:23 AM 08/19/2015  4:48 PM DNR [id number]  Loletha Grayer, MD Inpatient   08/17/2015  1:39 PM 08/18/2015 11:23 AM Full Code [id number]  Vaughan Basta, MD Inpatient   05/31/2015  8:21 PM 06/02/2015  5:23 PM Full Code [id number]  Hessie Knows, MD Inpatient   05/29/2015  6:08 PM 05/31/2015  8:21 PM Full Code [id number]  Juluis Mire, MD ED    Questions for Most Recent Historical Code Status (Order [id number])  Question Answer Comment   In the event of cardiac or respiratory ARREST Do not call a "code blue"    In the event of cardiac or respiratory ARREST Do not perform Intubation, CPR, defibrillation or ACLS    In the event of cardiac or respiratory ARREST Use medication by any route, position, wound care, and other measures to relive pain and suffering. May use oxygen, suction and manual treatment of airway obstruction as needed for comfort.    Comments nurse may pronounce     Advance Directive Documentation        Most Recent Value   Type of Advance Directive  Living will [Should be in previouis file]   Pre-existing out of facility DNR order (yellow form or pink MOST form)     "MOST" Form in Place?        TOTAL TIME TAKING CARE OF THIS PATIENT: 35 minutes.    Loletha Grayer M.D on 08/19/2015 at 5:09 PM  Between 7am to 6pm - Pager - [phone]  After 6pm go to [email or web address] - password [email or web address] [facility]  Office  [phone]  CC: Primary care physician; Rica Mast, MD

## 2015-08-19 NOTE — Progress Notes (Signed)
Follow up visit made to new referral for [facility] services at home. Mr. Sanandres reports that the bed has been delivered. Questions regarding medications answered. He also asked about ",what to expect" with decline he has seen in his wife. Writer answered his questions and also gave him the "Gone From My [facility] Booklet" and discussed several changes he may see. Mr. Barrionuevo voiced understanding and appreciation. Discharge summary faxed to referral intake. Staff RN Ok Edwards aware that patient is ready for discharge. Thank you. Flo Shanks RN, BSN, [place] and [facility] liaison [phone] c

## 2015-08-19 NOTE — Progress Notes (Signed)
Patient discharged home with hospice via EMS. Madlyn Frankel, RN

## 2015-08-19 NOTE — Progress Notes (Signed)
Pastoral Care and prayer for patient and family.

## 2015-08-20 ENCOUNTER — Encounter: Payer: Medicare Other | Admitting: Occupational Therapy

## 2015-08-20 ENCOUNTER — Telehealth: Payer: Self-pay

## 2015-08-20 NOTE — Telephone Encounter (Signed)
Unable to reach patient for transitional care management.  Will attempt to call again and follow as appropriate.

## 2015-08-20 NOTE — Progress Notes (Signed)
[place]  [phone] [phone]  ID: Amanda Robbins OB: [birth year]  MR#: [id number]  [id number]  Patient Care Team: Jackolyn Confer, MD as PCP - General (Internal Medicine)  CHIEF COMPLAINT:  Chief Complaint  Patient presents with  . Ovarian Cancer    INTERVAL HISTORY: Patient returns to clinic today as an add-on complaining of 10 out of 10 pain particularly in her left foot. She saw primary care recently and was treated for cellulitis and gout. Her left leg is also red and swollen. She continues to have increased weakness and fatigue and her performance status is declining. She denies any fevers. She denies any chest pain or shortness of breath. She has a poor appetite and denies weight loss. She denies any nausea, vomiting, or constipation. Patient offers no further specific complaints.   REVIEW OF SYSTEMS:   Review of Systems  Constitutional: Positive for malaise/fatigue. Negative for fever and weight loss.  Cardiovascular: Positive for leg swelling.  Gastrointestinal: Negative for nausea and diarrhea.  Genitourinary: Negative.   Musculoskeletal: Positive for back pain.  Neurological: Positive for sensory change and weakness.    As per HPI. Otherwise, a complete review of systems is negatve.  PAST MEDICAL HISTORY: Past Medical History  Diagnosis Date  . 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 Irritable bowel   . C. difficile colitis 2011  . Allergic rhinitis     Followed by Dr. Donneta Romberg  . Esophageal reflux     Followed by GI physician, on Dexilant and Ranitidine  . Migraine   . 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 Osteoporosis 2009    on Reclast once yearly, Followed by Dr. Jefm Bryant  . COPD (chronic obstructive pulmonary disease) ([place])   . Thrombocytopenia ([place])   . MVP (mitral valve prolapse)   . SVT (supraventricular tachycardia) ([place])   . Ovarian cancer ([place])     metastasis to  brain    PAST SURGICAL HISTORY: Past Surgical History  Procedure Laterality Date  . Bil roken wrists    . Gallbladder removed    . Bilateral bunionectomies    . Cesarean section    . Abdominal hysterectomy    . Cholecystectomy    . Kyphoplasty N/A 05/31/2015    Procedure: KYPHOPLASTY L-2 and L-4;  Surgeon: Hessie Knows, MD;  Location: [facility] ORS;  Service: Orthopedics;  Laterality: N/A;    FAMILY HISTORY: Unchanged. No reported history of malignancy or chronic disease.     ADVANCED DIRECTIVES:    HEALTH MAINTENANCE: Social History  Substance Use Topics  . Smoking status: Former Smoker    Quit date: 06/12/1989  . Smokeless tobacco: Never Used  . Alcohol Use: No     Colonoscopy:  PAP:  Bone density:  Lipid panel:  Allergies  Allergen Reactions  . Aspirin Other (See Comments)    Other reaction(s): Localized superficial swelling of skin Uncoated asa-causes throat to swell up  . Codeine Itching    Other reaction(s): Itching of Skin  . Fosamax  [Alendronate Sodium]     Other reaction(s): Distress (finding)  . Ibuprofen     Other reaction(s): Distress (finding)  . Metronidazole Nausea And Vomiting  . Naproxen     Other reaction(s): Distress (finding)  . Naproxen Sodium   . Nsaids     GI UPSET / GERD  . Oxycodone Other (See Comments)    Other reaction(s): Diarrhea and vomiting (finding) Patient can tolerate some doses.  Marland Kitchen  Oxycodone-Acetaminophen Other (See Comments)    Other reaction(s): Diarrhea and vomiting (finding) Patient can tolerate some doses.    Current Outpatient Prescriptions  Medication Sig Dispense Refill  . acetaminophen (TYLENOL) 500 MG tablet Take 1 tablet (500 mg total) by mouth every 6 (six) hours as needed for moderate pain. 20 tablet 0  . dexamethasone (DECADRON) 4 MG tablet Take 1 tablet (4 mg total) by mouth daily. 60 tablet 2  . omeprazole (PRILOSEC) 20 MG capsule take 1 capsule by mouth twice a day 60 capsule 5  . cephALEXin (KEFLEX) 500  MG capsule Take 1 capsule (500 mg total) by mouth 3 (three) times daily. 6 capsule 0  . Morphine Sulfate (MORPHINE CONCENTRATE) 10 MG/0.5ML SOLN concentrated solution Take 0.75 mLs (15 mg total) by mouth every hour as needed for severe pain. 42 mL 0  . nystatin (MYCOSTATIN) 100000 UNIT/ML suspension Take 5 mLs (500,000 Units total) by mouth 4 (four) times daily. 60 mL 0  . topiramate (TOPAMAX) 25 MG tablet TAKE 1 TABLETS BY MOUTH DAILY    . [DISCONTINUED] dexlansoprazole (DEXILANT) 60 MG capsule Take 60 mg by mouth daily.       No current facility-administered medications for this visit.    OBJECTIVE: Filed Vitals:   08/09/15 1350  BP: 111/73  Pulse: 114  Temp: 99 F (37.2 C)  Resp: 18     Body mass index is 23.58 kg/(m^2).    ECOG FS:3 - Symptomatic, >50% confined to bed  General: Well-developed, well-nourished, no acute distress.  Eyes: anicteric sclera. Lungs: Clear to auscultation bilaterally. Heart: Regular rate and rhythm. No rubs, murmurs, or gallops. Abdomen: Soft, nontender, nondistended. No organomegaly noted, normoactive bowel sounds. Musculoskeletal: 2-3+ bilateral lower extremity edema. Neuro: Alert, answering all questions appropriately. Cranial nerves grossly intact. Skin: No rashes or petechiae noted. Large 3-4 cm area on left ankle of broken down skin with no obvious erythema. Psych: Normal affect.   LAB RESULTS:  Lab Results  Component Value Date   NA 136 08/18/2015   K 3.1* 08/18/2015   CL 107 08/18/2015   CO2 24 08/18/2015   GLUCOSE 92 08/18/2015   BUN 22* 08/18/2015   CREATININE 0.60 08/18/2015   CALCIUM 7.1* 08/18/2015   PROT 4.5* 08/17/2015   ALBUMIN 2.0* 08/17/2015   AST 30 08/17/2015   ALT 33 08/17/2015   ALKPHOS 136* 08/17/2015   BILITOT 1.1 08/17/2015   GFRNONAA >60 08/18/2015   GFRAA >60 08/18/2015    Lab Results  Component Value Date   WBC 5.9 08/18/2015   NEUTROABS 6.1 08/17/2015   HGB 13.2 08/18/2015   HCT 37.2 08/18/2015   MCV  100.0 08/18/2015   PLT 107* 08/18/2015   Lab Results  Component Value Date   CA125 179.9* 08/03/2015     STUDIES: Mr Jeri Cos [id number] Contrast  August 29, 2015  CLINICAL DATA:  Ovarian cancer.  Brain metastases. EXAM: MRI HEAD WITHOUT AND WITH CONTRAST TECHNIQUE: Multiplanar, multiecho pulse sequences of the brain and surrounding structures were obtained without and with intravenous contrast. CONTRAST:  31mL MULTIHANCE GADOBENATE DIMEGLUMINE 529 MG/ML IV SOLN COMPARISON:  MRI brain 05/25/2014 FINDINGS: Sequela of whole brain radiation is noted with diffuse periventricular and subcortical white matter confluent changes bilaterally. Multiple new enhancing lesions are present, compatible with progression of metastatic disease. Several the previous lesions have decreased in size. A lesion in the right temporal lobe now measures 10 x 18 mm compared with 16 x 21 mm. This is on image 21 of series  10. An inferior left cerebellar lesion measures 11 x 7 mm on image 11 of series 10. It previously measured 21 x 21 mm. An anterior right frontal lesion on image 35 measures 8 x 7 mm. It previously measured 8 x 9 mm. A new lesion in the posterior right frontal lobe on image 39 measures 4.1 mm. A new lesion in the right corona radiata on image 38 measures 5 mm. A new lesion in the anterior right frontal lobe on image 38 measures 4 mm. A right periventricular lesion on image 32 measures 5 mm. A 4 mm right occipital lobe lesion on image 23 is new. Three or 4 clustered foci of enhancement are present posteriorly in the right cerebellum. The largest area measures 4.5 mm on image 15. Extensive white matter changes extend into the brainstem. A remote lacunar infarct is present in the posterior right cerebellum. The internal auditory canals are within normal limits. Flow is present in the major intracranial arteries. The globes and orbits are intact. The paranasal sinuses and mastoid air cells are clear. IMPRESSION: 1. Progression of  multiple new enhancing lesions compatible with progressive metastatic disease to the brain. At least 5 new or enlarging lesions are present. 2. Three of the largest previously-seen lesions have decreased in size. 3. Diffuse white matter changes suggest interval brain radiation therapy. Electronically Signed   By: San Morelle M.D.   On: 08/12/2015 16:29   Nm Pet Image Restag (ps) Skull Base To Thigh  08/12/2015  CLINICAL DATA:  Subsequent treatment strategy for ovarian carcinoma. Cancer tumor marker (CAD -125) has increased recently. EXAM: NUCLEAR MEDICINE PET SKULL BASE TO THIGH TECHNIQUE: 12.5 mCi F-18 FDG was injected intravenously. Full-ring PET imaging was performed from the skull base to thigh after the radiotracer. CT data was obtained and used for attenuation correction and anatomic localization. FASTING BLOOD GLUCOSE:  Value: 76 mg/dl COMPARISON:  PET-CT 06/28/2015 FINDINGS: NECK No hypermetabolic lymph nodes in the neck. CHEST There is a hypermetabolic focus in the RIGHT upper lobe associated ground-glass opacity (image 84, series 3 no hypermetabolic mediastinal lymph nodes. Bilateral small effusions. ABDOMEN/PELVIS Interval increase in the metabolic activity of small retroperitoneal and periportal lymph nodes. For example nodular lymphoid thickening LEFT of the aorta at the level of the SMA with SUV max equal 8.4. There was a small hypermetabolic nodule lesion on comparison exam which is similar in size with less metabolically active with SUV max equal 3.1. Likewise mildly thickened periportal lymph nodes with this SUV max equal 7.1 (image 158 of fused data set) increased in metabolic activity. Bilateral external iliac lymph nodes are increased mildly metabolic activity and similar in size. For example RIGHT external iliac lymph node with SUV max equal 5.0 (image 235) increased from 3.3. No evidence of hypermetabolic peritoneal disease. No free fluid the abdomen pelvis. No adnexal abnormality  SKELETON No focal hypermetabolic activity to suggest skeletal metastasis. IMPRESSION: 1. Concern for mild progression of metastatic adenopathy. Several small retroperitoneal, iliac and periportal periportal lymph nodes are increased in metabolic activity but remains similar in size. 2. No peritoneal nodularity or free fluid. 3. Hypermetabolic ground-glass nodule RIGHT upper lobe is likely inflammatory. Electronically Signed   By: Suzy Bouchard M.D.   On: 08/12/2015 13:49   US Venous Img Lower Bilateral  08/17/2015  CLINICAL DATA:  Bilateral lower extremity pain and edema. 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 History of varicose veins. Evaluate for DVT. EXAM: BILATERAL LOWER EXTREMITY VENOUS DOPPLER  ULTRASOUND TECHNIQUE: Gray-scale sonography with graded compression, as well as color Doppler and duplex ultrasound were performed to evaluate the lower extremity deep venous systems from the level of the common femoral vein and including the common femoral, femoral, profunda femoral, popliteal and calf veins including the posterior tibial, peroneal and gastrocnemius veins when visible. The superficial great saphenous vein was also interrogated. Spectral Doppler was utilized to evaluate flow at rest and with distal augmentation maneuvers in the common femoral, femoral and popliteal veins. COMPARISON:  Bilateral lower extremity venous Doppler ultrasound - 07/21/2014 FINDINGS: RIGHT LOWER EXTREMITY Common Femoral Vein: No evidence of thrombus. Normal compressibility, respiratory phasicity and response to augmentation. Saphenofemoral Junction: No evidence of thrombus. Normal compressibility and flow on color Doppler imaging. Profunda Femoral Vein: No evidence of thrombus. Normal compressibility and flow on color Doppler imaging. Femoral Vein: No evidence of thrombus. Normal compressibility, respiratory phasicity and response to augmentation. Popliteal Vein: No evidence of thrombus.  Normal compressibility, respiratory phasicity and response to augmentation. Calf Veins: No evidence of thrombus. Normal compressibility and flow on color Doppler imaging. Superficial Great Saphenous Vein: No evidence of thrombus. Normal compressibility and flow on color Doppler imaging. Venous Reflux:  None. Other Findings:  None. LEFT LOWER EXTREMITY Common Femoral Vein: No evidence of thrombus. Normal compressibility, respiratory phasicity and response to augmentation. Saphenofemoral Junction: No evidence of thrombus. Normal compressibility and flow on color Doppler imaging. Profunda Femoral Vein: No evidence of thrombus. Normal compressibility and flow on color Doppler imaging. Femoral Vein: No evidence of thrombus. Normal compressibility, respiratory phasicity and response to augmentation. Popliteal Vein: No evidence of thrombus. Normal compressibility, respiratory phasicity and response to augmentation. Calf Veins: No evidence of thrombus. Normal compressibility and flow on color Doppler imaging. Superficial Great Saphenous Vein: No evidence of thrombus. Normal compressibility and flow on color Doppler imaging. Venous Reflux:  None. Other Findings:  None. IMPRESSION: No evidence of DVT within either lower extremity. Electronically Signed   By: Sandi Mariscal M.D.   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  PLAN:    1. Ovarian cancer:  Patient's CA-125 has significantly increased to approximately 180. PET scan and results reviewed independently and reported as above with significant progression of disease including new brain metastasis. Return to clinic as previously scheduled to discuss the results. Given patient's declining performance status additional chemotherapy would be difficult.   2. Peripheral edema: Likely multifactorial.   Continue treatment per lymphedema clinic. 3. Cellulitis: Continue antibiotics as prescribed. 4. Weakness and  fatigue/decreased performance status: Unchanged, monitor. 4. Thrombocytopenia:  Improved since discontinuing chemotherapy.  5. Back pain: Kyphoplasty 2 as above. Treatment per orthopedics. 6. Left ankle wound: Continue current treatment, consider wound care referral if needed.  Approximately 30 minutes was spent in discussion of which greater than 50% was consultation.   Patient expressed understanding and was in agreement with this plan. She also understands that She can call clinic at any time with any questions, concerns, or complaints.    Lloyd Huger, MD   08/20/2015 4:26 PM

## 2015-08-23 ENCOUNTER — Encounter: Payer: Medicare Other | Admitting: Occupational Therapy

## 2015-08-25 ENCOUNTER — Other Ambulatory Visit: Payer: Self-pay | Admitting: *Deleted

## 2015-08-25 ENCOUNTER — Encounter: Payer: Medicare Other | Admitting: Occupational Therapy

## 2015-08-25 MED ORDER — MORPHINE SULFATE (CONCENTRATE) 20 MG/ML PO SOLN: ORAL | Status: AC

## 2015-08-25 NOTE — Telephone Encounter (Signed)
Amanda Robbins patient 

## 2015-08-26 ENCOUNTER — Encounter: Payer: Medicare Other | Admitting: Occupational Therapy

## 2015-08-27 ENCOUNTER — Encounter: Payer: Medicare Other | Admitting: Occupational Therapy

## 2015-08-30 ENCOUNTER — Encounter: Payer: Medicare Other | Admitting: Occupational Therapy

## 2015-08-31 ENCOUNTER — Encounter: Payer: Medicare Other | Admitting: Occupational Therapy

## 2015-09-01 ENCOUNTER — Encounter: Payer: Medicare Other | Admitting: Occupational Therapy

## 2015-09-02 ENCOUNTER — Encounter: Payer: Self-pay | Admitting: Occupational Therapy

## 2015-09-02 DIAGNOSIS — I890 Lymphedema, not elsewhere classified: Secondary | ICD-10-CM

## 2015-09-03 ENCOUNTER — Encounter: Payer: Medicare Other | Admitting: Occupational Therapy

## 2015-09-03 ENCOUNTER — Telehealth: Payer: Self-pay | Admitting: Oncology

## 2015-09-03 NOTE — Telephone Encounter (Signed)
She called to inform you that Amanda Robbins passed away on 2015/09/17 at 12:35.

## 2015-09-06 ENCOUNTER — Encounter: Payer: Medicare Other | Admitting: Occupational Therapy

## 2015-09-08 ENCOUNTER — Encounter: Payer: Medicare Other | Admitting: Occupational Therapy

## 2015-09-10 ENCOUNTER — Encounter: Payer: Medicare Other | Admitting: Occupational Therapy

## 2015-09-11 NOTE — Therapy (Signed)
[place] [facility] [address] [place], [place], [address] Phone: [phone]   Fax:  [phone]  Sep 25, 2015    @[email or web address] @  Occupational Therapy Discharge Summary   Patient: Amanda Robbins MRN: [id number] Date of Birth: [birth year]  Diagnosis: Lymphedema of both lower extremities  Discharge Findings: minimal progress towards goals. Pain and cancer progression are greatest obstacles to clinical gains.  Functional Status at Discharge: dependent for lymphedema care  No Goals Met      Plan - Sep 25, 2015 1001    Clinical Impression Statement Pt is discharged from Occupational Therapy for Lymphedema care of BLE. Pt attended 4 of 36 visits with minimal progress towards goals. Patient determined that she is no longer  able toto attend treatment sessions due to worsening pain, eclining function, and progression of cancer..   Pt will benefit from skilled therapeutic intervention in order to improve on the following deficits (Retired) Abnormal gait;Decreased endurance;Decreased skin integrity;Decreased knowledge of precautions;Improper body mechanics;Decreased activity tolerance;Decreased knowledge of use of DME;Decreased strength;Impaired flexibility;Improper spinal/pelvic alignment;Decreased balance;Decreased mobility;Difficulty walking;Impaired sensation;Increased edema;Pain   Rehab Potential Good   Clinical Impairments Affecting Rehab Potential back pain, fchemo-related fatigue   OT Duration 12 weeks   OT Treatment/Interventions Self-care/ADL training;Energy conservation;Compression bandaging;DME and/or AE instruction;Patient/family education;Therapist, nutritional;Therapeutic exercise;Manual Therapy;Manual lymph drainage;Therapeutic activities   OT Home Exercise Plan LE pumping ther ex   Consulted and Agree with Plan of Care Patient;Family member/caregiver   Family Member Consulted husband, Sam      Sincerely,  Andrey Spearman, MS, OTR/L, [facility] 09/25/2015 10:11 AM  CC @[email or web address] @  [place] [facility] [address] [place], [place], [address] Phone: [phone]   Fax:  [phone]  Patient: Amanda Robbins MRN: [id number] Date of Birth: 03-10-1941

## 2015-09-11 DEATH — deceased

## 2015-09-13 ENCOUNTER — Encounter: Payer: Medicare Other | Admitting: Occupational Therapy

## 2015-09-14 ENCOUNTER — Ambulatory Visit: Payer: Medicare Other | Admitting: Oncology

## 2015-09-14 ENCOUNTER — Other Ambulatory Visit: Payer: Medicare Other

## 2015-09-15 ENCOUNTER — Encounter: Payer: Medicare Other | Admitting: Occupational Therapy

## 2015-09-17 ENCOUNTER — Encounter: Payer: Medicare Other | Admitting: Occupational Therapy

## 2015-09-20 ENCOUNTER — Encounter: Payer: Medicare Other | Admitting: Occupational Therapy

## 2015-09-22 ENCOUNTER — Encounter: Payer: Medicare Other | Admitting: Occupational Therapy

## 2015-09-24 ENCOUNTER — Encounter: Payer: Medicare Other | Admitting: Occupational Therapy

## 2015-09-27 ENCOUNTER — Encounter: Payer: Medicare Other | Admitting: Occupational Therapy

## 2015-09-29 ENCOUNTER — Encounter: Payer: Medicare Other | Admitting: Occupational Therapy

## 2015-10-01 ENCOUNTER — Encounter: Payer: Medicare Other | Admitting: Occupational Therapy

## 2015-10-04 ENCOUNTER — Encounter: Payer: Medicare Other | Admitting: Occupational Therapy

## 2015-10-06 ENCOUNTER — Encounter: Payer: Medicare Other | Admitting: Occupational Therapy

## 2015-10-08 ENCOUNTER — Encounter: Payer: Medicare Other | Admitting: Occupational Therapy

## 2015-10-11 ENCOUNTER — Encounter: Payer: Medicare Other | Admitting: Occupational Therapy

## 2015-10-13 ENCOUNTER — Encounter: Payer: Medicare Other | Admitting: Occupational Therapy

## 2015-10-15 ENCOUNTER — Encounter: Payer: Medicare Other | Admitting: Occupational Therapy

## 2015-10-18 ENCOUNTER — Encounter: Payer: Medicare Other | Admitting: Occupational Therapy

## 2015-10-20 ENCOUNTER — Encounter: Payer: Medicare Other | Admitting: Occupational Therapy

## 2015-10-22 ENCOUNTER — Encounter: Payer: Medicare Other | Admitting: Occupational Therapy

## 2015-12-01 ENCOUNTER — Other Ambulatory Visit: Payer: Self-pay | Admitting: Nurse Practitioner

## 2016-09-28 IMAGING — CR DG C-ARM 61-120 MIN
1 series · 1 of 1 positions shown · non-contrast
Comparison: MRI 05/30/2015

CLINICAL DATA: Two level kyphoplasty.

EXAM:
LUMBAR SPINE - 2-3 VIEW; DG C-ARM 61-120 MIN

[kypho]
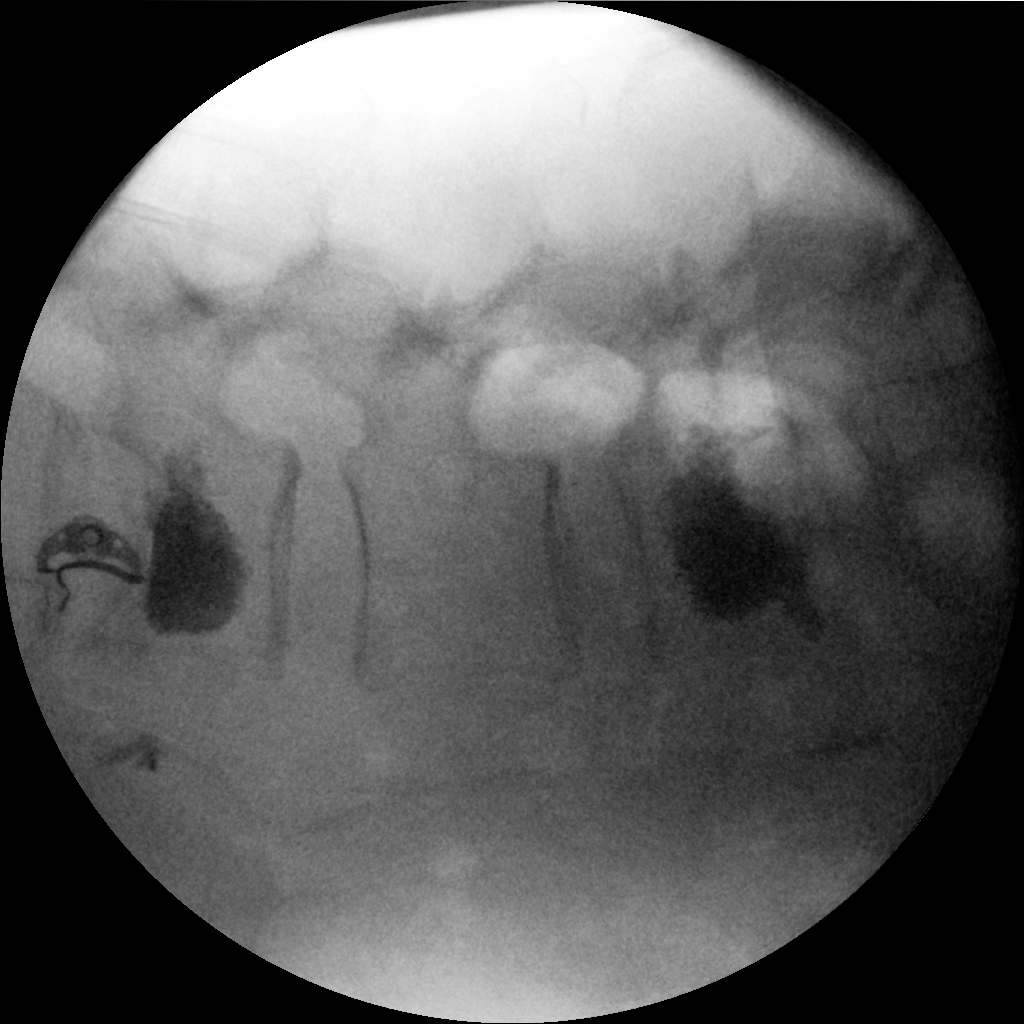

[1 of 1 positions shown; findings below may reference images not displayed]

FINDINGS: Examination demonstrates normal vertebral body alignment. There is
evidence of patient's recent kyphoplasty of L2 and L4. Injected
radiopaque material remains within the confines of the vertebral
body borders. Recommend correlation with findings at the time of the
procedure.
IMPRESSION: Post kyphoplasty of L2 and L4.

## 2016-12-10 IMAGING — MR MR HEAD WO/W CM
9 of 11 series · 39 of 48 positions shown · IV contrast (multihance)
Comparison: MRI brain 05/25/2014

CLINICAL DATA: Ovarian cancer.  Brain metastases.

EXAM:
MRI HEAD WITHOUT AND WITH CONTRAST
TECHNIQUE: Multiplanar, multiecho pulse sequences of the brain and surrounding
structures were obtained without and with intravenous contrast.
CONTRAST:  11mL MULTIHANCE GADOBENATE DIMEGLUMINE 529 MG/ML IV SOLN

[Series 5: T2 · axial · 5.0mm · 0.60mm/px · z∈[-57,+98]mm · 3 of 25 slices shown (1 of 2)]
[im 1/25]
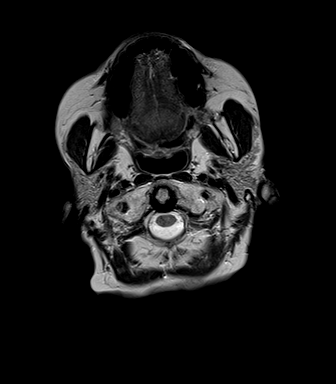
[im 13/25]
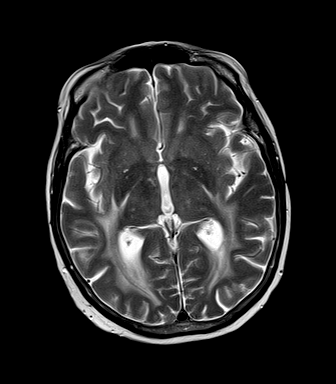
[im 25/25]
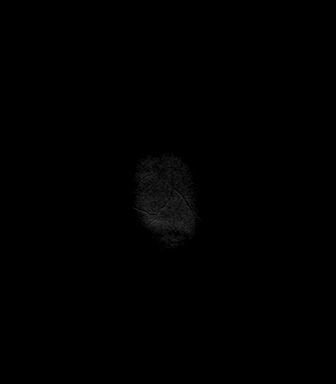

[Series 6: FLAIR · axial · 5.0mm · 0.45mm/px · z∈[-57,+98]mm · 3 of 25 slices shown]
[im 1/25]
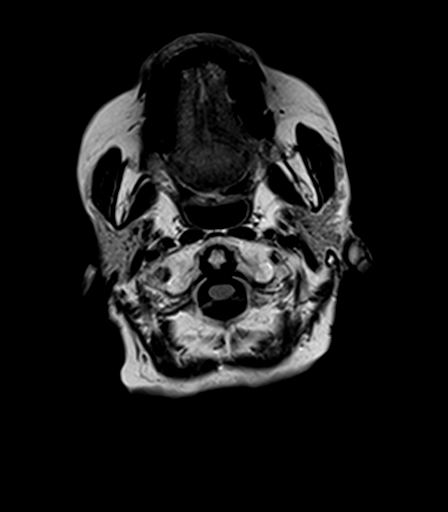
[im 13/25]
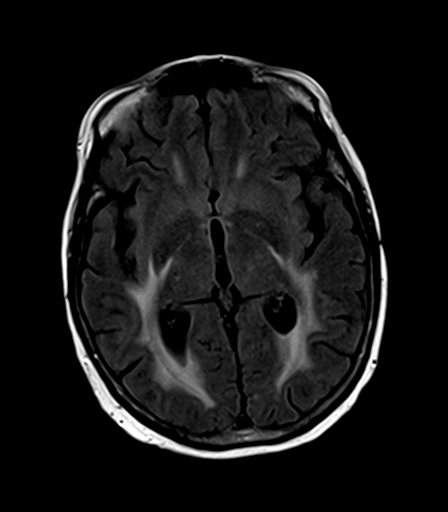
[im 25/25]
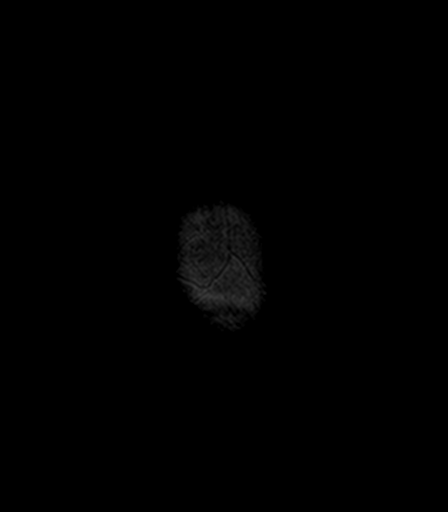

[Series 7: T2 · axial · 5.0mm · 0.45mm/px · z∈[-57,+98]mm · 3 of 25 slices shown (2 of 2)]
[im 1/25]
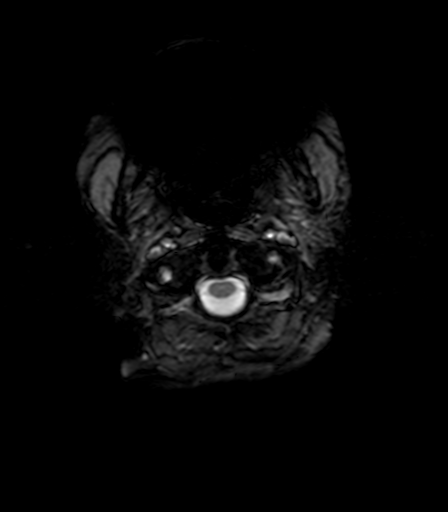
[im 13/25]
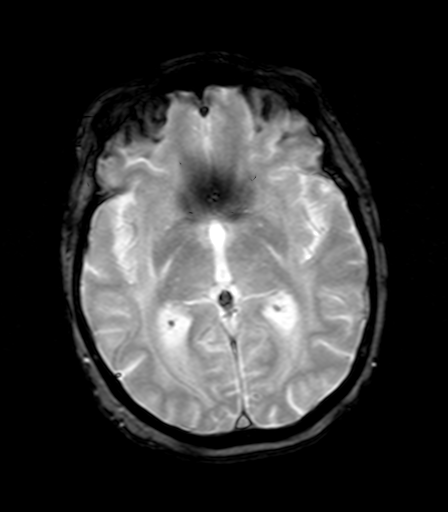
[im 25/25]
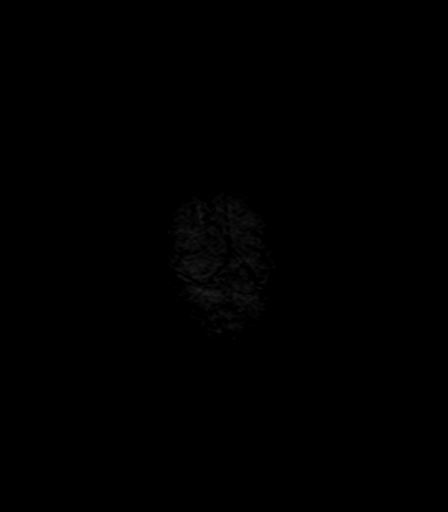

[Series 9: T2 post-contrast · coronal · 5.0mm · 0.49mm/px · 4 of 29 slices shown]
[im 1/29]
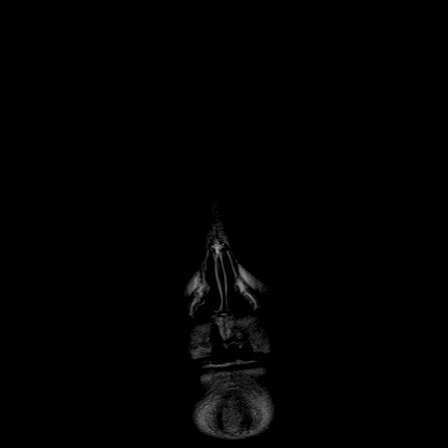
[im 10/29]
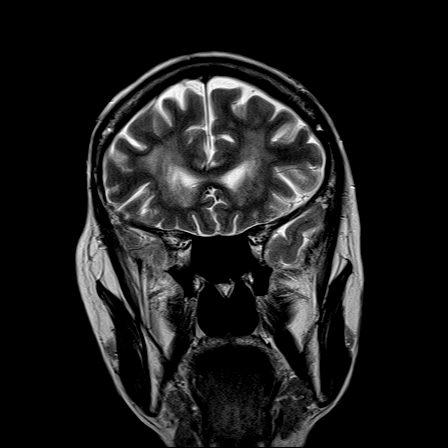
[im 19/29]
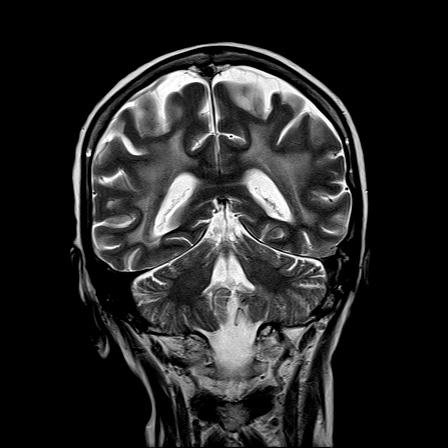
[im 29/29]
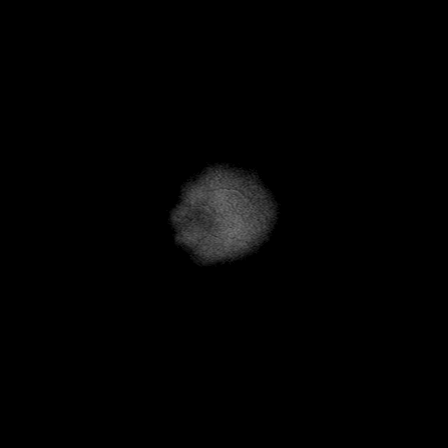

[Series 10: T1 post-contrast · axial · 3.0mm · 1.00mm/px · z∈[-55,+97]mm · 6 of 52 slices shown (1 of 3)]
[im 1/52]
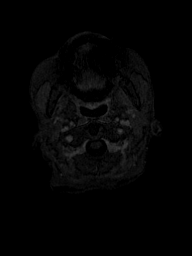
[im 11/52]
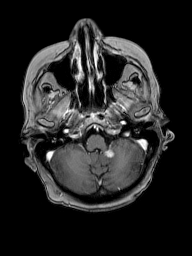
[im 21/52]
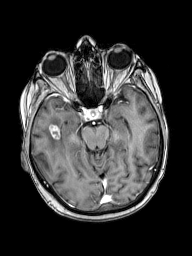
[im 31/52]
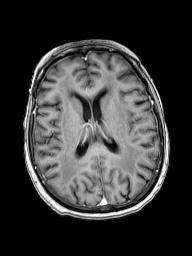
[im 41/52]
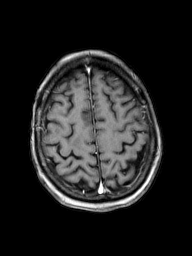
[im 52/52]
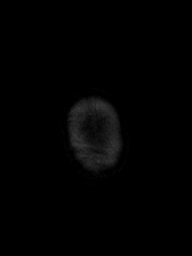

[Series 11: T1 post-contrast · coronal · 5.0mm · 0.43mm/px · 4 of 29 slices shown (2 of 3)]
[im 1/29]
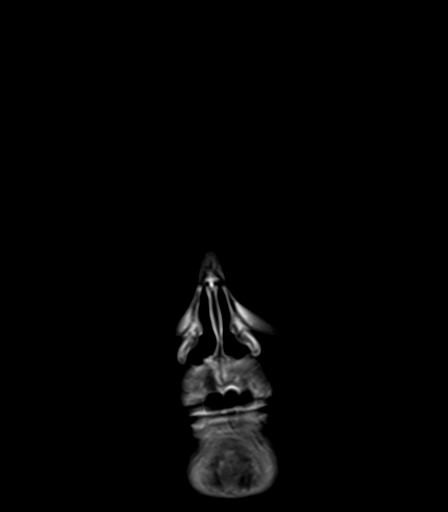
[im 10/29]
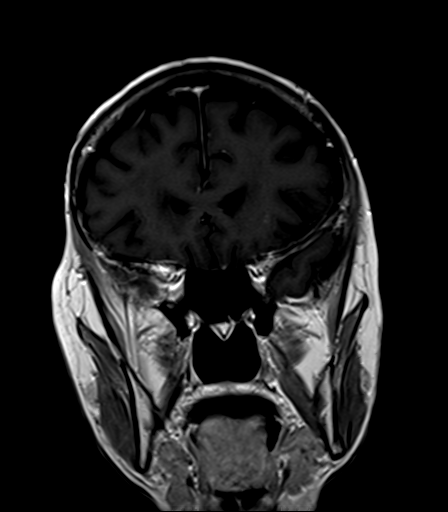
[im 19/29]
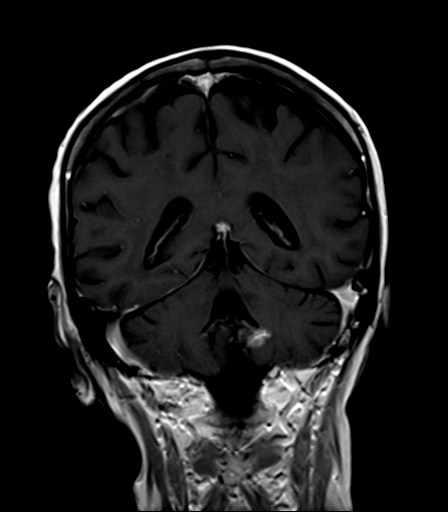
[im 29/29]
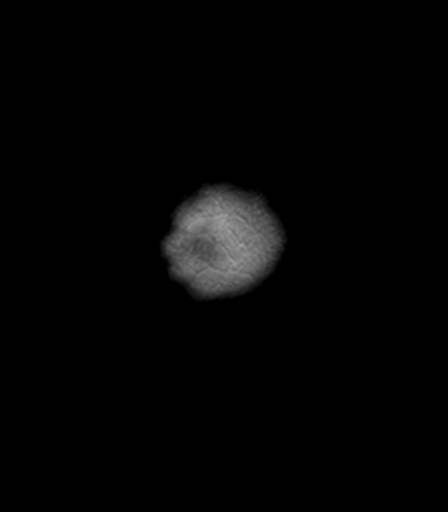

[Series 12: T1 post-contrast · sagittal · 5.0mm · 0.45mm/px · 3 of 25 slices shown (3 of 3)]
[im 1/25]
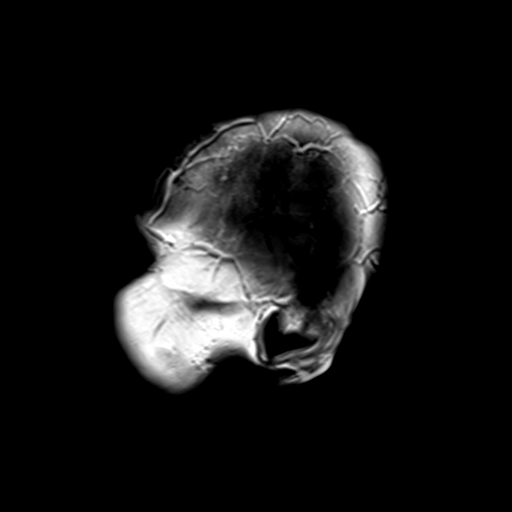
[im 13/25]
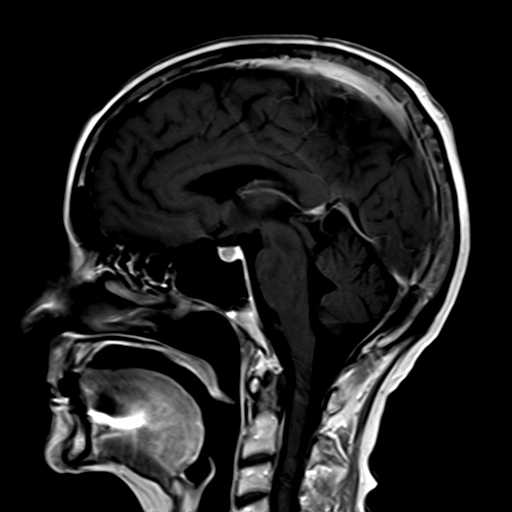
[im 25/25]
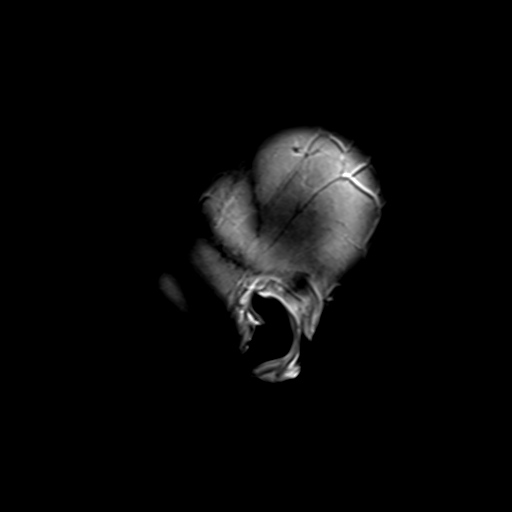

[Series 100: DWI · axial · 3.0mm · 1.80mm/px · z∈[-58,+97]mm · 6 of 51 slices shown]
[im 1/51]
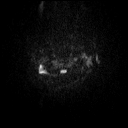
[im 11/51]
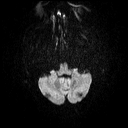
[im 21/51]
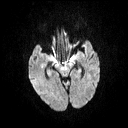
[im 31/51]
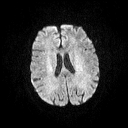
[im 41/51]
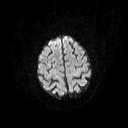
[im 51/51]
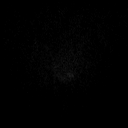

[Series 101: ADC · axial · 3.0mm · 1.80mm/px · z∈[-64,+97]mm · 7 of 53 slices shown]
[im 1/53]
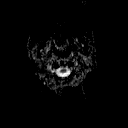
[im 9/53]
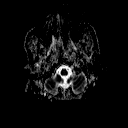
[im 18/53]
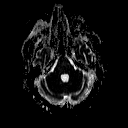
[im 27/53]
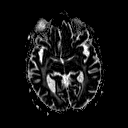
[im 35/53]
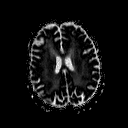
[im 44/53]
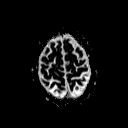
[im 53/53]
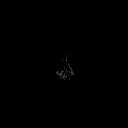

[39 of 48 positions shown; findings below may reference images not displayed]

FINDINGS: Sequela of whole brain radiation is noted with diffuse
periventricular and subcortical white matter confluent changes
bilaterally. Multiple new enhancing lesions are present, compatible
with progression of metastatic disease. Several the previous lesions
have decreased in size.

A lesion in the right temporal lobe now measures 10 x 18 mm compared
with 16 x 21 mm. This is on image 21 of series 10.

An inferior left cerebellar lesion measures 11 x 7 mm on image 11 of
series 10. It previously measured 21 x 21 mm.

An anterior right frontal lesion on image 35 measures 8 x 7 mm. It
previously measured 8 x 9 mm.

A new lesion in the posterior right frontal lobe on image 39
measures 4.1 mm.

A new lesion in the right corona radiata on image 38 measures 5 mm.
A new lesion in the anterior right frontal lobe on image 38 measures
4 mm.

A right periventricular lesion on image 32 measures 5 mm. A 4 mm
right occipital lobe lesion on image 23 is new.

Three or 4 clustered foci of enhancement are present posteriorly in
the right cerebellum. The largest area measures 4.5 mm on image 15.

Extensive white matter changes extend into the brainstem. A remote
lacunar infarct is present in the posterior right cerebellum. The
internal auditory canals are within normal limits. Flow is present
in the major intracranial arteries. The globes and orbits are
intact. The paranasal sinuses and mastoid air cells are clear.
IMPRESSION: 1. Progression of multiple new enhancing lesions compatible with
progressive metastatic disease to the brain. At least 5 new or
enlarging lesions are present.
2. Three of the largest previously-seen lesions have decreased in
size.
3. Diffuse white matter changes suggest interval brain radiation
therapy.

## 2016-12-10 IMAGING — PT NM PET TUM IMG RESTAG (PS) SKULL BASE T - THIGH
1 of 10 series · 1 of 25 positions shown · non-contrast
Comparison: PET-CT 06/28/2015

CLINICAL DATA: Subsequent treatment strategy for ovarian carcinoma.
Cancer tumor marker (CAD -125) has increased recently..

EXAM:
NUCLEAR MEDICINE PET SKULL BASE TO THIGH
TECHNIQUE: 12.5 mCi F-18 FDG was injected intravenously. Full-ring PET imaging
was performed from the skull base to thigh after the radiotracer. CT
data was obtained and used for attenuation correction and anatomic
localization.
FASTING BLOOD GLUCOSE:  Value: 76 mg/dl

[Series 3: ct wb 5.0 b30f · axial · 5.0mm · 0.98mm/px · 1 of 290 slices shown]
[im 290/290  brain]
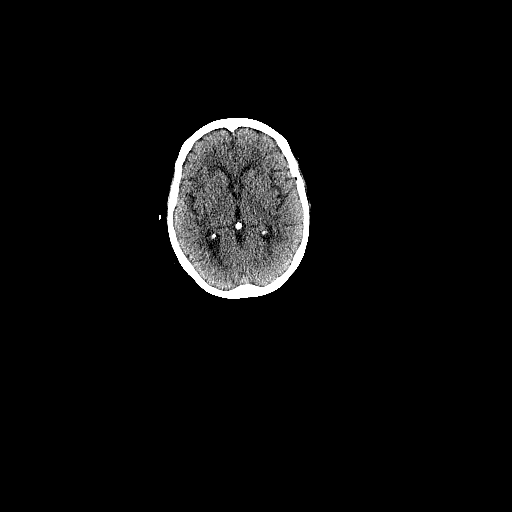

[1 of 25 positions shown; findings below may reference images not displayed]

FINDINGS: NECK

No hypermetabolic lymph nodes in the neck.

CHEST

There is a hypermetabolic focus in the RIGHT upper lobe associated
ground-glass opacity (image 84, series 3 no hypermetabolic
mediastinal lymph nodes. Bilateral small effusions.

ABDOMEN/PELVIS

Interval increase in the metabolic activity of small retroperitoneal
and periportal lymph nodes. For example nodular lymphoid thickening
LEFT of the aorta at the level of the SMA with SUV max equal 8.4.
There was a small hypermetabolic nodule lesion on comparison exam
which is similar in size with less metabolically active with SUV max
equal 3.1.

Likewise mildly thickened periportal lymph nodes with this SUV max
equal 7.1 (image 158 of fused data set) increased in metabolic
activity.

Bilateral external iliac lymph nodes are increased mildly metabolic
activity and similar in size. For example RIGHT external iliac lymph
node with SUV max equal 5.0 (image 235) increased from 3.3. No
evidence of hypermetabolic peritoneal disease. No free fluid the
abdomen pelvis. No adnexal abnormality

SKELETON

No focal hypermetabolic activity to suggest skeletal metastasis.
IMPRESSION: 1. Concern for mild progression of metastatic adenopathy. Several
small retroperitoneal, iliac and periportal periportal lymph nodes
are increased in metabolic activity but remains similar in size.
2. No peritoneal nodularity or free fluid.
3. Hypermetabolic ground-glass nodule RIGHT upper lobe is likely
inflammatory.
